# Patient Record
Sex: Female | Born: 1970 | Race: Black or African American | Hispanic: No | Smoking: Current some day smoker
Health system: Southern US, Community
[De-identification: ages and names within clinical notes are randomized; demographics above are authoritative.]

## PROBLEM LIST (undated history)

## (undated) DIAGNOSIS — I2510 Atherosclerotic heart disease of native coronary artery without angina pectoris: Secondary | ICD-10-CM

## (undated) DIAGNOSIS — Z7901 Long term (current) use of anticoagulants: Secondary | ICD-10-CM

## (undated) DIAGNOSIS — N390 Urinary tract infection, site not specified: Secondary | ICD-10-CM

## (undated) DIAGNOSIS — I255 Ischemic cardiomyopathy: Secondary | ICD-10-CM

## (undated) DIAGNOSIS — I739 Peripheral vascular disease, unspecified: Secondary | ICD-10-CM

## (undated) HISTORY — DX: Ischemic cardiomyopathy: I25.5

## (undated) HISTORY — DX: Atherosclerotic heart disease of native coronary artery without angina pectoris: I25.10

## (undated) HISTORY — DX: Peripheral vascular disease, unspecified: I73.9

---

## 2009-01-09 ENCOUNTER — Emergency Department (HOSPITAL_COMMUNITY): Admission: EM | Admit: 2009-01-09 | Discharge: 2009-01-09 | Payer: Self-pay | Admitting: Emergency Medicine

## 2015-07-31 ENCOUNTER — Inpatient Hospital Stay (HOSPITAL_COMMUNITY)
Admission: EM | Admit: 2015-07-31 | Discharge: 2015-08-04 | DRG: 391 | Disposition: A | Discharge status: Home / Self Care | Payer: Self-pay | Attending: Internal Medicine | Admitting: Internal Medicine

## 2015-07-31 ENCOUNTER — Encounter (HOSPITAL_COMMUNITY): Payer: Self-pay | Admitting: *Deleted

## 2015-07-31 DIAGNOSIS — I255 Ischemic cardiomyopathy: Secondary | ICD-10-CM | POA: Diagnosis present

## 2015-07-31 DIAGNOSIS — Z720 Tobacco use: Secondary | ICD-10-CM | POA: Diagnosis present

## 2015-07-31 DIAGNOSIS — I253 Aneurysm of heart: Secondary | ICD-10-CM | POA: Diagnosis present

## 2015-07-31 DIAGNOSIS — Z6828 Body mass index (BMI) 28.0-28.9, adult: Secondary | ICD-10-CM

## 2015-07-31 DIAGNOSIS — K5289 Other specified noninfective gastroenteritis and colitis: Principal | ICD-10-CM | POA: Diagnosis present

## 2015-07-31 DIAGNOSIS — I252 Old myocardial infarction: Secondary | ICD-10-CM

## 2015-07-31 DIAGNOSIS — I513 Intracardiac thrombosis, not elsewhere classified: Secondary | ICD-10-CM | POA: Diagnosis present

## 2015-07-31 DIAGNOSIS — R1010 Upper abdominal pain, unspecified: Secondary | ICD-10-CM

## 2015-07-31 DIAGNOSIS — R7989 Other specified abnormal findings of blood chemistry: Secondary | ICD-10-CM | POA: Diagnosis present

## 2015-07-31 DIAGNOSIS — R945 Abnormal results of liver function studies: Secondary | ICD-10-CM

## 2015-07-31 DIAGNOSIS — R1013 Epigastric pain: Secondary | ICD-10-CM | POA: Diagnosis present

## 2015-07-31 DIAGNOSIS — F17200 Nicotine dependence, unspecified, uncomplicated: Secondary | ICD-10-CM | POA: Diagnosis present

## 2015-07-31 DIAGNOSIS — E669 Obesity, unspecified: Secondary | ICD-10-CM | POA: Diagnosis present

## 2015-07-31 DIAGNOSIS — I2542 Coronary artery dissection: Secondary | ICD-10-CM | POA: Diagnosis present

## 2015-07-31 DIAGNOSIS — F1290 Cannabis use, unspecified, uncomplicated: Secondary | ICD-10-CM | POA: Diagnosis present

## 2015-07-31 DIAGNOSIS — I2510 Atherosclerotic heart disease of native coronary artery without angina pectoris: Secondary | ICD-10-CM | POA: Diagnosis present

## 2015-07-31 LAB — COMPREHENSIVE METABOLIC PANEL
ALBUMIN: 3.7 g/dL (ref 3.5–5.0)
ALT: 138 U/L — ABNORMAL HIGH (ref 14–54)
ANION GAP: 14 (ref 5–15)
AST: 176 U/L — AB (ref 15–41)
Alkaline Phosphatase: 104 U/L (ref 38–126)
BILIRUBIN TOTAL: 1.1 mg/dL (ref 0.3–1.2)
BUN: 7 mg/dL (ref 6–20)
CALCIUM: 9.6 mg/dL (ref 8.9–10.3)
CO2: 22 mmol/L (ref 22–32)
Chloride: 98 mmol/L — ABNORMAL LOW (ref 101–111)
Creatinine, Ser: 0.71 mg/dL (ref 0.44–1.00)
GFR calc Af Amer: >60 mL/min (ref >60)
GFR calc non Af Amer: >60 mL/min (ref >60)
GLUCOSE: 110 mg/dL — AB (ref 65–99)
Potassium: 3.5 mmol/L (ref 3.5–5.1)
Sodium: 134 mmol/L — ABNORMAL LOW (ref 135–145)
TOTAL PROTEIN: 7.5 g/dL (ref 6.5–8.1)

## 2015-07-31 LAB — LIPASE, BLOOD: LIPASE: 19 U/L (ref 11–51)

## 2015-07-31 LAB — CBC
HCT: 42 % (ref 36.0–46.0)
Hemoglobin: 14.4 g/dL (ref 12.0–15.0)
MCH: 32.5 pg (ref 26.0–34.0)
MCHC: 34.3 g/dL (ref 30.0–36.0)
MCV: 94.8 fL (ref 78.0–100.0)
PLATELETS: 249 10*3/uL (ref 150–400)
RBC: 4.43 MIL/uL (ref 3.87–5.11)
RDW: 13.9 % (ref 11.5–15.5)
WBC: 5.5 10*3/uL (ref 4.0–10.5)

## 2015-07-31 LAB — POC URINE PREG, ED: PREG TEST UR: NEGATIVE

## 2015-07-31 MED ORDER — ONDANSETRON HCL 4 MG/2ML IJ SOLN
4.0000 mg | Freq: Once | INTRAMUSCULAR | Status: AC
  Administered 2015-07-31: 4 mg via INTRAVENOUS
  Filled 2015-07-31: qty 2

## 2015-07-31 MED ORDER — SODIUM CHLORIDE 0.9 % IV BOLUS (SEPSIS)
1000.0000 mL | Freq: Once | INTRAVENOUS | Status: AC
  Administered 2015-07-31: 1000 mL via INTRAVENOUS

## 2015-07-31 MED ORDER — DICYCLOMINE HCL 10 MG/ML IM SOLN
20.0000 mg | Freq: Once | INTRAMUSCULAR | Status: AC
  Administered 2015-07-31: 20 mg via INTRAMUSCULAR
  Filled 2015-07-31: qty 2

## 2015-07-31 NOTE — ED Notes (Signed)
Patient presents with c/o abd pain and emesis

## 2015-07-31 NOTE — ED Provider Notes (Signed)
CSN: [id number]     Arrival date & time 07/31/15  2139 History   First MD Initiated Contact with Patient 07/31/15 2243     Chief Complaint  Patient presents with  . Abdominal Pain  . Emesis     (Consider location/radiation/quality/duration/timing/severity/associated sxs/prior Treatment) HPI Comments: 45 year old female with no significant past medical history presents to the emergency department for evaluation of constant abdominal pain which has been waxing and waning over the past 3 days. She states that pain began on Friday morning and was associated with one episode of emesis. She had 2 episodes of emesis yesterday as well as one episode of emesis this morning. Patient states that she has been having normal bowel movements, the last of which was yesterday. She took an Aleve for symptoms without relief. She describes the pain as aching. Patient reports that her child was sick with the stomach flu a few days ago. She has had no fever, hematemesis, melena, hematochezia, urinary symptoms or vaginal complaints. Abdominal surgical hx significant for C-section x 3.  Patient is a 45 y.o. female presenting with abdominal pain and vomiting. The history is provided by the patient. No language interpreter was used.  Abdominal Pain Associated symptoms: nausea and vomiting   Associated symptoms: no diarrhea, no dysuria and no fever   Emesis Associated symptoms: abdominal pain   Associated symptoms: no diarrhea     History reviewed. No pertinent past medical history. History reviewed. No pertinent past surgical history. No family history on file. Social History  Substance Use Topics  . Smoking status: Current Every Day Smoker  . Smokeless tobacco: Never Used  . Alcohol Use: Yes   OB History    No data available      Review of Systems  Constitutional: Negative for fever.  Gastrointestinal: Positive for nausea, vomiting and abdominal pain. Negative for diarrhea and blood in stool.   Genitourinary: Negative for dysuria.  All other systems reviewed and are negative.   Allergies  Review of patient's allergies indicates no known allergies.  Home Medications   Prior to Admission medications   Medication Sig Start Date End Date Taking? Authorizing Provider  naproxen sodium (ANAPROX) 220 MG tablet Take 220 mg by mouth 2 (two) times daily as needed (for pain).   Yes Historical Provider, MD   BP 139/89 mmHg  Pulse 98  Temp(Src) 98.2 F (36.8 C) (Oral)  Resp 18  Ht 5\' 5"  (1.651 m)  Wt 73.936 kg  BMI 27.12 kg/m2  SpO2 99%  LMP 07/31/2015   Physical Exam  Constitutional: She is oriented to person, place, and time. She appears well-developed and well-nourished. No distress.  Nontoxic appearing  HENT:  Head: Normocephalic and atraumatic.  Eyes: Conjunctivae and EOM are normal. No scleral icterus.  Neck: Normal range of motion.  Cardiovascular: Normal rate, regular rhythm and intact distal pulses.   Patient not tachycardic as noted in triage  Pulmonary/Chest: Effort normal and breath sounds normal. No respiratory distress. She has no wheezes. She has no rales.  Respirations even and unlabored. Lungs clear.  Abdominal: Soft. She exhibits no distension. There is tenderness. There is no rebound.  Soft abdomen with tenderness to palpation in the right upper quadrant and right lower quadrant. Tenderness also noted in the epigastric abdomen. Negative Murphy sign appreciated. No rigidity, guarding, or peritoneal signs.  Musculoskeletal: Normal range of motion.  Neurological: She is alert and oriented to person, place, and time. She exhibits normal muscle tone. Coordination normal.  Skin: Skin  is warm and dry. No rash noted. She is not diaphoretic. No erythema. No pallor.  Psychiatric: She has a normal mood and affect. Her behavior is normal.  Nursing note and vitals reviewed.   ED Course  Procedures (including critical care time) Labs Review Labs Reviewed   COMPREHENSIVE METABOLIC PANEL - Abnormal; Notable for the following:    Sodium 134 (*)    Chloride 98 (*)    Glucose, Bld 110 (*)    AST 176 (*)    ALT 138 (*)    All other components within normal limits  LIPASE, BLOOD  CBC  URINALYSIS, ROUTINE W REFLEX MICROSCOPIC (NOT AT [facility])  POC URINE PREG, ED    Imaging Review Ct Abdomen Pelvis W Contrast  08/01/2015  CLINICAL DATA:  Right-sided flank pain and abdominal pain since Friday. Vomiting for 3 days. Constipation. EXAM: CT ABDOMEN AND PELVIS WITH CONTRAST TECHNIQUE: Multidetector CT imaging of the abdomen and pelvis was performed using the standard protocol following bolus administration of intravenous contrast. CONTRAST:  ISOVUE-300 IOPAMIDOL (ISOVUE-300) INJECTION 61% COMPARISON:  None. FINDINGS: The lung bases are clear. Expansion of the left cardiac apex with thinning in calcification in the wall suggesting left ventricular aneurysm. Focal filling defect consistent with thrombus. Moderate diffuse fatty infiltration of the liver. The gallbladder, pancreas, spleen, adrenal glands, kidneys, abdominal aorta, inferior vena cava, and retroperitoneal lymph nodes are unremarkable. Stomach, small bowel, and colon are not abnormally distended. No free air or free fluid in the abdomen. Abdominal wall musculature appears intact. Pelvis: The appendix is normal. Uterus and ovaries are not enlarged. No free or loculated pelvic fluid collections. No pelvic mass or lymphadenopathy. No destructive bone lesions. IMPRESSION: Left ventricular aneurysm containing thrombus. Diffuse fatty infiltration of the liver. Normal appendix. Electronically Signed   By: Burman Nieves M.D.   On: 08/01/2015 01:03     I have personally reviewed and evaluated these images and lab results as part of my medical decision-making.   EKG Interpretation   Date/Time:  Monday Aug 01 2015 02:44:16 EDT Ventricular Rate:  99 PR Interval:  170 QRS Duration: 81 QT Interval:   378 QTC Calculation: 485 R Axis:   58 Text Interpretation:  Sinus rhythm Probable anterolateral infarct, age  indeterm non-specific ST T changes Confirmed by [facility]  MD, APRIL  ([id number]) on 08/01/2015 2:48:00 AM        EMERGENCY DEPARTMENT BILIARY ULTRASOUND INTERPRETATION "Study: Limited Abdominal Ultrasound of the gallbladder and common bile duct."  INDICATIONS: RUQ pain Indication: Multiple views of the gallbladder and common bile duct were obtained in real-time with a Multi-frequency probe." PERFORMED BY:  Myself with MD Zavitz supervising IMAGES ARCHIVED?: Yes FINDINGS: Gallstones absent, Gallbladder wall normal in thickness and Common bile duct normal in size LIMITATIONS: Bowel Gas INTERPRETATION: Normal   1:54 AM Case discussed with Dr. Toniann Fail who is happy to admit. He requests consult to cardiology to discuss risks/benefits of heparinization given that there is also risk for aneurysm rupture. Will consult cardiology.  2:35 AM Dr. Donnie Aho has seen the patient in the ED in consult  MDM   Final diagnoses:  Left ventricular aneurysm  Left ventricular thrombus (HCC)  Pain of upper abdomen  Elevated LFTs    45 year old female presents to the emergency department for evaluation of abdominal pain and vomiting. She was found to have mildly elevated LFTs with tenderness to palpation in her right upper and right lower quadrants. She also had epigastric tenderness to palpation. Bedside ultrasound performed  with Dr. Jed Limerick. Ultrasound does not show any evidence of cholelithiasis or acute cholecystitis. CT abdomen pelvis ordered for further workup. There is no emergent findings noted in the abdomen or pelvis, though incidental finding of left ventricular aneurysm noted along with thrombus.  Patient to be admitted to the hospitalist service for further management. Cardiology consulted and Dr. Donnie Aho has evaluated the patient at bedside as well.   Filed Vitals:   08/01/15  0130 08/01/15 0145 08/01/15 0200 08/01/15 0230  BP: 144/77 142/96 116/74 128/88  Pulse: 106 98 94 98  Temp:      TempSrc:      Resp:      Height:      Weight:      SpO2: 100% 100% 98% 99%     Antony Madura, PA-C 08/01/15 1610  April Palumbo, MD 08/01/15 [phone]

## 2015-08-01 ENCOUNTER — Observation Stay (HOSPITAL_COMMUNITY): Payer: Self-pay

## 2015-08-01 ENCOUNTER — Emergency Department (HOSPITAL_COMMUNITY): Payer: Self-pay

## 2015-08-01 ENCOUNTER — Encounter (HOSPITAL_COMMUNITY): Payer: Self-pay | Admitting: Radiology

## 2015-08-01 DIAGNOSIS — I513 Intracardiac thrombosis, not elsewhere classified: Secondary | ICD-10-CM | POA: Insufficient documentation

## 2015-08-01 DIAGNOSIS — R945 Abnormal results of liver function studies: Secondary | ICD-10-CM

## 2015-08-01 DIAGNOSIS — F17200 Nicotine dependence, unspecified, uncomplicated: Secondary | ICD-10-CM | POA: Diagnosis present

## 2015-08-01 DIAGNOSIS — R7989 Other specified abnormal findings of blood chemistry: Secondary | ICD-10-CM | POA: Diagnosis present

## 2015-08-01 DIAGNOSIS — R1013 Epigastric pain: Secondary | ICD-10-CM

## 2015-08-01 DIAGNOSIS — R9439 Abnormal result of other cardiovascular function study: Secondary | ICD-10-CM

## 2015-08-01 DIAGNOSIS — Z720 Tobacco use: Secondary | ICD-10-CM | POA: Diagnosis present

## 2015-08-01 DIAGNOSIS — I252 Old myocardial infarction: Secondary | ICD-10-CM

## 2015-08-01 LAB — TROPONIN I
TROPONIN I: 0.05 ng/mL — AB (ref <0.031)
Troponin I: 0.04 ng/mL — ABNORMAL HIGH (ref <0.031)
Troponin I: 0.03 ng/mL (ref <0.031)

## 2015-08-01 LAB — CBC WITH DIFFERENTIAL/PLATELET
Basophils Absolute: 0 10*3/uL (ref 0.0–0.1)
Basophils Relative: 0 %
Eosinophils Absolute: 0.1 10*3/uL (ref 0.0–0.7)
Eosinophils Relative: 2 %
HEMATOCRIT: 38.8 % (ref 36.0–46.0)
HEMOGLOBIN: 13.2 g/dL (ref 12.0–15.0)
LYMPHS ABS: 3.1 10*3/uL (ref 0.7–4.0)
Lymphocytes Relative: 51 %
MCH: 32.3 pg (ref 26.0–34.0)
MCHC: 34 g/dL (ref 30.0–36.0)
MCV: 94.9 fL (ref 78.0–100.0)
MONO ABS: 0.4 10*3/uL (ref 0.1–1.0)
MONOS PCT: 6 %
NEUTROS ABS: 2.4 10*3/uL (ref 1.7–7.7)
NEUTROS PCT: 41 %
Platelets: 217 10*3/uL (ref 150–400)
RBC: 4.09 MIL/uL (ref 3.87–5.11)
RDW: 13.9 % (ref 11.5–15.5)
WBC: 6 10*3/uL (ref 4.0–10.5)

## 2015-08-01 LAB — LIPASE, BLOOD: LIPASE: 27 U/L (ref 11–51)

## 2015-08-01 LAB — BASIC METABOLIC PANEL
ANION GAP: 9 (ref 5–15)
BUN: 6 mg/dL (ref 6–20)
CHLORIDE: 103 mmol/L (ref 101–111)
CO2: 23 mmol/L (ref 22–32)
Calcium: 8.8 mg/dL — ABNORMAL LOW (ref 8.9–10.3)
Creatinine, Ser: 0.72 mg/dL (ref 0.44–1.00)
GFR calc non Af Amer: >60 mL/min (ref >60)
Glucose, Bld: 90 mg/dL (ref 65–99)
Potassium: 4 mmol/L (ref 3.5–5.1)
Sodium: 135 mmol/L (ref 135–145)

## 2015-08-01 LAB — HEPATIC FUNCTION PANEL
ALBUMIN: 3 g/dL — AB (ref 3.5–5.0)
ALT: 109 U/L — AB (ref 14–54)
AST: 151 U/L — AB (ref 15–41)
Alkaline Phosphatase: 86 U/L (ref 38–126)
BILIRUBIN DIRECT: 0.5 mg/dL (ref 0.1–0.5)
BILIRUBIN TOTAL: 1.3 mg/dL — AB (ref 0.3–1.2)
Indirect Bilirubin: 0.8 mg/dL (ref 0.3–0.9)
Total Protein: 6.4 g/dL — ABNORMAL LOW (ref 6.5–8.1)

## 2015-08-01 LAB — HEPARIN LEVEL (UNFRACTIONATED): HEPARIN UNFRACTIONATED: 0.35 [IU]/mL (ref 0.30–0.70)

## 2015-08-01 LAB — I-STAT TROPONIN, ED: Troponin i, poc: 0.04 ng/mL (ref 0.00–0.08)

## 2015-08-01 LAB — RAPID URINE DRUG SCREEN, HOSP PERFORMED
Amphetamines: NOT DETECTED
BENZODIAZEPINES: NOT DETECTED
Barbiturates: NOT DETECTED
Cocaine: NOT DETECTED
Opiates: NOT DETECTED
Tetrahydrocannabinol: POSITIVE — AB

## 2015-08-01 MED ORDER — ONDANSETRON HCL 4 MG/2ML IJ SOLN
4.0000 mg | Freq: Four times a day (QID) | INTRAMUSCULAR | Status: DC | PRN
  Administered 2015-08-02: 4 mg via INTRAVENOUS
  Filled 2015-08-01: qty 2

## 2015-08-01 MED ORDER — METOPROLOL SUCCINATE ER 25 MG PO TB24
25.0000 mg | ORAL_TABLET | Freq: Every day | ORAL | Status: DC
  Administered 2015-08-01 – 2015-08-04 (×4): 25 mg via ORAL
  Filled 2015-08-01 (×4): qty 1

## 2015-08-01 MED ORDER — HEPARIN (PORCINE) IN NACL 100-0.45 UNIT/ML-% IJ SOLN
1000.0000 [IU]/h | INTRAMUSCULAR | Status: DC
  Administered 2015-08-01: 1100 [IU]/h via INTRAVENOUS
  Administered 2015-08-02: 1150 [IU]/h via INTRAVENOUS
  Administered 2015-08-04: 1000 [IU]/h via INTRAVENOUS
  Filled 2015-08-01 (×4): qty 250

## 2015-08-01 MED ORDER — ONDANSETRON HCL 4 MG PO TABS: 4.0000 mg | ORAL_TABLET | Freq: Four times a day (QID) | ORAL | Status: DC | PRN

## 2015-08-01 MED ORDER — ASPIRIN EC 325 MG PO TBEC
325.0000 mg | DELAYED_RELEASE_TABLET | Freq: Every day | ORAL | Status: DC
  Administered 2015-08-01 – 2015-08-04 (×4): 325 mg via ORAL
  Filled 2015-08-01 (×4): qty 1

## 2015-08-01 MED ORDER — ACETAMINOPHEN 325 MG PO TABS
650.0000 mg | ORAL_TABLET | Freq: Four times a day (QID) | ORAL | Status: DC | PRN
Start: 2015-08-01 — End: 2015-08-01

## 2015-08-01 MED ORDER — ACETAMINOPHEN 650 MG RE SUPP: 650.0000 mg | Freq: Four times a day (QID) | RECTAL | Status: DC | PRN

## 2015-08-01 MED ORDER — IOPAMIDOL (ISOVUE-300) INJECTION 61%
INTRAVENOUS | Status: AC
  Administered 2015-08-01: 100 mL
  Filled 2015-08-01: qty 100

## 2015-08-01 MED ORDER — PANTOPRAZOLE SODIUM 40 MG IV SOLR
40.0000 mg | INTRAVENOUS | Status: DC
Start: 2015-08-01 — End: 2015-08-03
  Administered 2015-08-01 – 2015-08-03 (×3): 40 mg via INTRAVENOUS
  Filled 2015-08-01 (×4): qty 40

## 2015-08-01 MED ORDER — HEPARIN BOLUS VIA INFUSION
3000.0000 [IU] | Freq: Once | INTRAVENOUS | Status: AC
  Administered 2015-08-01: 3000 [IU] via INTRAVENOUS
  Filled 2015-08-01: qty 3000

## 2015-08-01 NOTE — ED Notes (Signed)
Pt speaking on phone 

## 2015-08-01 NOTE — ED Notes (Signed)
Attempoted to call report  

## 2015-08-01 NOTE — ED Notes (Signed)
Patient transported to CT 

## 2015-08-01 NOTE — Progress Notes (Signed)
  Aug 01, 2015  Patient: [name]  Date of Birth: 1970/12/12  Date of Visit: 07/31/2015    To Whom It May Concern:  Teigan Carver was admitted to the hospital on 07/31/2015. [name] is Casey [name]'s mother.   Sincerely,  Kandice Hams, RN

## 2015-08-01 NOTE — ED Notes (Signed)
Portable chest xray at bedside.

## 2015-08-01 NOTE — Progress Notes (Signed)
Patient refused bed alarm.    

## 2015-08-01 NOTE — Consult Note (Signed)
Cardiology Consult Note  Admit date: 07/31/2015 Name: Casey [name] 45 y.o.  female DOB:  Sep 24, 1970 MRN:  [id number]  Today's date:  08/01/2015  Referring Physician:   [facility]  Reason for Consultation:    Incidental finding of cardiac aneurysm on CT scan   IMPRESSIONS: 1.  Evidence of anterior myocardial infarction by EKG which appears old by EKG and also confirmed by CT scan done for abdominal pain showing a thin calcified apex with what appears to be thrombus in it.  She has absolutely no history of a clinical event that could've been a previous infarction and her lifetime.  She normally feels well without cardiovascular symptoms.  The troponin that is low points against a recent event 2.  Tobacco abuse 3.  Mild obesity 4.  Abdominal pain with elevated liver enzymes and right upper quadrant tenderness possible cholecystitis  RECOMMENDATION: 1.  She has what looks like a laminar thrombus on her CT scan.  It's reasonable to heparinize her this evening and we will look at  an echo tomorrow. 2.  Go ahead and cycle cardiac enzymes although I suspect this is an old event because of the calcification that is seen 3.  Low-dose aspirin.  Continue to work  abdominal pain and follow liver enzymes. 4.  Begin low-dose beta blocker in the morning and await echo.    HISTORY: This 45 year old female presented to the emergency room with a three-day history of the abdominal pain which is waxed and waned over the past 3 days.  The pain began Friday and was associated with emesis.  She had vomiting yesterday and again today with the last episode being around 3 PM.  She has taken Aleve and describes an aching symptoms worsen the right upper quadrant.  Her child evidently had stomach flu a few days ago.  She had no chest pressure and did not have any shortness of breath.  She has not had any prior history lifetime of any symptoms that would suggest previous infarction.  She normally is able  to work and denies any symptoms of chest pain angina or shortness of breath.  History reviewed. No pertinent past medical history.    Past Surgical History  Procedure Laterality Date  . Cesarean section      Allergies:  has No Known Allergies.   Medications: Prior to Admission medications   Medication Sig Start Date End Date Taking? Authorizing Provider  naproxen sodium (ANAPROX) 220 MG tablet Take 220 mg by mouth 2 (two) times daily as needed (for pain).   Yes Historical Provider, MD   Family History: Family Status  Relation Status Death Age  . Father      Health unknown  . Mother Deceased 52    Asthma  . Brother Alive   . Brother Alive   . Brother Alive   . Sister Alive   . Sister Alive    Social History:   reports that she has been smoking.  She has never used smokeless tobacco. She reports that she drinks alcohol. She reports that she does not use illicit drugs.   Divorced, has 4 children and one still lives with her with her 2 grandkids.  She works at [employer] work  Review of Systems: Other than as noted above the remainder of the review of systems is unremarkable  Physical Exam: BP 128/88 mmHg  Pulse 98  Temp(Src) 98.2 F (36.8 C) (Oral)  Resp 18  Ht  (1.651  m)  Wt 73.936 kg (163 lb)  BMI 27.12 kg/m2  SpO2 99%  LMP 07/31/2015  General appearance: She is a mildly obese black female yawning frequently not in any acute distress Head: Normocephalic, without obvious abnormality, atraumatic Eyes: conjunctivae/corneas clear. PERRL, EOM's intact. Fundi not examined  Neck: no adenopathy, no carotid bruit, no JVD and supple, symmetrical, trachea midline Lungs: clear to auscultation bilaterally Heart: regular rate and rhythm, S1, S2 normal, no murmur, click, rub or gallop Abdomen: Thousand's preserved, right upper quadrant point tenderness is present Pelvic: deferred Extremities: extremities normal, atraumatic, no cyanosis or edema Pulses:  2+ and symmetric Skin: Skin color, texture, turgor normal. No rashes or lesions Neurologic: Grossly normal   Labs: CBC  Recent Labs  07/31/15 2201  WBC 5.5  RBC 4.43  HGB 14.4  HCT 42.0  PLT 249  MCV 94.8  MCH 32.5  MCHC 34.3  RDW 13.9   CMP   Recent Labs  07/31/15 2201  NA 134*  K 3.5  CL 98*  CO2 22  GLUCOSE 110*  BUN 7  CREATININE 0.71  CALCIUM 9.6  PROT 7.5  ALBUMIN 3.7  AST 176*  ALT 138*  ALKPHOS 104  BILITOT 1.1  GFRNONAA >60  GFRAA >60   BNP (last 3 results) Cardiac Panel (last 3 results) Troponin (Point of Care Test)  Recent Labs  08/01/15 0216  TROPIPOC 0.04     Radiology: CT scan of the pelvis shows an incidental finding of a calcified LV aneurysm with what appears to be a laminated thrombus in it.  EKG: Sinus rhythm with an old anterior infarction  Signed:  W. Ashley Royalty MD [facility]   Cardiology Consultant  08/01/2015, 3:14 AM

## 2015-08-01 NOTE — Progress Notes (Signed)
ANTICOAGULATION CONSULT NOTE Pharmacy Consult for Heparin Indication: LV thrombus  No Known Allergies  Patient Measurements: Height: 5\' 4"  (162.6 cm) Weight: 163 lb 3.2 oz (74.027 kg) IBW/kg (Calculated) : 54.7 Heparin Dosing Weight: 70 kg  Vital Signs: Temp: 98.2 F (36.8 C) (05/01 1157) Temp Source: Oral (05/01 1157) BP: 124/66 mmHg (05/01 1157) Pulse Rate: 83 (05/01 1157)  Labs:  Recent Labs  07/31/15 2201 08/01/15 0735 08/01/15 1144 08/01/15 1226  HGB 14.4 13.2  --   --   HCT 42.0 38.8  --   --   PLT 249 217  --   --   HEPARINUNFRC  --   --   --  0.35  CREATININE 0.71 0.72  --   --   TROPONINI  --  0.04* 0.03  --     Estimated Creatinine Clearance: 88.4 mL/min (by C-G formula based on Cr of 0.72).   Medical History: History reviewed. No pertinent past medical history.  Medications:  Naproxen   Assessment: 45 y.o. female with abdominal pain, foound to have LV aneurysm/throbus, for heparin Initial HL = 0.35  Goal of Therapy:  Heparin level 0.3-0.7 units/ml Monitor platelets by anticoagulation protocol: Yes   Plan:  Increase heparin to 1200 units / hr Follow up AM labs  Thank you Okey Regal, PharmD [phone] 08/01/2015,1:25 PM

## 2015-08-01 NOTE — Progress Notes (Signed)
Patient Name: Casey FRANZEL Date of Encounter: 08/01/2015  Principal Problem:   Epigastric pain Active Problems:   Old anterior myocardial infarction   Tobacco abuse   Elevated LFTs   LV (left ventricular) mural thrombus [facility])   Primary Cardiologist: New  Patient Profile: 45 yo female w/ no known CRFs was admitted 05/01 w/ abd pain, cards seeing for abnl abd CT > calcified LV aneurysm with what appears to be a laminated thrombus. Echo ordered, ez neg so far.   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 Pt took ASA, did not seek medical attention. Otherwise, no hx CP/SOB  OBJECTIVE Filed Vitals:   08/01/15 0415 08/01/15 0430 08/01/15 0600 08/01/15 0630  BP: 126/86 125/87 116/76 116/84  Pulse: 83 97 91 90  Temp:      TempSrc:      Resp: 24 17 16 17   Height:      Weight:      SpO2: 100% 97% 97% 100%    Intake/Output Summary (Last 24 hours) at 08/01/15 0801 Last data filed at 08/01/15 0153  Gross per 24 hour  Intake   1000 ml  Output      0 ml  Net   1000 ml   Filed Weights   07/31/15 2149  Weight: 163 lb (73.936 kg)    PHYSICAL EXAM General: Well developed, well nourished, female in no acute distress. Head: Normocephalic, atraumatic.  Neck: Supple without bruits, JVD. Lungs:  Resp regular and unlabored, CTA. Heart: RRR, S1, S2, no S3, S4, or murmur; no rub. Abdomen: Soft, non-tender, non-distended, BS + x 4.  Extremities: No clubbing, cyanosis, edema.  Neuro: Alert and oriented X 3. Moves all extremities spontaneously. Psych: Normal affect.  LABS: CBC: Recent Labs  07/31/15 2201  WBC 5.5  HGB 14.4  HCT 42.0  MCV 94.8  PLT 249   INR:No results for input(s): INR in the last 72 hours. Basic Metabolic Panel: Recent Labs  07/31/15 2201  NA 134*  K 3.5  CL 98*  CO2 22  GLUCOSE 110*  BUN 7  CREATININE 0.71  CALCIUM 9.6   Liver Function Tests: Recent Labs  07/31/15 2201  AST  176*  ALT 138*  ALKPHOS 104  BILITOT 1.1  PROT 7.5  ALBUMIN 3.7    Recent Labs  08/01/15 0216  TROPIPOC 0.04    Radiology/Studies: US Abdomen Complete  08/01/2015  CLINICAL DATA:  Abdominal pain and elevated liver function tests. EXAM: ABDOMEN ULTRASOUND COMPLETE COMPARISON:  CT abdomen and pelvis Aug 01, 2015 at 0045 hours FINDINGS: Gallbladder: No gallstones or wall thickening visualized. No sonographic Murphy sign noted by sonographer. Common bile duct: Diameter: 4 mm Liver: Diffusely echogenic without intrahepatic biliary dilatation. Hepatopetal portal vein. IVC: No abnormality visualized. Pancreas: Visualized portion unremarkable. Spleen: Size and appearance within normal limits. Right Kidney: Length: 10.6 cm. Echogenicity within normal limits. No mass or hydronephrosis visualized. Left Kidney: Length: 10.3 cm. Echogenicity within normal limits. No mass or hydronephrosis visualized. Abdominal aorta: No aneurysm visualized. Other findings: None. IMPRESSION: Hepatic steatosis.  Otherwise negative abdominal ultrasound. Electronically Signed   By: Awilda Metro M.D.   On: 08/01/2015 05:20   Ct Abdomen Pelvis W Contrast  08/01/2015  CLINICAL DATA:  Right-sided flank pain and abdominal pain since Friday. Vomiting for 3 days. Constipation. EXAM: CT ABDOMEN AND PELVIS WITH CONTRAST TECHNIQUE: Multidetector CT imaging of the abdomen and pelvis was performed using  the standard protocol following bolus administration of intravenous contrast. CONTRAST:  ISOVUE-300 IOPAMIDOL (ISOVUE-300) INJECTION 61% COMPARISON:  None. FINDINGS: The lung bases are clear. Expansion of the left cardiac apex with thinning in calcification in the wall suggesting left ventricular aneurysm. Focal filling defect consistent with thrombus. Moderate diffuse fatty infiltration of the liver. The gallbladder, pancreas, spleen, adrenal glands, kidneys, abdominal aorta, inferior vena cava, and retroperitoneal lymph nodes are  unremarkable. Stomach, small bowel, and colon are not abnormally distended. No free air or free fluid in the abdomen. Abdominal wall musculature appears intact. Pelvis: The appendix is normal. Uterus and ovaries are not enlarged. No free or loculated pelvic fluid collections. No pelvic mass or lymphadenopathy. No destructive bone lesions. IMPRESSION: Left ventricular aneurysm containing thrombus. Diffuse fatty infiltration of the liver. Normal appendix. Electronically Signed   By: Burman Nieves M.D.   On: 08/01/2015 01:03   Dg Chest Port 1 View  08/01/2015  CLINICAL DATA:  Abdominal pain and vomiting. EXAM: PORTABLE CHEST 1 VIEW COMPARISON:  None. FINDINGS: The heart size and mediastinal contours are within normal limits. Both lungs are clear. The visualized skeletal structures are unremarkable. IMPRESSION: No active disease. Electronically Signed   By: Burman Nieves M.D.   On: 08/01/2015 05:55     Current Medications:  . aspirin EC  325 mg Oral Daily  . metoprolol succinate  25 mg Oral Daily  . pantoprazole (PROTONIX) IV  40 mg Intravenous Q24H   . heparin 1,100 Units/hr (08/01/15 0440)    ASSESSMENT AND PLAN:  1.   LV (left ventricular) mural thrombus (HCC) - await echo, continue heparin, ASA, BB  Otherwise, per IM Principal Problem:   Epigastric pain Active Problems:   Old anterior myocardial infarction   Tobacco abuse   Elevated LFTs    Signed, Barrett, Rhonda , PA-C 8:01 AM 08/01/2015 As above, patient seen and examined. She presented with epigastric and right upper quadrant pain. CT showed mural thrombus at the LV apex. Her electrocardiogram suggests prior anterior infarct. Plan echocardiogram today. If LV function is abnormal she will likely require cardiac catheterization. Continue heparin. Her enzymes do not suggest a recent event. Her presenting symptoms seem most consistent with cholecystitis. Further  Evaluation and management per primary care. Olga Millers

## 2015-08-01 NOTE — Progress Notes (Signed)
Attemped report back to nurse. My callback number is [phone].

## 2015-08-01 NOTE — ED Notes (Signed)
Patient transported to Ultrasound 

## 2015-08-01 NOTE — Progress Notes (Signed)
ANTICOAGULATION CONSULT NOTE - Initial Consult  Pharmacy Consult for Heparin Indication: LV thrombus  No Known Allergies  Patient Measurements: Height: 5\' 5"  (165.1 cm) Weight: 163 lb (73.936 kg) IBW/kg (Calculated) : 57 Heparin Dosing Weight: 70 kg  Vital Signs: Temp: 98.2 F (36.8 C) (04/30 2146) Temp Source: Oral (04/30 2146) BP: 128/88 mmHg (05/01 0230) Pulse Rate: 98 (05/01 0230)  Labs:  Recent Labs  07/31/15 2201  HGB 14.4  HCT 42.0  PLT 249  CREATININE 0.71    Estimated Creatinine Clearance: 90.4 mL/min (by C-G formula based on Cr of 0.71).   Medical History: History reviewed. No pertinent past medical history.  Medications:  Naproxen   Assessment: 45 y.o. female with abdominal pain, foound to have LV aneurysm/throbus, for heparin  Goal of Therapy:  Heparin level 0.3-0.7 units/ml Monitor platelets by anticoagulation protocol: Yes   Plan:  Heparin 3000 units IV bolus, then start heparin 1100 units/hr Check heparin level in 6 hours.   Eddie Candle 08/01/2015,4:04 AM

## 2015-08-01 NOTE — H&P (Addendum)
History and Physical    Casey [name] KYH:[id number] DOB: July 05, 1970 DOA: 07/31/2015  Referring MD/NP/PA: Ms. Antony Madura. PCP: No primary care provider on file.  Outpatient Specialists: None. Patient coming from: Home.  Chief Complaint: Epigastric pain.  HPI: Casey [name] is a 45 y.o. female with medical history significant of with no significant past medical history presents to the ER because of epigastric pain. Patient has been having epigastric pain with nausea and vomiting over the last 3 days. Patient tried to eat and every time patient has been having some nausea at times with vomiting. Denies any diarrhea fever chills. Denies any chest pain or shortness of breath. CT of the abdomen and pelvis done in the ER shows LV thrombus with aneurysm. On-call cardiologist Dr. Tresa Endo was consulted for the LV thrombus and aneurysm. Patient's labs also revealed elevated LFTs. Patient states on arrival patient had epigastric tenderness and on my exam tenderness is at this time resolved after patient received medications in the ER. Patient will be admitted for further management of the epigastric pain with elevated LFTs and LV thrombus.   ED Course: Patient is a pain relief medications in the ER. Was seen by cardiologist.  Review of Systems: As per HPI otherwise 10 point review of systems negative.    History reviewed. No pertinent past medical history.  Past Surgical History  Procedure Laterality Date  . Cesarean section       reports that she has been smoking.  She has never used smokeless tobacco. She reports that she drinks alcohol. She reports that she does not use illicit drugs.  No Known Allergies  Family History  Problem Relation Age of Onset  . Diabetes Mellitus II Neg Hx   . CAD Neg Hx     Prior to Admission medications   Medication Sig Start Date End Date Taking? Authorizing Provider  naproxen sodium (ANAPROX) 220 MG tablet Take 220 mg by mouth 2 (two) times daily  as needed (for pain).   Yes Historical Provider, MD    Physical Exam: Filed Vitals:   08/01/15 0345 08/01/15 0400 08/01/15 0415 08/01/15 0430  BP: 128/83 126/79 126/86 125/87  Pulse: 82 94 83 97  Temp:      TempSrc:      Resp: 20 17 24 17   Height:      Weight:      SpO2: 99% 97% 100% 97%      Constitutional: NAD, calm, comfortable Filed Vitals:   08/01/15 0345 08/01/15 0400 08/01/15 0415 08/01/15 0430  BP: 128/83 126/79 126/86 125/87  Pulse: 82 94 83 97  Temp:      TempSrc:      Resp: 20 17 24 17   Height:      Weight:      SpO2: 99% 97% 100% 97%   Eyes: PERRL, lids and conjunctivae normal ENMT: Mucous membranes are moist. Posterior pharynx clear of any exudate or lesions.Normal dentition.  Neck: normal, supple, no masses, no thyromegaly.No JVD appreciated. Respiratory: clear to auscultation bilaterally, no wheezing, no crackles. Normal respiratory effort. No accessory muscle use.  Cardiovascular: Regular rate and rhythm, no murmurs / rubs / gallops. No extremity edema. 2+ pedal pulses. No carotid bruits.  Abdomen: no tenderness, no masses palpated. No hepatosplenomegaly. Bowel sounds positive.  Musculoskeletal: no clubbing / cyanosis. No joint deformity upper and lower extremities. Good ROM, no contractures. Normal muscle tone.  Skin: no rashes, lesions, ulcers. No induration Neurologic: CN 2-12 grossly intact. Sensation intact, DTR  normal. Strength 5/5 in all 4.  Psychiatric: Normal judgment and insight. Alert and oriented x 3. Normal mood.    Labs on Admission: I have personally reviewed following labs and imaging studies  CBC:  Recent Labs Lab 07/31/15 2201  WBC 5.5  HGB 14.4  HCT 42.0  MCV 94.8  PLT 249   Basic Metabolic Panel:  Recent Labs Lab 07/31/15 2201  NA 134*  K 3.5  CL 98*  CO2 22  GLUCOSE 110*  BUN 7  CREATININE 0.71  CALCIUM 9.6   GFR: Estimated Creatinine Clearance: 90.4 mL/min (by C-G formula based on Cr of 0.71). Liver Function  Tests:  Recent Labs Lab 07/31/15 2201  AST 176*  ALT 138*  ALKPHOS 104  BILITOT 1.1  PROT 7.5  ALBUMIN 3.7    Recent Labs Lab 07/31/15 2201  LIPASE 19   No results for input(s): AMMONIA in the last 168 hours. Coagulation Profile: No results for input(s): INR, PROTIME in the last 168 hours. Cardiac Enzymes: No results for input(s): CKTOTAL, CKMB, CKMBINDEX, TROPONINI in the last 168 hours. BNP (last 3 results) No results for input(s): PROBNP in the last 8760 hours. HbA1C: No results for input(s): HGBA1C in the last 72 hours. CBG: No results for input(s): GLUCAP in the last 168 hours. Lipid Profile: No results for input(s): CHOL, HDL, LDLCALC, TRIG, CHOLHDL, LDLDIRECT in the last 72 hours. Thyroid Function Tests: No results for input(s): TSH, T4TOTAL, FREET4, T3FREE, THYROIDAB in the last 72 hours. Anemia Panel: No results for input(s): VITAMINB12, FOLATE, FERRITIN, TIBC, IRON, RETICCTPCT in the last 72 hours. Urine analysis: No results found for: COLORURINE, APPEARANCEUR, LABSPEC, PHURINE, GLUCOSEU, HGBUR, BILIRUBINUR, KETONESUR, PROTEINUR, UROBILINOGEN, NITRITE, LEUKOCYTESUR Sepsis Labs: (procalcitonin:4,lacticidven:4) )No results found for this or any previous visit (from the past 240 hour(s)).   Radiological Exams on Admission: Ct Abdomen Pelvis W Contrast  08/01/2015  CLINICAL DATA:  Right-sided flank pain and abdominal pain since Friday. Vomiting for 3 days. Constipation. EXAM: CT ABDOMEN AND PELVIS WITH CONTRAST TECHNIQUE: Multidetector CT imaging of the abdomen and pelvis was performed using the standard protocol following bolus administration of intravenous contrast. CONTRAST:  ISOVUE-300 IOPAMIDOL (ISOVUE-300) INJECTION 61% COMPARISON:  None. FINDINGS: The lung bases are clear. Expansion of the left cardiac apex with thinning in calcification in the wall suggesting left ventricular aneurysm. Focal filling defect consistent with thrombus. Moderate  diffuse fatty infiltration of the liver. The gallbladder, pancreas, spleen, adrenal glands, kidneys, abdominal aorta, inferior vena cava, and retroperitoneal lymph nodes are unremarkable. Stomach, small bowel, and colon are not abnormally distended. No free air or free fluid in the abdomen. Abdominal wall musculature appears intact. Pelvis: The appendix is normal. Uterus and ovaries are not enlarged. No free or loculated pelvic fluid collections. No pelvic mass or lymphadenopathy. No destructive bone lesions. IMPRESSION: Left ventricular aneurysm containing thrombus. Diffuse fatty infiltration of the liver. Normal appendix. Electronically Signed   By: Burman Nieves M.D.   On: 08/01/2015 01:03    EKG: Independently reviewed. Normal sinus rhythm with poor R-wave progression.  Assessment/Plan Principal Problem:   Epigastric pain Active Problems:   Old anterior myocardial infarction   Tobacco abuse   Elevated LFTs   LV (left ventricular) mural thrombus (HCC)    #1. Epigastric pain with elevated LFTs - CT scan of the abdomen shows normal gallbladder and pancreas. At this time we will repeat LFTs check acute hepatitis panel, INR. I will also place patient on Protonix and check abdominal sonogram. Check  LFTs are getting elevated may need MRCP or GI consult. We'll also be setting cardiac markers to rule out cardiac etiology given the LV thrombus. #2. LV aneurysm with thrombus - appreciate cardiology consult. As requested by cardiologist I have started patient on heparin infusion and aspirin. Patient is also on metoprolol placed by cardiologist. We will cycle cardiac markers check 2-D echo. #3. Tobacco abuse - advised to quit smoking.  Chest x-ray is pending.   DVT prophylaxis: Heparin infusion. Code Status: Full code.  Family Communication: No family at the bedside.  Disposition Plan: Home.  Consults called: Cardiologist.  Admission status: Observation. 23 hours.    Eduard Clos  MD [facility] Pager [phone].  If 7PM-7AM, please contact night-coverage [email or web address] Password [facility]  08/01/2015, 4:47 AM

## 2015-08-01 NOTE — Progress Notes (Signed)
PROGRESS NOTE                                                                                                                                                                                                             Patient Demographics:    Casey [name], is a 45 y.o. female, DOB - 11/11/1970, WUJ:[id number]  Admit date - 07/31/2015   Admitting Physician Eduard Clos, MD  Outpatient Primary MD for the patient is No primary care provider on file.  LOS -   Outpatient Specialists:none  Chief Complaint  Patient presents with  . Abdominal Pain  . Emesis       Brief Narrative    45 year old female with no prior medical history presents to the ED with epigastric pain associated with nausea and vomiting for the past 3 days. Patient was nauseous every time she ate with some episodes of vomiting. The pain was epigastric radiating to bilateral upper quadrants and then to the back. Denies eating outside or similar episodes in the past. No fevers, chills or diarrhea. Denied chest shortness of breath. Patient was found to have elevated AST and AST in high 100s. CT of the abdomen and pelvis in the ED showed LV thrombus with aneurysm. Admitted to hospital service on telemetry. Started on heparin drip as per cardiology recommendations.   Subjective:   Patient seen and examined. Admission H&P reviewed. Denies further epigastric pain, nausea or vomiting.   Assessment  & Plan :    Principal Problem:   Epigastric pain with elevated LFTs Suspect acute gastroenteritis/viral hepatitis. LFTs slowly trending down this morning. Imaging (CT and ultrasound) does not show any gallstones or positive Murphy sign. Lipase normal. Denies being on any medications or use of OCPs. Check hepatitis panel. If symptoms persistent or LFTs worsened will obtain HIDA scan versus MRCP. Continue PPI. Symptoms now resolved. Monitor with regular  diet.  LV aneurysm with thrombus Incidental finding. No other symptoms. Appreciate cardiology evaluation. 2d echo ordered. Started on IV heparin and beta blocker. Had mildly elevated troponin (0.04), no EKG shows prior anterior infarct. As per cardiology if LV function abnormal she'll likely need cardiac catheterization.    Active Problems: Tobacco abuse Counseled on cessation        Code Status : Full code  Family Communication  : None  Disposition Plan  :  Admit to telemetry. Patient be discharged home eventually  Barriers For Discharge : Active symptoms  Consults  : Cardiology  Procedures  :  CT abdomen pelvis Ultrasound abdomen 2-D echo (pending)  DVT Prophylaxis  : IV heparin  Lab Results  Component Value Date   PLT 217 08/01/2015    Antibiotics  :  None  Anti-infectives    None        Objective:   Filed Vitals:   08/01/15 1049 08/01/15 1100 08/01/15 1115 08/01/15 1157  BP: 116/92 114/71 115/79 124/66  Pulse: 96 91 91 83  Temp:    98.2 F (36.8 C)  TempSrc:    Oral  Resp: 12 16 15 18   Height:    5\' 4"  (1.626 m)  Weight:    74.027 kg (163 lb 3.2 oz)  SpO2: 99% 100% 100% 98%    Wt Readings from Last 3 Encounters:  08/01/15 74.027 kg (163 lb 3.2 oz)     Intake/Output Summary (Last 24 hours) at 08/01/15 1326 Last data filed at 08/01/15 0153  Gross per 24 hour  Intake   1000 ml  Output      0 ml  Net   1000 ml     Physical Exam  Gen: not in distress HEENT: no pallor, No icterus, moist mucosa, supple neck Chest: clear b/l, no added sounds CVS: N S1&S2, no murmurs, rubs or gallop GI: soft, NT, ND, BS+ Musculoskeletal: warm, no edema CNS: AAOX3, non focal    Data Review:    CBC  Recent Labs Lab 07/31/15 2201 08/01/15 0735  WBC 5.5 6.0  HGB 14.4 13.2  HCT 42.0 38.8  PLT 249 217  MCV 94.8 94.9  MCH 32.5 32.3  MCHC 34.3 34.0  RDW 13.9 13.9  LYMPHSABS  --  3.1  MONOABS  --  0.4  EOSABS  --  0.1  BASOSABS  --  0.0     Chemistries   Recent Labs Lab 07/31/15 2201 08/01/15 0735  NA 134* 135  K 3.5 4.0  CL 98* 103  CO2 22 23  GLUCOSE 110* 90  BUN 7 6  CREATININE 0.71 0.72  CALCIUM 9.6 8.8*  AST 176* 151*  ALT 138* 109*  ALKPHOS 104 86  BILITOT 1.1 1.3*   ------------------------------------------------------------------------------------------------------------------ No results for input(s): CHOL, HDL, LDLCALC, TRIG, CHOLHDL, LDLDIRECT in the last 72 hours.  No results found for: HGBA1C ------------------------------------------------------------------------------------------------------------------ No results for input(s): TSH, T4TOTAL, T3FREE, THYROIDAB in the last 72 hours.  Invalid input(s): FREET3 ------------------------------------------------------------------------------------------------------------------ No results for input(s): VITAMINB12, FOLATE, FERRITIN, TIBC, IRON, RETICCTPCT in the last 72 hours.  Coagulation profile No results for input(s): INR, PROTIME in the last 168 hours.  No results for input(s): DDIMER in the last 72 hours.  Cardiac Enzymes  Recent Labs Lab 08/01/15 0735 08/01/15 1144  TROPONINI 0.04* 0.03   ------------------------------------------------------------------------------------------------------------------ No results found for: BNP  Inpatient Medications  Scheduled Meds: . aspirin EC  325 mg Oral Daily  . metoprolol succinate  25 mg Oral Daily  . pantoprazole (PROTONIX) IV  40 mg Intravenous Q24H   Continuous Infusions: . heparin 1,100 Units/hr (08/01/15 0440)   PRN Meds:.acetaminophen **OR** acetaminophen, ondansetron **OR** ondansetron (ZOFRAN) IV  Micro Results No results found for this or any previous visit (from the past 240 hour(s)).  Radiology Reports US Abdomen Complete  08/01/2015  CLINICAL DATA:  Abdominal pain and elevated liver function tests. EXAM: ABDOMEN ULTRASOUND COMPLETE COMPARISON:  CT abdomen and pelvis Aug 01, 2015  at 0045 hours FINDINGS: Gallbladder: No gallstones or wall thickening visualized. No sonographic Murphy sign noted by sonographer. Common bile duct: Diameter: 4 mm Liver: Diffusely echogenic without intrahepatic biliary dilatation. Hepatopetal portal vein. IVC: No abnormality visualized. Pancreas: Visualized portion unremarkable. Spleen: Size and appearance within normal limits. Right Kidney: Length: 10.6 cm. Echogenicity within normal limits. No mass or hydronephrosis visualized. Left Kidney: Length: 10.3 cm. Echogenicity within normal limits. No mass or hydronephrosis visualized. Abdominal aorta: No aneurysm visualized. Other findings: None. IMPRESSION: Hepatic steatosis.  Otherwise negative abdominal ultrasound. Electronically Signed   By: Awilda Metro M.D.   On: 08/01/2015 05:20   Ct Abdomen Pelvis W Contrast  08/01/2015  CLINICAL DATA:  Right-sided flank pain and abdominal pain since Friday. Vomiting for 3 days. Constipation. EXAM: CT ABDOMEN AND PELVIS WITH CONTRAST TECHNIQUE: Multidetector CT imaging of the abdomen and pelvis was performed using the standard protocol following bolus administration of intravenous contrast. CONTRAST:  ISOVUE-300 IOPAMIDOL (ISOVUE-300) INJECTION 61% COMPARISON:  None. FINDINGS: The lung bases are clear. Expansion of the left cardiac apex with thinning in calcification in the wall suggesting left ventricular aneurysm. Focal filling defect consistent with thrombus. Moderate diffuse fatty infiltration of the liver. The gallbladder, pancreas, spleen, adrenal glands, kidneys, abdominal aorta, inferior vena cava, and retroperitoneal lymph nodes are unremarkable. Stomach, small bowel, and colon are not abnormally distended. No free air or free fluid in the abdomen. Abdominal wall musculature appears intact. Pelvis: The appendix is normal. Uterus and ovaries are not enlarged. No free or loculated pelvic fluid collections. No pelvic mass or lymphadenopathy. No  destructive bone lesions. IMPRESSION: Left ventricular aneurysm containing thrombus. Diffuse fatty infiltration of the liver. Normal appendix. Electronically Signed   By: Burman Nieves M.D.   On: 08/01/2015 01:03   Dg Chest Port 1 View  08/01/2015  CLINICAL DATA:  Abdominal pain and vomiting. EXAM: PORTABLE CHEST 1 VIEW COMPARISON:  None. FINDINGS: The heart size and mediastinal contours are within normal limits. Both lungs are clear. The visualized skeletal structures are unremarkable. IMPRESSION: No active disease. Electronically Signed   By: Burman Nieves M.D.   On: 08/01/2015 05:55    Time Spent in minutes  25   Eddie North M.D on 08/01/2015 at 1:26 PM  Between 7am to 7pm - Pager - [phone]  After 7pm go to [email or web address] - password [facility]  [facility] -  Office  [phone]

## 2015-08-02 ENCOUNTER — Inpatient Hospital Stay (HOSPITAL_COMMUNITY): Payer: MEDICAID

## 2015-08-02 ENCOUNTER — Other Ambulatory Visit (HOSPITAL_COMMUNITY): Payer: Self-pay

## 2015-08-02 DIAGNOSIS — I252 Old myocardial infarction: Secondary | ICD-10-CM

## 2015-08-02 DIAGNOSIS — I213 ST elevation (STEMI) myocardial infarction of unspecified site: Secondary | ICD-10-CM

## 2015-08-02 DIAGNOSIS — R7989 Other specified abnormal findings of blood chemistry: Secondary | ICD-10-CM

## 2015-08-02 LAB — HEPATIC FUNCTION PANEL
ALBUMIN: 2.9 g/dL — AB (ref 3.5–5.0)
ALK PHOS: 81 U/L (ref 38–126)
ALT: 91 U/L — AB (ref 14–54)
AST: 94 U/L — AB (ref 15–41)
BILIRUBIN TOTAL: 1.1 mg/dL (ref 0.3–1.2)
Bilirubin, Direct: 0.3 mg/dL (ref 0.1–0.5)
Indirect Bilirubin: 0.8 mg/dL (ref 0.3–0.9)
TOTAL PROTEIN: 6.1 g/dL — AB (ref 6.5–8.1)

## 2015-08-02 LAB — CBC
HEMATOCRIT: 36.2 % (ref 36.0–46.0)
HEMOGLOBIN: 12.3 g/dL (ref 12.0–15.0)
MCH: 31.4 pg (ref 26.0–34.0)
MCHC: 34 g/dL (ref 30.0–36.0)
MCV: 92.3 fL (ref 78.0–100.0)
Platelets: 222 10*3/uL (ref 150–400)
RBC: 3.92 MIL/uL (ref 3.87–5.11)
RDW: 13.8 % (ref 11.5–15.5)
WBC: 5.7 10*3/uL (ref 4.0–10.5)

## 2015-08-02 LAB — ECHOCARDIOGRAM COMPLETE
Height: 64 in
WEIGHTICAEL: 2608 [oz_av]

## 2015-08-02 LAB — HEPATITIS PANEL, ACUTE
HCV Ab: <0.1 s/co ratio (ref 0.0–0.9)
Hep A IgM: NEGATIVE
Hep B C IgM: NEGATIVE
Hepatitis B Surface Ag: NEGATIVE

## 2015-08-02 LAB — HEPARIN LEVEL (UNFRACTIONATED): HEPARIN UNFRACTIONATED: 0.72 [IU]/mL — AB (ref 0.30–0.70)

## 2015-08-02 MED ORDER — PERFLUTREN LIPID MICROSPHERE
1.0000 mL | INTRAVENOUS | Status: AC | PRN
  Administered 2015-08-02: 2 mL via INTRAVENOUS
  Filled 2015-08-02: qty 10

## 2015-08-02 NOTE — Progress Notes (Signed)
PROGRESS NOTE                                                                                                                                                                                                             Patient Demographics:    Casey [name], is a 45 y.o. female, DOB - [birth year], ZOX:[id number]  Admit date - 07/31/2015   Admitting Physician Eduard Clos, MD  Outpatient Primary MD for the patient is No primary care provider on file.  LOS - 1  Outpatient Specialists:none  Chief Complaint  Patient presents with  . Abdominal Pain  . Emesis       Brief Narrative    45 year old female with no prior medical history presents to the ED with epigastric pain associated with nausea and vomiting for the past 3 days. Patient was nauseous every time she ate with some episodes of vomiting. The pain was epigastric radiating to bilateral upper quadrants and then to the back. Denies eating outside or similar episodes in the past. No fevers, chills or diarrhea. Denied chest shortness of breath. Patient was found to have elevated AST and AST in high 100s. CT of the abdomen and pelvis in the ED showed LV thrombus with aneurysm.  Started on heparin drip as per cardiology recommendations. 2 D echo shows low EF of 35-40% with mild diffuse hypokinesis with akinesis of the mid-apical anterior wall, mid-apical anteroseptum and apex. Suggests grade 2 diastolic dysfunction. Again seen 2.8 cm (L) x 1.4 cm (W) left apicalthrombus.   Subjective:   No further GI symptoms   Assessment  & Plan :    Principal Problem:   Epigastric pain with elevated LFTs Suspect acute gastroenteritis/viral hepatitis. LFTs slowly trending down . Imaging (CT and ultrasound) does not show any gallstones or positive Murphy sign. Lipase normal. Denies being on any medications or use of OCPs. acute hepatitis panel negative.  LV aneurysm with  thrombus Incidental finding. No other symptoms. Appreciate cardiology evaluation. 2d echo with findings above ( low EF with hypokinesis). EKG shows prior anterior infarct.  Started on IV heparin and beta blocker.   Needs cardiac cath given abnormal LV function. Will keep her NPO.   Active Problems: Tobacco abuse Counseled on cessation        Code Status : Full code  Family Communication  : None  Disposition Plan  : Home once w/up complete  Barriers For Discharge : pending cardiac cath  Consults  : Cardiology  Procedures  :  CT abdomen pelvis Ultrasound abdomen 2-D echo   DVT Prophylaxis  : IV heparin  Lab Results  Component Value Date   PLT 222 08/02/2015    Antibiotics  :  None  Anti-infectives    None        Objective:   Filed Vitals:   08/02/15 0301 08/02/15 0618 08/02/15 0932 08/02/15 1235  BP: 121/72 103/65 123/66 120/62  Pulse: 66 72 74 75  Temp: 98.4 F (36.9 C) 98 F (36.7 C) 98.3 F (36.8 C) 98 F (36.7 C)  TempSrc: Oral Oral Oral Oral  Resp: Height:      Weight:  73.936 kg (163 lb)    SpO2: 100% 94% 100% 100%    Wt Readings from Last 3 Encounters:  08/02/15 73.936 kg (163 lb)     Intake/Output Summary (Last 24 hours) at 08/02/15 1537 Last data filed at 08/02/15 0700  Gross per 24 hour  Intake 547.23 ml  Output      0 ml  Net 547.23 ml     Physical Exam  Gen: not in distress HEENT:  moist mucosa, supple neck Chest: clear b/l, no added sounds CVS: N S1&S2, no murmurs, rubs or gallop GI: soft, NT, ND, BS+ Musculoskeletal: warm, no edema     Data Review:    CBC  Recent Labs Lab 07/31/15 2201 08/01/15 0735 08/02/15 0232  WBC 5.5 6.0 5.7  HGB 14.4 13.2 12.3  HCT 42.0 38.8 36.2  PLT 249 217 222  MCV 94.8 94.9 92.3  MCH 32.5 32.3 31.4  MCHC 34.3 34.0 34.0  RDW 13.9 13.9 13.8  LYMPHSABS  --  3.1  --   MONOABS  --  0.4  --   EOSABS  --  0.1  --   BASOSABS  --  0.0  --     Chemistries    Recent Labs Lab 07/31/15 2201 08/01/15 0735 08/02/15 0232  NA 134* 135  --   K 3.5 4.0  --   CL 98* 103  --   CO2 22 23  --   GLUCOSE 110* 90  --   BUN 7 6  --   CREATININE 0.71 0.72  --   CALCIUM 9.6 8.8*  --   AST 176* 151* 94*  ALT 138* 109* 91*  ALKPHOS 104 86 81  BILITOT 1.1 1.3* 1.1   ------------------------------------------------------------------------------------------------------------------ No results for input(s): CHOL, HDL, LDLCALC, TRIG, CHOLHDL, LDLDIRECT in the last 72 hours.  No results found for: HGBA1C ------------------------------------------------------------------------------------------------------------------ No results for input(s): TSH, T4TOTAL, T3FREE, THYROIDAB in the last 72 hours.  Invalid input(s): FREET3 ------------------------------------------------------------------------------------------------------------------ No results for input(s): VITAMINB12, FOLATE, FERRITIN, TIBC, IRON, RETICCTPCT in the last 72 hours.  Coagulation profile No results for input(s): INR, PROTIME in the last 168 hours.  No results for input(s): DDIMER in the last 72 hours.  Cardiac Enzymes  Recent Labs Lab 08/01/15 0735 08/01/15 1144 08/01/15 1630  TROPONINI 0.04* 0.03 0.05*   ------------------------------------------------------------------------------------------------------------------ No results found for: BNP  Inpatient Medications  Scheduled Meds: . aspirin EC  325 mg Oral Daily  . metoprolol succinate  25 mg Oral Daily  . pantoprazole (PROTONIX) IV  40 mg Intravenous Q24H   Continuous Infusions: . heparin 1,150 Units/hr (08/02/15 0948)   PRN Meds:.ondansetron **OR** ondansetron (ZOFRAN) IV  Micro Results No results  found for this or any previous visit (from the past 240 hour(s)).  Radiology Reports US Abdomen Complete  08/01/2015  CLINICAL DATA:  Abdominal pain and elevated liver function tests. EXAM: ABDOMEN ULTRASOUND COMPLETE  COMPARISON:  CT abdomen and pelvis Aug 01, 2015 at 0045 hours FINDINGS: Gallbladder: No gallstones or wall thickening visualized. No sonographic Murphy sign noted by sonographer. Common bile duct: Diameter: 4 mm Liver: Diffusely echogenic without intrahepatic biliary dilatation. Hepatopetal portal vein. IVC: No abnormality visualized. Pancreas: Visualized portion unremarkable. Spleen: Size and appearance within normal limits. Right Kidney: Length: 10.6 cm. Echogenicity within normal limits. No mass or hydronephrosis visualized. Left Kidney: Length: 10.3 cm. Echogenicity within normal limits. No mass or hydronephrosis visualized. Abdominal aorta: No aneurysm visualized. Other findings: None. IMPRESSION: Hepatic steatosis.  Otherwise negative abdominal ultrasound. Electronically Signed   By: Awilda Metro M.D.   On: 08/01/2015 05:20   Ct Abdomen Pelvis W Contrast  08/01/2015  CLINICAL DATA:  Right-sided flank pain and abdominal pain since Friday. Vomiting for 3 days. Constipation. EXAM: CT ABDOMEN AND PELVIS WITH CONTRAST TECHNIQUE: Multidetector CT imaging of the abdomen and pelvis was performed using the standard protocol following bolus administration of intravenous contrast. CONTRAST:  ISOVUE-300 IOPAMIDOL (ISOVUE-300) INJECTION 61% COMPARISON:  None. FINDINGS: The lung bases are clear. Expansion of the left cardiac apex with thinning in calcification in the wall suggesting left ventricular aneurysm. Focal filling defect consistent with thrombus. Moderate diffuse fatty infiltration of the liver. The gallbladder, pancreas, spleen, adrenal glands, kidneys, abdominal aorta, inferior vena cava, and retroperitoneal lymph nodes are unremarkable. Stomach, small bowel, and colon are not abnormally distended. No free air or free fluid in the abdomen. Abdominal wall musculature appears intact. Pelvis: The appendix is normal. Uterus and ovaries are not enlarged. No free or loculated pelvic fluid collections. No  pelvic mass or lymphadenopathy. No destructive bone lesions. IMPRESSION: Left ventricular aneurysm containing thrombus. Diffuse fatty infiltration of the liver. Normal appendix. Electronically Signed   By: Burman Nieves M.D.   On: 08/01/2015 01:03   Dg Chest Port 1 View  08/01/2015  CLINICAL DATA:  Abdominal pain and vomiting. EXAM: PORTABLE CHEST 1 VIEW COMPARISON:  None. FINDINGS: The heart size and mediastinal contours are within normal limits. Both lungs are clear. The visualized skeletal structures are unremarkable. IMPRESSION: No active disease. Electronically Signed   By: Burman Nieves M.D.   On: 08/01/2015 05:55    Time Spent in minutes  25   Eddie North M.D on 08/02/2015 at 3:37 PM  Between 7am to 7pm - Pager - [phone]  After 7pm go to [email or web address] - password [facility]  [facility] -  Office  [phone]

## 2015-08-02 NOTE — Progress Notes (Signed)
ANTICOAGULATION CONSULT NOTE Pharmacy Consult for Heparin Indication: LV thrombus  No Known Allergies  Patient Measurements: Height: 5\' 4"  (162.6 cm) Weight: 163 lb (73.936 kg) (a scale) IBW/kg (Calculated) : 54.7 Heparin Dosing Weight: 70 kg  Vital Signs: Temp: 98.3 F (36.8 C) (05/02 0932) Temp Source: Oral (05/02 0932) BP: 123/66 mmHg (05/02 0932) Pulse Rate: 74 (05/02 0932)  Labs:  Recent Labs  07/31/15 2201 08/01/15 0735 08/01/15 1144 08/01/15 1226 08/01/15 1630 08/02/15 0232  HGB 14.4 13.2  --   --   --  12.3  HCT 42.0 38.8  --   --   --  36.2  PLT 249 217  --   --   --  222  HEPARINUNFRC  --   --   --  0.35  --  0.72*  CREATININE 0.71 0.72  --   --   --   --   TROPONINI  --  0.04* 0.03  --  0.05*  --     Estimated Creatinine Clearance: 88.4 mL/min (by C-G formula based on Cr of 0.72).   Medical History: History reviewed. No pertinent past medical history.  Medications:  Naproxen   Assessment: 45 y.o. female with abdominal pain, foound to have LV aneurysm/throbus, for heparin  Heparin level = 0.72 this AM  Goal of Therapy:  Heparin level 0.3-0.7 units/ml Monitor platelets by anticoagulation protocol: Yes   Plan:  Decrease heparin to 1150 units / hr Follow up AM labs  Thank you Okey Regal, PharmD [phone] 08/02/2015,9:46 AM

## 2015-08-02 NOTE — Care Management Note (Signed)
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 She is agreeable to go to the [facility] and [facility] for ongoing medical care. CM informed patient of the importance of keeping her apt's and the need for a yearly physical and Gyn exam. CM also informed patient of the eligibility program at the [facility] for an orange card for medication. CM will continue to follow for DCPAlexis Goodell [phone]   Expected Discharge Date:    possibly 08/04/2015              Expected Discharge Plan:  Home/Self Care  Discharge planning Services  CM Consult    Choice offered to:  NA  Status of Service:  In process, will continue to follow  Reola Mosher [phone] 08/02/2015, 2:11 PM

## 2015-08-02 NOTE — Progress Notes (Signed)
  Echocardiogram 2D Echocardiogram has been performed.  Casey [name] 08/02/2015, 11:08 AM

## 2015-08-02 NOTE — Progress Notes (Signed)
Patient Name: Casey [name] Date of Encounter: 08/02/2015  Principal Problem:   Epigastric pain Active Problems:   Old anterior myocardial infarction   Tobacco abuse   Elevated LFTs   LV (left ventricular) mural thrombus [facility])   Primary Cardiologist: Dr Jens Som  Patient Profile: 45 yo female w/ [name] known CRFs was admitted 05/01 w/ abd pain, cards seeing for abnl abd CT > calcified LV aneurysm with what appears to be a laminated thrombus. Echo ordered, ez neg so far.   SUBJECTIVE: [name] chest pain, [name] SOB, wants to know when she can go home.  OBJECTIVE Filed Vitals:   08/01/15 1157 08/01/15 2016 08/02/15 0301 08/02/15 0618  BP: 124/66 121/71 121/72 103/65  Pulse: 83 82 66 72  Temp: 98.2 F (36.8 C) 98.3 F (36.8 C) 98.4 F (36.9 C) 98 F (36.7 C)  TempSrc: Oral Oral Oral Oral  Resp: Height:  (1.626 m)     Weight: 163 lb 3.2 oz (74.027 kg)   163 lb (73.936 kg)  SpO2: 98% 100% 100% 94%    Intake/Output Summary (Last 24 hours) at 08/02/15 0929 Last data filed at 08/02/15 0700  Gross per 24 hour  Intake 547.23 ml  Output      0 ml  Net 547.23 ml   Filed Weights   07/31/15 2149 08/01/15 1157 08/02/15 0618  Weight: 163 lb (73.936 kg) 163 lb 3.2 oz (74.027 kg) 163 lb (73.936 kg)    PHYSICAL EXAM General: Well developed, well nourished, female in [name] acute distress. Head: Normocephalic, atraumatic.  Neck: Supple without bruits, JVD not elevated Lungs:  Resp regular and unlabored, CTA. Heart: RRR, S1, S2, [name] S3, S4, or murmur; [name] rub. Abdomen: Soft, non-tender, non-distended, BS + x 4.  Extremities: [name] clubbing, cyanosis, edema.  Neuro: Alert and oriented X 3. Moves all extremities spontaneously. Psych: Normal affect.  LABS: CBC: Recent Labs  08/01/15 0735 08/02/15 0232  WBC 6.0 5.7  NEUTROABS 2.4  --   HGB 13.2 12.3  HCT 38.8 36.2  MCV 94.9 92.3  PLT 217 222   Basic Metabolic Panel: Recent Labs  07/31/15 2201 08/01/15 0735    NA 134* 135  K 3.5 4.0  CL 98* 103  CO2 22 23  GLUCOSE 110* 90  BUN 7 6  CREATININE 0.71 0.72  CALCIUM 9.6 8.8*   Liver Function Tests: Recent Labs  08/01/15 0735 08/02/15 0232  AST 151* 94*  ALT 109* 91*  ALKPHOS 86 81  BILITOT 1.3* 1.1  PROT 6.4* 6.1*  ALBUMIN 3.0* 2.9*   Cardiac Enzymes: Recent Labs  08/01/15 0735 08/01/15 1144 08/01/15 1630  TROPONINI 0.04* 0.03 0.05*    Recent Labs  08/01/15 0216  TROPIPOC 0.04   TELE:   SR, [name] sig ectopy     Radiology/Studies: US Abdomen Complete 08/01/2015  CLINICAL DATA:  Abdominal pain and elevated liver function tests. EXAM: ABDOMEN ULTRASOUND COMPLETE COMPARISON:  CT abdomen and pelvis Aug 01, 2015 at 0045 hours FINDINGS: Gallbladder: [name] gallstones or wall thickening visualized. [name] sonographic Murphy sign noted by sonographer. Common bile duct: Diameter: 4 mm Liver: Diffusely echogenic without intrahepatic biliary dilatation. Hepatopetal portal vein. IVC: [name] abnormality visualized. Pancreas: Visualized portion unremarkable. Spleen: Size and appearance within normal limits. Right Kidney: Length: 10.6 cm. Echogenicity within normal limits. [name] mass or hydronephrosis visualized. Left Kidney: Length: 10.3 cm. Echogenicity within normal limits. [name] mass or hydronephrosis visualized. Abdominal aorta: [name] aneurysm visualized.  Other findings: None. IMPRESSION: Hepatic steatosis.  Otherwise negative abdominal ultrasound. Electronically Signed   By: Awilda Metro M.D.   On: 08/01/2015 05:20   Ct Abdomen Pelvis W Contrast 08/01/2015  CLINICAL DATA:  Right-sided flank pain and abdominal pain since Friday. Vomiting for 3 days. Constipation. EXAM: CT ABDOMEN AND PELVIS WITH CONTRAST TECHNIQUE: Multidetector CT imaging of the abdomen and pelvis was performed using the standard protocol following bolus administration of intravenous contrast. CONTRAST:  ISOVUE-300 IOPAMIDOL (ISOVUE-300) INJECTION 61% COMPARISON:  None. FINDINGS: The lung bases  are clear. Expansion of the left cardiac apex with thinning in calcification in the wall suggesting left ventricular aneurysm. Focal filling defect consistent with thrombus. Moderate diffuse fatty infiltration of the liver. The gallbladder, pancreas, spleen, adrenal glands, kidneys, abdominal aorta, inferior vena cava, and retroperitoneal lymph nodes are unremarkable. Stomach, small bowel, and colon are not abnormally distended. [name] free air or free fluid in the abdomen. Abdominal wall musculature appears intact. Pelvis: The appendix is normal. Uterus and ovaries are not enlarged. [name] free or loculated pelvic fluid collections. [name] pelvic mass or lymphadenopathy. [name] destructive bone lesions. IMPRESSION: Left ventricular aneurysm containing thrombus. Diffuse fatty infiltration of the liver. Normal appendix. Electronically Signed   By: Burman Nieves M.D.   On: 08/01/2015 01:03   Dg Chest Port 1 View 08/01/2015  CLINICAL DATA:  Abdominal pain and vomiting. EXAM: PORTABLE CHEST 1 VIEW COMPARISON:  None. FINDINGS: The heart size and mediastinal contours are within normal limits. Both lungs are clear. The visualized skeletal structures are unremarkable. IMPRESSION: [name] active disease. Electronically Signed   By: Burman Nieves M.D.   On: 08/01/2015 05:55     Current Medications:  . aspirin EC  325 mg Oral Daily  . metoprolol succinate  25 mg Oral Daily  . pantoprazole (PROTONIX) IV  40 mg Intravenous Q24H   . heparin 1,200 Units/hr (08/01/15 1326)    ASSESSMENT AND PLAN:  1.  LV (left ventricular) mural thrombus (HCC).  - will make sure echo gets done today - further evaluation depending on the results.  Otherwise, per IM Principal Problem:   Epigastric pain Active Problems:   Old anterior myocardial infarction   Tobacco abuse   Elevated LFTs    Signed, Barrett, Bjorn Loser , PA-C 9:29 AM 08/02/2015 As above, patient seen and examined. She denies chest pain or dyspnea. Her abdominal pain is  improving. We are awaiting echocardiogram to evaluate LV function and LV apical thrombus noted on CT scan. If LV function is reduced she will require cardiac catheterization. Continue aspirin, heparin and metoprolol. Once all procedures complete she will require long-term anticoagulation for LV apical thrombus. Olga Millers

## 2015-08-03 DIAGNOSIS — I429 Cardiomyopathy, unspecified: Secondary | ICD-10-CM

## 2015-08-03 LAB — CBC
HEMATOCRIT: 37 % (ref 36.0–46.0)
HEMOGLOBIN: 12.7 g/dL (ref 12.0–15.0)
MCH: 32 pg (ref 26.0–34.0)
MCHC: 34.3 g/dL (ref 30.0–36.0)
MCV: 93.2 fL (ref 78.0–100.0)
Platelets: 216 10*3/uL (ref 150–400)
RBC: 3.97 MIL/uL (ref 3.87–5.11)
RDW: 13.8 % (ref 11.5–15.5)
WBC: 6.5 10*3/uL (ref 4.0–10.5)

## 2015-08-03 LAB — HEPATIC FUNCTION PANEL
ALBUMIN: 3 g/dL — AB (ref 3.5–5.0)
ALK PHOS: 74 U/L (ref 38–126)
ALT: 76 U/L — ABNORMAL HIGH (ref 14–54)
AST: 62 U/L — AB (ref 15–41)
Bilirubin, Direct: 0.2 mg/dL (ref 0.1–0.5)
Indirect Bilirubin: 0.8 mg/dL (ref 0.3–0.9)
TOTAL PROTEIN: 6.5 g/dL (ref 6.5–8.1)
Total Bilirubin: 1 mg/dL (ref 0.3–1.2)

## 2015-08-03 LAB — HEPARIN LEVEL (UNFRACTIONATED)
HEPARIN UNFRACTIONATED: 0.83 [IU]/mL — AB (ref 0.30–0.70)
Heparin Unfractionated: 0.39 IU/mL (ref 0.30–0.70)

## 2015-08-03 LAB — PROTIME-INR
INR: 1.02 (ref 0.00–1.49)
Prothrombin Time: 13.6 seconds (ref 11.6–15.2)

## 2015-08-03 MED ORDER — SODIUM CHLORIDE 0.9 % IV SOLN
INTRAVENOUS | Status: DC
  Administered 2015-08-03: via INTRAVENOUS

## 2015-08-03 MED ORDER — SODIUM CHLORIDE 0.9% FLUSH: 3.0000 mL | INTRAVENOUS | Status: DC | PRN

## 2015-08-03 MED ORDER — PANTOPRAZOLE SODIUM 40 MG PO TBEC
40.0000 mg | DELAYED_RELEASE_TABLET | Freq: Every day | ORAL | Status: DC
  Administered 2015-08-04: 40 mg via ORAL
  Filled 2015-08-03: qty 1

## 2015-08-03 MED ORDER — SODIUM CHLORIDE 0.9 % IV SOLN: 250.0000 mL | INTRAVENOUS | Status: DC | PRN

## 2015-08-03 MED ORDER — SODIUM CHLORIDE 0.9% FLUSH: 3.0000 mL | Freq: Two times a day (BID) | INTRAVENOUS | Status: DC

## 2015-08-03 MED ORDER — ASPIRIN 81 MG PO CHEW
81.0000 mg | CHEWABLE_TABLET | ORAL | Status: AC
  Administered 2015-08-04: 81 mg via ORAL
  Filled 2015-08-03: qty 1

## 2015-08-03 NOTE — Progress Notes (Signed)
 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 Echo ordered, ez neg so far.   SUBJECTIVE  Denies any CP or SOB. Irritated when asked about h/o CP and MI, she denies them and says she denies those things already with other provider  CURRENT MEDS . aspirin EC  325 mg Oral Daily  . metoprolol succinate  25 mg Oral Daily  . pantoprazole (PROTONIX) IV  40 mg Intravenous Q24H    OBJECTIVE  Filed Vitals:   08/02/15 0932 08/02/15 1235 08/02/15 2005 08/03/15 0459  BP: 123/66 120/62 124/79 119/79  Pulse: 74 75 75 90  Temp: 98.3 F (36.8 C) 98 F (36.7 C) 97.8 F (36.6 C) 98.1 F (36.7 C)  TempSrc: Oral Oral Oral Oral  Resp: 16 18 18 18  Height:      Weight:    162 lb 4.8 oz (73.619 kg)  SpO2: 100% 100% 100% 100%    Intake/Output Summary (Last 24 hours) at 08/03/15 1026 Last data filed at 08/03/15 1023  Gross per 24 hour  Intake    360 ml  Output    750 ml  Net   -390 ml   Filed Weights   08/01/15 1157 08/02/15 0618 08/03/15 0459  Weight: 163 lb 3.2 oz (74.027 kg) 163 lb (73.936 kg) 162 lb 4.8 oz (73.619 kg)    PHYSICAL EXAM  General: Pleasant, NAD. Neuro: Alert and oriented X 3. Moves all extremities spontaneously. Psych: Normal affect. HEENT:  Normal  Neck: Supple without bruits or JVD. Lungs:  Resp regular and unlabored, CTA. Heart: RRR no s3, s4, or murmurs. Abdomen: Soft, non-tender, non-distended, BS + x 4.  Extremities: No clubbing, cyanosis or edema. DP/PT/Radials 2+ and equal bilaterally.  Accessory Clinical  Findings  CBC  Recent Labs  08/01/15 0735 08/02/15 0232 08/03/15 0333  WBC 6.0 5.7 6.5  NEUTROABS 2.4  --   --   HGB 13.2 12.3 12.7  HCT 38.8 36.2 37.0  MCV 94.9 92.3 93.2  PLT 217 222 216   Basic Metabolic Panel  Recent Labs  07/31/15 2201 08/01/15 0735  NA 134* 135  K 3.5 4.0  CL 98* 103  CO2 22 23  GLUCOSE 110* 90  BUN 7 6  CREATININE 0.71 0.72  CALCIUM 9.6 8.8*   Liver Function Tests  Recent Labs  08/02/15 0232 08/03/15 0333  AST 94* 62*  ALT 91* 76*  ALKPHOS 81 74  BILITOT 1.1 1.0  PROT 6.1* 6.5  ALBUMIN 2.9* 3.0*    Recent Labs  07/31/15 2201 08/01/15 0735  LIPASE 19 27   Cardiac Enzymes  Recent Labs  08/01/15 0735 08/01/15 1144 08/01/15 1630  TROPONINI 0.04* 0.03 0.05*    TELE NSR with HR 60-70s    ECG  No new EKG  Echocardiogram 08/02/2015  LV EF: 35% - 40%  ------------------------------------------------------------------- Indications: 412 MI, old.  ------------------------------------------------------------------- History: PMH: No prior cardiac history.  ------------------------------------------------------------------- Study Conclusions  - Left ventricle: The cavity size was mildly dilated. Systolic  function was moderately reduced. The   estimated ejection fraction  was in the range of 35% to 40%. Mild diffuse hypokinesis with  akinesis of the mid-apical anterior wall, mid-apical anteroseptum  and apex. Features are consistent with a pseudonormal left  ventricular filling pattern, with concomitant abnormal relaxation  and increased filling pressure (grade 2 diastolic dysfunction).  There was a thrombus. There was a 2.8 cm (L) x 1.4 cm (W),  apicalthrombus. - Aortic valve: Transvalvular velocity was within the normal range.  There was no stenosis. There was no regurgitation. - Mitral valve: Transvalvular velocity was within the normal range.  There was no evidence for stenosis. There was  mild regurgitation. - Left atrium: The atrium was mildly dilated. - Right ventricle: The cavity size was normal. Wall thickness was  normal. Systolic function was mildly reduced. - Tricuspid valve: There was mild regurgitation. - Pulmonary arteries: Systolic pressure was within the normal  range. PA peak pressure: 21 mm Hg (S). - Inferior vena cava: The vessel was normal in size. The  respirophasic diameter changes were in the normal range (>= 50%),  consistent with normal central venous pressure.    Radiology/Studies  Us Abdomen Complete  08/01/2015  CLINICAL DATA:  Abdominal pain and elevated liver function tests. EXAM: ABDOMEN ULTRASOUND COMPLETE COMPARISON:  CT abdomen and pelvis Aug 01, 2015 at 0045 hours FINDINGS: Gallbladder: No gallstones or wall thickening visualized. No sonographic Murphy sign noted by sonographer. Common bile duct: Diameter: 4 mm Liver: Diffusely echogenic without intrahepatic biliary dilatation. Hepatopetal portal vein. IVC: No abnormality visualized. Pancreas: Visualized portion unremarkable. Spleen: Size and appearance within normal limits. Right Kidney: Length: 10.6 cm. Echogenicity within normal limits. No mass or hydronephrosis visualized. Left Kidney: Length: 10.3 cm. Echogenicity within normal limits. No mass or hydronephrosis visualized. Abdominal aorta: No aneurysm visualized. Other findings: None. IMPRESSION: Hepatic steatosis.  Otherwise negative abdominal ultrasound. Electronically Signed   By: Courtnay  Bloomer M.D.   On: 08/01/2015 05:20   Ct Abdomen Pelvis W Contrast  08/01/2015  CLINICAL DATA:  Right-sided flank pain and abdominal pain since Friday. Vomiting for 3 days. Constipation. EXAM: CT ABDOMEN AND PELVIS WITH CONTRAST TECHNIQUE: Multidetector CT imaging of the abdomen and pelvis was performed using the standard protocol following bolus administration of intravenous contrast. CONTRAST:  100mL ISOVUE-300 IOPAMIDOL (ISOVUE-300) INJECTION 61%  COMPARISON:  None. FINDINGS: The lung bases are clear. Expansion of the left cardiac apex with thinning in calcification in the wall suggesting left ventricular aneurysm. Focal filling defect consistent with thrombus. Moderate diffuse fatty infiltration of the liver. The gallbladder, pancreas, spleen, adrenal glands, kidneys, abdominal aorta, inferior vena cava, and retroperitoneal lymph nodes are unremarkable. Stomach, small bowel, and colon are not abnormally distended. No free air or free fluid in the abdomen. Abdominal wall musculature appears intact. Pelvis: The appendix is normal. Uterus and ovaries are not enlarged. No free or loculated pelvic fluid collections. No pelvic mass or lymphadenopathy. No destructive bone lesions. IMPRESSION: Left ventricular aneurysm containing thrombus. Diffuse fatty infiltration of the liver. Normal appendix. Electronically Signed   By: William  Stevens M.D.   On: 08/01/2015 01:03   Dg Chest Port 1 View  08/01/2015  CLINICAL DATA:  Abdominal pain and vomiting. EXAM: PORTABLE CHEST 1 VIEW COMPARISON:  None. FINDINGS: The heart size and mediastinal contours are within normal limits. Both lungs are clear. The visualized skeletal structures are unremarkable. IMPRESSION: No active disease. Electronically Signed   By: William  Stevens M.D.   On: 08/01/2015 05:55    ASSESSMENT AND PLAN    1. LV mural thrombus  - plan for cardiac cath today, risk and benefit has been discussed by Dr. Crenshaw. After cath, will consider start OAC  2. Likely CAD with EKG change consistent with prior anterior MI  - echo 08/02/2015 EF 35-40%, grade 2 DD, 2.8x1.4 cm epical thrombus, mild MR, mild TR, PA peak pressure 21mmHg  - given LV dysfunction, will plan for cardiac catheterization without LV gram today to assess underlying CAD.    3. Abdominal pain with elevated liver enzyme: improving. May consider statin later once transaminase normalize  Signed, Meng, Hao PA-C Pager: [phone] As above,  patient seen and examined. Patient denies chest pain or dyspnea.Echocardiogram shows ejection fraction 35-40% with apical thrombus. Her echocardiogram and electrocardiogram are consistent with prior anterior infarct but she has not had any symptoms in the past. Plan to proceed with cardiac catheterization today. The risks and benefits including myocardial infarction, CVA and death discussed and patient agrees to proceed. Would not perform ventriculogram given apical thrombus. Continue aspirin, heparin and beta blocker. Add statin. Add lisinopril 2.5 mg daily for reduced LV function. After all procedures complete will transition to apixaban. I stressed the importance of medication compliance. Brian Crenshaw   

## 2015-08-03 NOTE — Progress Notes (Signed)
PROGRESS NOTE                                                                                                                                                                                                             Patient Demographics:    Casey [name], is a 45 y.o. female, DOB - 01-24-1971, ZOX:[id number]  Admit date - 07/31/2015   Admitting Physician Eduard Clos, MD  Outpatient Primary MD for the patient is No primary care provider on file.  LOS - 2  Outpatient Specialists:none  Chief Complaint  Patient presents with  . Abdominal Pain  . Emesis       Brief Narrative    45 year old female with no prior medical history presents to the ED with epigastric pain associated with nausea and vomiting for the past 3 days. Patient was nauseous every time she ate with some episodes of vomiting. The pain was epigastric radiating to bilateral upper quadrants and then to the back. Denies eating outside or similar episodes in the past. No fevers, chills or diarrhea. Denied chest shortness of breath. Patient was found to have elevated AST and AST in high 100s. CT of the abdomen and pelvis in the ED showed LV thrombus with aneurysm.  Started on heparin drip as per cardiology recommendations. 2 D echo shows low EF of 35-40% with mild diffuse hypokinesis with akinesis of the mid-apical anterior wall, mid-apical anteroseptum and apex. Suggests grade 2 diastolic dysfunction. Again seen 2.8 cm (L) x 1.4 cm (W) left apicalthrombus.   Subjective:   Denies any symptoms. Patient appears irritable.   Assessment  & Plan :    Principal Problem: LV aneurysm with thrombus/  cardiomyopathy Incidental finding. Asymptomatic.. 2d echo with findings above ( low EF with hypokinesis). EKG shows prior anterior infarct. - on IV heparin and beta blocker.  Added ACE inhibitor. Holding statin given abnormal LFTs. -Plan on cardiac cath  today.    Epigastric pain with elevated LFTs Suspect acute gastroenteritis/viral hepatitis. LFTs trending down . Imaging (CT and ultrasound) does not show any gallstones or positive Murphy sign. Lipase normal. Denies being on any medications or use of OCPs. acute hepatitis panel negative.    Active Problems: Tobacco abuse Counseled on cessation        Code Status : Full code  Family Communication  : None  Disposition Plan  : Home once w/up complete  Barriers For Discharge : pending cardiac cath  Consults  : Cardiology  Procedures  :  CT abdomen pelvis Ultrasound abdomen 2-D echo   DVT Prophylaxis  : IV heparin  Lab Results  Component Value Date   PLT 216 08/03/2015    Antibiotics  :  None  Anti-infectives    None        Objective:   Filed Vitals:   08/02/15 2005 08/03/15 0459 08/03/15 1113 08/03/15 1226  BP: 124/79 119/79 119/61 127/76  Pulse: 75 90 62 74  Temp: 97.8 F (36.6 C) 98.1 F (36.7 C)  98.2 F (36.8 C)  TempSrc: Oral Oral  Oral  Resp: Height:      Weight:  73.619 kg (162 lb 4.8 oz)    SpO2: 100% 100%  100%    Wt Readings from Last 3 Encounters:  08/03/15 73.619 kg (162 lb 4.8 oz)     Intake/Output Summary (Last 24 hours) at 08/03/15 1336 Last data filed at 08/03/15 1023  Gross per 24 hour  Intake    240 ml  Output    750 ml  Net   -510 ml     Physical Exam  Gen: not in distress HEENT:  moist mucosa, supple neck Chest: clear b/l, no added sounds CVS: N S1&S2, no murmurs, rubs or gallop GI: soft, NT, ND Musculoskeletal: warm, no edema     Data Review:    CBC  Recent Labs Lab 07/31/15 2201 08/01/15 0735 08/02/15 0232 08/03/15 0333  WBC 5.5 6.0 5.7 6.5  HGB 14.4 13.2 12.3 12.7  HCT 42.0 38.8 36.2 37.0  PLT 249 217 222 216  MCV 94.8 94.9 92.3 93.2  MCH 32.5 32.3 31.4 32.0  MCHC 34.3 34.0 34.0 34.3  RDW 13.9 13.9 13.8 13.8  LYMPHSABS  --  3.1  --   --   MONOABS  --  0.4  --   --   EOSABS  --   0.1  --   --   BASOSABS  --  0.0  --   --     Chemistries   Recent Labs Lab 07/31/15 2201 08/01/15 0735 08/02/15 0232 08/03/15 0333  NA 134* 135  --   --   K 3.5 4.0  --   --   CL 98* 103  --   --   CO2 22 23  --   --   GLUCOSE 110* 90  --   --   BUN 7 6  --   --   CREATININE 0.71 0.72  --   --   CALCIUM 9.6 8.8*  --   --   AST 176* 151* 94* 62*  ALT 138* 109* 91* 76*  ALKPHOS 104 86 81 74  BILITOT 1.1 1.3* 1.1 1.0   ------------------------------------------------------------------------------------------------------------------ No results for input(s): CHOL, HDL, LDLCALC, TRIG, CHOLHDL, LDLDIRECT in the last 72 hours.  No results found for: HGBA1C ------------------------------------------------------------------------------------------------------------------ No results for input(s): TSH, T4TOTAL, T3FREE, THYROIDAB in the last 72 hours.  Invalid input(s): FREET3 ------------------------------------------------------------------------------------------------------------------ No results for input(s): VITAMINB12, FOLATE, FERRITIN, TIBC, IRON, RETICCTPCT in the last 72 hours.  Coagulation profile  Recent Labs Lab 08/03/15 1203  INR 1.02    No results for input(s): DDIMER in the last 72 hours.  Cardiac Enzymes  Recent Labs Lab 08/01/15 0735 08/01/15 1144 08/01/15 1630  TROPONINI 0.04* 0.03 0.05*   ------------------------------------------------------------------------------------------------------------------ No results found for: BNP  Inpatient Medications  Scheduled Meds: . aspirin EC  325 mg Oral Daily  . metoprolol succinate  25 mg Oral Daily  . [START ON 08/04/2015] pantoprazole  40 mg Oral Q1200   Continuous Infusions: . heparin 1,000 Units/hr (08/03/15 0455)   PRN Meds:.ondansetron **OR** ondansetron (ZOFRAN) IV  Micro Results No results found for this or any previous visit (from the past 240 hour(s)).  Radiology Reports US Abdomen  Complete  08/01/2015  CLINICAL DATA:  Abdominal pain and elevated liver function tests. EXAM: ABDOMEN ULTRASOUND COMPLETE COMPARISON:  CT abdomen and pelvis Aug 01, 2015 at 0045 hours FINDINGS: Gallbladder: No gallstones or wall thickening visualized. No sonographic Murphy sign noted by sonographer. Common bile duct: Diameter: 4 mm Liver: Diffusely echogenic without intrahepatic biliary dilatation. Hepatopetal portal vein. IVC: No abnormality visualized. Pancreas: Visualized portion unremarkable. Spleen: Size and appearance within normal limits. Right Kidney: Length: 10.6 cm. Echogenicity within normal limits. No mass or hydronephrosis visualized. Left Kidney: Length: 10.3 cm. Echogenicity within normal limits. No mass or hydronephrosis visualized. Abdominal aorta: No aneurysm visualized. Other findings: None. IMPRESSION: Hepatic steatosis.  Otherwise negative abdominal ultrasound. Electronically Signed   By: Awilda Metro M.D.   On: 08/01/2015 05:20   Ct Abdomen Pelvis W Contrast  08/01/2015  CLINICAL DATA:  Right-sided flank pain and abdominal pain since Friday. Vomiting for 3 days. Constipation. EXAM: CT ABDOMEN AND PELVIS WITH CONTRAST TECHNIQUE: Multidetector CT imaging of the abdomen and pelvis was performed using the standard protocol following bolus administration of intravenous contrast. CONTRAST:  ISOVUE-300 IOPAMIDOL (ISOVUE-300) INJECTION 61% COMPARISON:  None. FINDINGS: The lung bases are clear. Expansion of the left cardiac apex with thinning in calcification in the wall suggesting left ventricular aneurysm. Focal filling defect consistent with thrombus. Moderate diffuse fatty infiltration of the liver. The gallbladder, pancreas, spleen, adrenal glands, kidneys, abdominal aorta, inferior vena cava, and retroperitoneal lymph nodes are unremarkable. Stomach, small bowel, and colon are not abnormally distended. No free air or free fluid in the abdomen. Abdominal wall musculature appears intact.  Pelvis: The appendix is normal. Uterus and ovaries are not enlarged. No free or loculated pelvic fluid collections. No pelvic mass or lymphadenopathy. No destructive bone lesions. IMPRESSION: Left ventricular aneurysm containing thrombus. Diffuse fatty infiltration of the liver. Normal appendix. Electronically Signed   By: Burman Nieves M.D.   On: 08/01/2015 01:03   Dg Chest Port 1 View  08/01/2015  CLINICAL DATA:  Abdominal pain and vomiting. EXAM: PORTABLE CHEST 1 VIEW COMPARISON:  None. FINDINGS: The heart size and mediastinal contours are within normal limits. Both lungs are clear. The visualized skeletal structures are unremarkable. IMPRESSION: No active disease. Electronically Signed   By: Burman Nieves M.D.   On: 08/01/2015 05:55    Time Spent in minutes  25   Eddie North M.D on 08/03/2015 at 1:36 PM  Between 7am to 7pm - Pager - [phone]  After 7pm go to [email or web address] - password [place]  [facility] -  Office  [phone]

## 2015-08-03 NOTE — Progress Notes (Signed)
ANTICOAGULATION CONSULT NOTE Pharmacy Consult for Heparin Indication: LV thrombus  No Known Allergies  Patient Measurements: Height: 5\' 4"  (162.6 cm) Weight: 162 lb 4.8 oz (73.619 kg) (scale a) IBW/kg (Calculated) : 54.7 Heparin Dosing Weight: 70 kg  Vital Signs: Temp: 98.2 F (36.8 C) (05/03 2059) Temp Source: Oral (05/03 2059) BP: 108/59 mmHg (05/03 2059) Pulse Rate: 81 (05/03 2059)  Labs:  Recent Labs  07/31/15 2201 08/01/15 0735 08/01/15 1144  08/01/15 1630 08/02/15 0232 08/03/15 0333 08/03/15 0336 08/03/15 1203 08/03/15 2040  HGB 14.4 13.2  --   --   --  12.3 12.7  --   --   --   HCT 42.0 38.8  --   --   --  36.2 37.0  --   --   --   PLT 249 217  --   --   --  222 216  --   --   --   LABPROT  --   --   --   --   --   --   --   --  13.6  --   INR  --   --   --   --   --   --   --   --  1.02  --   HEPARINUNFRC  --   --   --   < >  --  0.72*  --  0.83*  --  0.39  CREATININE 0.71 0.72  --   --   --   --   --   --   --   --   TROPONINI  --  0.04* 0.03  --  0.05*  --   --   --   --   --   < > = values in this interval not displayed.  Estimated Creatinine Clearance: 88.3 mL/min (by C-G formula based on Cr of 0.72).  Assessment: 45 y.o. female with LV thrombus for heparin -   Heparin level therapeutic at 0.39 on 1000 units/hr.   Goal of Therapy:  Heparin level 0.3-0.7 units/ml Monitor platelets by anticoagulation protocol: Yes   Plan:  Continue heparin to 1000 units / hr Follow-up daily HL and CBC.  Monitor for bleeding.   Link Snuffer, PharmD, BCPS Clinical Pharmacist [phone] 08/03/2015,9:40 PM

## 2015-08-03 NOTE — Progress Notes (Signed)
ANTICOAGULATION CONSULT NOTE Pharmacy Consult for Heparin Indication: LV thrombus  No Known Allergies  Patient Measurements: Height: 5\' 4"  (162.6 cm) Weight: 163 lb (73.936 kg) (a scale) IBW/kg (Calculated) : 54.7 Heparin Dosing Weight: 70 kg  Vital Signs: Temp: 97.8 F (36.6 C) (05/02 2005) Temp Source: Oral (05/02 2005) BP: 124/79 mmHg (05/02 2005) Pulse Rate: 75 (05/02 2005)  Labs:  Recent Labs  07/31/15 2201 08/01/15 0735 08/01/15 1144 08/01/15 1226 08/01/15 1630 08/02/15 0232 08/03/15 0333 08/03/15 0336  HGB 14.4 13.2  --   --   --  12.3 12.7  --   HCT 42.0 38.8  --   --   --  36.2 37.0  --   PLT 249 217  --   --   --  222 216  --   HEPARINUNFRC  --   --   --  0.35  --  0.72*  --  0.83*  CREATININE 0.71 0.72  --   --   --   --   --   --   TROPONINI  --  0.04* 0.03  --  0.05*  --   --   --     Estimated Creatinine Clearance: 88.4 mL/min (by C-G formula based on Cr of 0.72).  Assessment: 45 y.o. female with LV thrombus for heparin  Goal of Therapy:  Heparin level 0.3-0.7 units/ml Monitor platelets by anticoagulation protocol: Yes   Plan:  Decrease heparin to 1000 units / hr Check heparin level in 8 hours.   Geannie Risen, PharmD, BCPS  08/03/2015,4:18 AM

## 2015-08-04 ENCOUNTER — Encounter (HOSPITAL_COMMUNITY): Payer: Self-pay | Admitting: Cardiology

## 2015-08-04 ENCOUNTER — Encounter (HOSPITAL_COMMUNITY)
Admission: EM | Disposition: A | Discharge status: Home / Self Care | Payer: Self-pay | Source: Home / Self Care | Attending: Internal Medicine

## 2015-08-04 DIAGNOSIS — I2510 Atherosclerotic heart disease of native coronary artery without angina pectoris: Secondary | ICD-10-CM

## 2015-08-04 DIAGNOSIS — I255 Ischemic cardiomyopathy: Secondary | ICD-10-CM | POA: Diagnosis present

## 2015-08-04 DIAGNOSIS — Z720 Tobacco use: Secondary | ICD-10-CM

## 2015-08-04 HISTORY — DX: Atherosclerotic heart disease of native coronary artery without angina pectoris: I25.10

## 2015-08-04 HISTORY — PX: CARDIAC CATHETERIZATION: SHX172

## 2015-08-04 LAB — CBC
HCT: 37.8 % (ref 36.0–46.0)
Hemoglobin: 12.7 g/dL (ref 12.0–15.0)
MCH: 32.4 pg (ref 26.0–34.0)
MCHC: 33.6 g/dL (ref 30.0–36.0)
MCV: 96.4 fL (ref 78.0–100.0)
PLATELETS: 239 10*3/uL (ref 150–400)
RBC: 3.92 MIL/uL (ref 3.87–5.11)
RDW: 14 % (ref 11.5–15.5)
WBC: 7 10*3/uL (ref 4.0–10.5)

## 2015-08-04 LAB — HEPARIN LEVEL (UNFRACTIONATED): Heparin Unfractionated: 0.49 IU/mL (ref 0.30–0.70)

## 2015-08-04 LAB — LIPID PANEL
CHOL/HDL RATIO: 3.3 ratio
Cholesterol: 175 mg/dL (ref 0–200)
HDL: 53 mg/dL (ref >40)
LDL Cholesterol: 101 mg/dL — ABNORMAL HIGH (ref 0–99)
Triglycerides: 105 mg/dL (ref <150)
VLDL: 21 mg/dL (ref 0–40)

## 2015-08-04 SURGERY — CORONARY/GRAFT ANGIOGRAPHY

## 2015-08-04 MED ORDER — FENTANYL CITRATE (PF) 100 MCG/2ML IJ SOLN
INTRAMUSCULAR | Status: DC | PRN
  Administered 2015-08-04 (×2): 25 ug via INTRAVENOUS

## 2015-08-04 MED ORDER — LISINOPRIL 5 MG PO TABS
5.0000 mg | ORAL_TABLET | Freq: Every day | ORAL | Status: DC
  Administered 2015-08-04: 5 mg via ORAL
  Filled 2015-08-04: qty 1

## 2015-08-04 MED ORDER — MIDAZOLAM HCL 2 MG/2ML IJ SOLN
INTRAMUSCULAR | Status: DC | PRN
  Administered 2015-08-04 (×2): 1 mg via INTRAVENOUS

## 2015-08-04 MED ORDER — APIXABAN 5 MG PO TABS: 5.0000 mg | ORAL_TABLET | Freq: Two times a day (BID) | ORAL | Status: DC

## 2015-08-04 MED ORDER — SODIUM CHLORIDE 0.9 % IV SOLN: 250.0000 mL | INTRAVENOUS | Status: DC | PRN

## 2015-08-04 MED ORDER — ATORVASTATIN CALCIUM 80 MG PO TABS: 80.0000 mg | ORAL_TABLET | Freq: Every day | ORAL | Status: DC

## 2015-08-04 MED ORDER — HEPARIN (PORCINE) IN NACL 2-0.9 UNIT/ML-% IJ SOLN
INTRAMUSCULAR | Status: AC
  Filled 2015-08-04: qty 500

## 2015-08-04 MED ORDER — HEPARIN SODIUM (PORCINE) 1000 UNIT/ML IJ SOLN
INTRAMUSCULAR | Status: AC
  Filled 2015-08-04: qty 1

## 2015-08-04 MED ORDER — IOPAMIDOL (ISOVUE-370) INJECTION 76%
INTRAVENOUS | Status: DC | PRN
  Administered 2015-08-04: 80 mL via INTRA_ARTERIAL

## 2015-08-04 MED ORDER — MIDAZOLAM HCL 2 MG/2ML IJ SOLN
INTRAMUSCULAR | Status: AC
  Filled 2015-08-04: qty 2

## 2015-08-04 MED ORDER — LIDOCAINE HCL (PF) 1 % IJ SOLN
INTRAMUSCULAR | Status: AC
  Filled 2015-08-04: qty 30

## 2015-08-04 MED ORDER — FENTANYL CITRATE (PF) 100 MCG/2ML IJ SOLN
INTRAMUSCULAR | Status: AC
  Filled 2015-08-04: qty 2

## 2015-08-04 MED ORDER — SODIUM CHLORIDE 0.9% FLUSH: 3.0000 mL | Freq: Two times a day (BID) | INTRAVENOUS | Status: DC

## 2015-08-04 MED ORDER — IOPAMIDOL (ISOVUE-370) INJECTION 76%
INTRAVENOUS | Status: AC
  Filled 2015-08-04: qty 100

## 2015-08-04 MED ORDER — HEPARIN (PORCINE) IN NACL 2-0.9 UNIT/ML-% IJ SOLN
INTRAMUSCULAR | Status: DC | PRN
  Administered 2015-08-04: 1000 mL

## 2015-08-04 MED ORDER — VERAPAMIL HCL 2.5 MG/ML IV SOLN
INTRAVENOUS | Status: DC | PRN
  Administered 2015-08-04: 10 mL via INTRA_ARTERIAL

## 2015-08-04 MED ORDER — LIDOCAINE HCL (PF) 1 % IJ SOLN
INTRAMUSCULAR | Status: DC | PRN
  Administered 2015-08-04: 3 mL

## 2015-08-04 MED ORDER — SODIUM CHLORIDE 0.9% FLUSH: 3.0000 mL | INTRAVENOUS | Status: DC | PRN

## 2015-08-04 MED ORDER — SODIUM CHLORIDE 0.9 % WEIGHT BASED INFUSION
1.0000 mL/kg/h | INTRAVENOUS | Status: AC
  Administered 2015-08-04: 1 mL/kg/h via INTRAVENOUS

## 2015-08-04 MED ORDER — METOPROLOL SUCCINATE ER 25 MG PO TB24: 25.0000 mg | ORAL_TABLET | Freq: Every day | ORAL | Status: DC

## 2015-08-04 MED ORDER — HEPARIN SODIUM (PORCINE) 1000 UNIT/ML IJ SOLN
INTRAMUSCULAR | Status: DC | PRN
  Administered 2015-08-04: 4000 [IU] via INTRAVENOUS

## 2015-08-04 MED ORDER — LISINOPRIL 5 MG PO TABS: 5.0000 mg | ORAL_TABLET | Freq: Every day | ORAL | Status: DC

## 2015-08-04 MED ORDER — HEPARIN (PORCINE) IN NACL 100-0.45 UNIT/ML-% IJ SOLN: 1050.0000 [IU]/h | INTRAMUSCULAR | Status: DC

## 2015-08-04 MED ORDER — VERAPAMIL HCL 2.5 MG/ML IV SOLN
INTRAVENOUS | Status: AC
  Filled 2015-08-04: qty 2

## 2015-08-04 MED FILL — ELIQUIS 5 MG TABLET: 5 | 30 days supply | Qty: 60 | Fill #0

## 2015-08-04 MED FILL — LISINOPRIL 5 MG TABLET: 5 | 30 days supply | Qty: 30 | Fill #0

## 2015-08-04 MED FILL — ATORVASTATIN 80 MG TABLET: 80 | 30 days supply | Qty: 30 | Fill #0

## 2015-08-04 MED FILL — METOPROLOL SUCC ER 25 MG TA: 25 | 30 days supply | Qty: 30 | Fill #0

## 2015-08-04 SURGICAL SUPPLY — 11 items
CATH INFINITI 5 FR JL3.5 (CATHETERS) ×3 IMPLANT
CATH INFINITI JR4 5F (CATHETERS) ×3 IMPLANT
CATH LAUNCHER 5F RADL (CATHETERS) ×1 IMPLANT
CATHETER LAUNCHER 5F RADL (CATHETERS) ×3
DEVICE RAD COMP TR BAND LRG (VASCULAR PRODUCTS) ×3 IMPLANT
GLIDESHEATH SLEND SS 6F .021 (SHEATH) ×3 IMPLANT
KIT HEART LEFT (KITS) ×3 IMPLANT
PACK CARDIAC CATHETERIZATION (CUSTOM PROCEDURE TRAY) ×3 IMPLANT
TRANSDUCER W/STOPCOCK (MISCELLANEOUS) ×3 IMPLANT
TUBING CIL FLEX 10 FLL-RA (TUBING) ×3 IMPLANT
WIRE SAFE-T 1.5MM-J .035X260CM (WIRE) ×3 IMPLANT

## 2015-08-04 NOTE — Progress Notes (Signed)
Patient Name: Casey [name] Date of Encounter: 08/04/2015     Principal Problem:   Epigastric pain Active Problems:   Old anterior myocardial infarction   Tobacco abuse   Elevated LFTs   LV (left ventricular) mural thrombus (HCC)   Cardiomyopathy 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   SUBJECTIVE  Denies any CP or SOB.  CURRENT MEDS . aspirin EC  325 mg Oral Daily  . lisinopril  5 mg Oral Daily  . metoprolol succinate  25 mg Oral Daily  . pantoprazole  40 mg Oral Q1200    OBJECTIVE  Filed Vitals:   08/04/15 0753 08/04/15 0758 08/04/15 0803 08/04/15 0844  BP: 107/57 118/71 131/80 123/63  Pulse: 73 60 0 63  Temp:    97.9 F (36.6 C)  TempSrc:      Resp: 12 12 38   Height:      Weight:      SpO2: 96% 100% 0% 100%    Intake/Output Summary (Last 24 hours) at 08/04/15 0906 Last data filed at 08/04/15 0414  Gross per 24 hour  Intake      0 ml  Output    650 ml  Net   -650 ml   Filed Weights   08/02/15 0618 08/03/15 0459 08/04/15 0414  Weight: 163 lb (73.936 kg) 162 lb 4.8 oz (73.619 kg) 162 lb 8 oz (73.71 kg)    PHYSICAL EXAM  General: Pleasant, NAD. Neuro: Alert and oriented X 3. Moves all extremities spontaneously. HEENT:  Normal  Neck: Supple Lungs:  CTA. Heart: RRR Abdomen: Soft, non-tender, non-distended Extremities: No edema. Right radial compression band in place  Accessory Clinical Findings  CBC  Recent Labs  08/03/15 0333 08/04/15 0403  WBC 6.5 7.0  HGB 12.7 12.7  HCT 37.0 37.8  MCV 93.2 96.4  PLT 216 239   Liver Function Tests  Recent Labs  08/02/15 0232 08/03/15 0333  AST 94* 62*  ALT 91* 76*  ALKPHOS 81 74  BILITOT 1.1 1.0  PROT 6.1* 6.5  ALBUMIN 2.9* 3.0*   Cardiac Enzymes  Recent Labs  08/01/15 1144 08/01/15 1630  TROPONINI  0.03 0.05*    TELE Sinus      Echocardiogram 08/02/2015  LV EF: 35% - 40%  ------------------------------------------------------------------- Indications: 89 MI, old.  ------------------------------------------------------------------- History: PMH: No prior cardiac history.  ------------------------------------------------------------------- Study Conclusions  - Left ventricle: The cavity size was mildly dilated. Systolic  function was moderately reduced. The estimated ejection fraction  was in the range of 35% to 40%. Mild diffuse hypokinesis with  akinesis of the mid-apical anterior wall, mid-apical anteroseptum  and apex. Features are consistent with a pseudonormal left  ventricular filling pattern, with concomitant abnormal relaxation  and increased filling pressure (grade 2 diastolic dysfunction).  There was a thrombus. There was a 2.8 cm (L) x 1.4 cm (W),  apicalthrombus. - Aortic valve: Transvalvular velocity was within the normal range.  There was no stenosis. There was no regurgitation. - Mitral valve: Transvalvular velocity was within the normal range.  There was no evidence for stenosis. There was mild regurgitation. - Left atrium: The atrium was mildly dilated. - Right ventricle: The cavity size was normal. Wall thickness was  normal. Systolic function was mildly reduced. - Tricuspid valve: There was mild  regurgitation. - Pulmonary arteries: Systolic pressure was within the normal  range. PA peak pressure: 21 mm Hg (S). - Inferior vena cava: The vessel was normal in size. The  respirophasic diameter changes were in the normal range (>= 50%),  consistent with normal central venous pressure.    Radiology/Studies  US Abdomen Complete  08/01/2015  CLINICAL DATA:  Abdominal pain and elevated liver function tests. EXAM: ABDOMEN ULTRASOUND COMPLETE COMPARISON:  CT abdomen and pelvis Aug 01, 2015 at 0045 hours FINDINGS: Gallbladder: No  gallstones or wall thickening visualized. No sonographic Murphy sign noted by sonographer. Common bile duct: Diameter: 4 mm Liver: Diffusely echogenic without intrahepatic biliary dilatation. Hepatopetal portal vein. IVC: No abnormality visualized. Pancreas: Visualized portion unremarkable. Spleen: Size and appearance within normal limits. Right Kidney: Length: 10.6 cm. Echogenicity within normal limits. No mass or hydronephrosis visualized. Left Kidney: Length: 10.3 cm. Echogenicity within normal limits. No mass or hydronephrosis visualized. Abdominal aorta: No aneurysm visualized. Other findings: None. IMPRESSION: Hepatic steatosis.  Otherwise negative abdominal ultrasound. Electronically Signed   By: Awilda Metro M.D.   On: 08/01/2015 05:20   Ct Abdomen Pelvis W Contrast  08/01/2015  CLINICAL DATA:  Right-sided flank pain and abdominal pain since Friday. Vomiting for 3 days. Constipation. EXAM: CT ABDOMEN AND PELVIS WITH CONTRAST TECHNIQUE: Multidetector CT imaging of the abdomen and pelvis was performed using the standard protocol following bolus administration of intravenous contrast. CONTRAST:  ISOVUE-300 IOPAMIDOL (ISOVUE-300) INJECTION 61% COMPARISON:  None. FINDINGS: The lung bases are clear. Expansion of the left cardiac apex with thinning in calcification in the wall suggesting left ventricular aneurysm. Focal filling defect consistent with thrombus. Moderate diffuse fatty infiltration of the liver. The gallbladder, pancreas, spleen, adrenal glands, kidneys, abdominal aorta, inferior vena cava, and retroperitoneal lymph nodes are unremarkable. Stomach, small bowel, and colon are not abnormally distended. No free air or free fluid in the abdomen. Abdominal wall musculature appears intact. Pelvis: The appendix is normal. Uterus and ovaries are not enlarged. No free or loculated pelvic fluid collections. No pelvic mass or lymphadenopathy. No destructive bone lesions. IMPRESSION: Left ventricular  aneurysm containing thrombus. Diffuse fatty infiltration of the liver. Normal appendix. Electronically Signed   By: Burman Nieves M.D.   On: 08/01/2015 01:03   Dg Chest Port 1 View  08/01/2015  CLINICAL DATA:  Abdominal pain and vomiting. EXAM: PORTABLE CHEST 1 VIEW COMPARISON:  None. FINDINGS: The heart size and mediastinal contours are within normal limits. Both lungs are clear. The visualized skeletal structures are unremarkable. IMPRESSION: No active disease. Electronically Signed   By: Burman Nieves M.D.   On: 08/01/2015 05:55    ASSESSMENT AND PLAN  1. Coronary artery disease-cath results noted. Diffuse coronary disease including dissection and LAD. Plan medical therapy as anterior wall and apex appears to be infarcted. She is also not having chest pain. Discontinue aspirin given need for long-term anticoagulation. Add Lipitor 80 mg daily. Check lipids and liver in 4 weeks. 2. LV apical thrombus-the patient will need long-term anticoagulation. She does not have insurance and DOACs may be difficult. I am concerned about Coumadinas it is not clear to me she will be compliant. I will ask case management to assist. If there is assistance available for xarelto or apixaban, would treat with one of these. Otherwise will need to stay in house for coumadin/heparin until INR therapeutic. Plan repeat echocardiogram in 6 months. 3. Ischemic cardiomyopathy-continue beta blocker and ACE inhibitor.  Signed, Olga Millers MD

## 2015-08-04 NOTE — Interval H&P Note (Signed)
History and Physical Interval Note:  08/04/2015 7:22 AM  Casey Casey [name]  has presented today for surgery, with the diagnosis of cp  The various methods of treatment have been discussed with the patient and family. After consideration of risks, benefits and other options for treatment, the patient has consented to  Procedure(s): Left Heart Cath and Coronary Angiography (N/A) as a surgical intervention .  The patient's history has been reviewed, patient examined, Casey [name] change in status, stable for surgery.  I have reviewed the patient's chart and labs.  Questions were answered to the patient's satisfaction.     Casey Casey [name] [facility] 08/04/2015 7:23 AM Cath Lab Visit (complete for each Cath Lab visit)  Clinical Evaluation Leading to the Procedure:   ACS: Yes.    Non-ACS:    Anginal Classification: CCS II  Anti-ischemic medical therapy: Minimal Therapy (1 class of medications)  Non-Invasive Test Results: Casey [name] non-invasive testing performed  Prior CABG: Casey [name] previous CABG

## 2015-08-04 NOTE — H&P (View-Only) (Signed)
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 Echo ordered, ez neg so far.   SUBJECTIVE  Denies any CP or SOB. Irritated when asked about h/o CP and MI, she denies them and says she denies those things already with other provider  CURRENT MEDS . aspirin EC  325 mg Oral Daily  . metoprolol succinate  25 mg Oral Daily  . pantoprazole (PROTONIX) IV  40 mg Intravenous Q24H    OBJECTIVE  Filed Vitals:   08/02/15 0932 08/02/15 1235 08/02/15 2005 08/03/15 0459  BP: 123/66 120/62 124/79 119/79  Pulse: 74 75 75 90  Temp: 98.3 F (36.8 C) 98 F (36.7 C) 97.8 F (36.6 C) 98.1 F (36.7 C)  TempSrc: Oral Oral Oral Oral  Resp: Height:      Weight:    162 lb 4.8 oz (73.619 kg)  SpO2: 100% 100% 100% 100%    Intake/Output Summary (Last 24 hours) at 08/03/15 1026 Last data filed at 08/03/15 1023  Gross per 24 hour  Intake    360 ml  Output    750 ml  Net   -390 ml   Filed Weights   08/01/15 1157 08/02/15 0618 08/03/15 0459  Weight: 163 lb 3.2 oz (74.027 kg) 163 lb (73.936 kg) 162 lb 4.8 oz (73.619 kg)    PHYSICAL EXAM  General: Pleasant, NAD. Neuro: Alert and oriented X 3. Moves all extremities spontaneously. Psych: Normal affect. HEENT:  Normal  Neck: Supple without bruits or JVD. Lungs:  Resp regular and unlabored, CTA. Heart: RRR no s3, s4, or murmurs. Abdomen: Soft, non-tender, non-distended, BS + x 4.  Extremities: No clubbing, cyanosis or edema. DP/PT/Radials 2+ and equal bilaterally.  Accessory Clinical  Findings  CBC  Recent Labs  08/01/15 0735 08/02/15 0232 08/03/15 0333  WBC 6.0 5.7 6.5  NEUTROABS 2.4  --   --   HGB 13.2 12.3 12.7  HCT 38.8 36.2 37.0  MCV 94.9 92.3 93.2  PLT 217 222 216   Basic Metabolic Panel  Recent Labs  07/31/15 2201 08/01/15 0735  NA 134* 135  K 3.5 4.0  CL 98* 103  CO2 22 23  GLUCOSE 110* 90  BUN 7 6  CREATININE 0.71 0.72  CALCIUM 9.6 8.8*   Liver Function Tests  Recent Labs  08/02/15 0232 08/03/15 0333  AST 94* 62*  ALT 91* 76*  ALKPHOS 81 74  BILITOT 1.1 1.0  PROT 6.1* 6.5  ALBUMIN 2.9* 3.0*    Recent Labs  07/31/15 2201 08/01/15 0735  LIPASE 19 27   Cardiac Enzymes  Recent Labs  08/01/15 0735 08/01/15 1144 08/01/15 1630  TROPONINI 0.04* 0.03 0.05*    TELE NSR with HR 60-70s    ECG  No new EKG  Echocardiogram 08/02/2015  LV EF: 35% - 40%  ------------------------------------------------------------------- Indications: 54 MI, old.  ------------------------------------------------------------------- History: PMH: No prior cardiac history.  ------------------------------------------------------------------- Study Conclusions  - Left ventricle: The cavity size was mildly dilated. Systolic  function was moderately reduced. The  estimated ejection fraction  was in the range of 35% to 40%. Mild diffuse hypokinesis with  akinesis of the mid-apical anterior wall, mid-apical anteroseptum  and apex. Features are consistent with a pseudonormal left  ventricular filling pattern, with concomitant abnormal relaxation  and increased filling pressure (grade 2 diastolic dysfunction).  There was a thrombus. There was a 2.8 cm (L) x 1.4 cm (W),  apicalthrombus. - Aortic valve: Transvalvular velocity was within the normal range.  There was no stenosis. There was no regurgitation. - Mitral valve: Transvalvular velocity was within the normal range.  There was no evidence for stenosis. There was  mild regurgitation. - Left atrium: The atrium was mildly dilated. - Right ventricle: The cavity size was normal. Wall thickness was  normal. Systolic function was mildly reduced. - Tricuspid valve: There was mild regurgitation. - Pulmonary arteries: Systolic pressure was within the normal  range. PA peak pressure: 21 mm Hg (S). - Inferior vena cava: The vessel was normal in size. The  respirophasic diameter changes were in the normal range (>= 50%),  consistent with normal central venous pressure.    Radiology/Studies  US Abdomen Complete  08/01/2015  CLINICAL DATA:  Abdominal pain and elevated liver function tests. EXAM: ABDOMEN ULTRASOUND COMPLETE COMPARISON:  CT abdomen and pelvis Aug 01, 2015 at 0045 hours FINDINGS: Gallbladder: No gallstones or wall thickening visualized. No sonographic Murphy sign noted by sonographer. Common bile duct: Diameter: 4 mm Liver: Diffusely echogenic without intrahepatic biliary dilatation. Hepatopetal portal vein. IVC: No abnormality visualized. Pancreas: Visualized portion unremarkable. Spleen: Size and appearance within normal limits. Right Kidney: Length: 10.6 cm. Echogenicity within normal limits. No mass or hydronephrosis visualized. Left Kidney: Length: 10.3 cm. Echogenicity within normal limits. No mass or hydronephrosis visualized. Abdominal aorta: No aneurysm visualized. Other findings: None. IMPRESSION: Hepatic steatosis.  Otherwise negative abdominal ultrasound. Electronically Signed   By: Awilda Metro M.D.   On: 08/01/2015 05:20   Ct Abdomen Pelvis W Contrast  08/01/2015  CLINICAL DATA:  Right-sided flank pain and abdominal pain since Friday. Vomiting for 3 days. Constipation. EXAM: CT ABDOMEN AND PELVIS WITH CONTRAST TECHNIQUE: Multidetector CT imaging of the abdomen and pelvis was performed using the standard protocol following bolus administration of intravenous contrast. CONTRAST:  ISOVUE-300 IOPAMIDOL (ISOVUE-300) INJECTION 61%  COMPARISON:  None. FINDINGS: The lung bases are clear. Expansion of the left cardiac apex with thinning in calcification in the wall suggesting left ventricular aneurysm. Focal filling defect consistent with thrombus. Moderate diffuse fatty infiltration of the liver. The gallbladder, pancreas, spleen, adrenal glands, kidneys, abdominal aorta, inferior vena cava, and retroperitoneal lymph nodes are unremarkable. Stomach, small bowel, and colon are not abnormally distended. No free air or free fluid in the abdomen. Abdominal wall musculature appears intact. Pelvis: The appendix is normal. Uterus and ovaries are not enlarged. No free or loculated pelvic fluid collections. No pelvic mass or lymphadenopathy. No destructive bone lesions. IMPRESSION: Left ventricular aneurysm containing thrombus. Diffuse fatty infiltration of the liver. Normal appendix. Electronically Signed   By: Burman Nieves M.D.   On: 08/01/2015 01:03   Dg Chest Port 1 View  08/01/2015  CLINICAL DATA:  Abdominal pain and vomiting. EXAM: PORTABLE CHEST 1 VIEW COMPARISON:  None. FINDINGS: The heart size and mediastinal contours are within normal limits. Both lungs are clear. The visualized skeletal structures are unremarkable. IMPRESSION: No active disease. Electronically Signed   By: Burman Nieves M.D.   On: 08/01/2015 05:55    ASSESSMENT AND PLAN  1. LV mural thrombus  - plan for cardiac cath today, risk and benefit has been discussed by Dr. Jens Som. After cath, will consider start OAC  2. Likely CAD with EKG change consistent with prior anterior MI  - echo 08/02/2015 EF 35-40%, grade 2 DD, 2.8x1.4 cm epical thrombus, mild MR, mild TR, PA peak pressure  - given LV dysfunction, will plan for cardiac catheterization without LV gram today to assess underlying CAD.    3. Abdominal pain with elevated liver enzyme: improving. May consider statin later once transaminase normalize  Signed, Azalee Course PA-C Pager: [phone] As above,  patient seen and examined. Patient denies chest pain or dyspnea.Echocardiogram shows ejection fraction 35-40% with apical thrombus. Her echocardiogram and electrocardiogram are consistent with prior anterior infarct but she has not had any symptoms in the past. Plan to proceed with cardiac catheterization today. The risks and benefits including myocardial infarction, CVA and death discussed and patient agrees to proceed. Would not perform ventriculogram given apical thrombus. Continue aspirin, heparin and beta blocker. Add statin. Add lisinopril 2.5 mg daily for reduced LV function. After all procedures complete will transition to apixaban. I stressed the importance of medication compliance. Olga Millers

## 2015-08-04 NOTE — Progress Notes (Signed)
ANTICOAGULATION CONSULT NOTE Pharmacy Consult for Heparin Indication: LV thrombus  No Known Allergies  Patient Measurements: Height: 5\' 4"  (162.6 cm) Weight: 162 lb 8 oz (73.71 kg) (scale a) IBW/kg (Calculated) : 54.7 Heparin Dosing Weight: 70 kg  Vital Signs: Temp: 97.7 F (36.5 C) (05/04 1101) Temp Source: Oral (05/04 0414) BP: 116/58 mmHg (05/04 1101) Pulse Rate: 64 (05/04 1101)  Labs:  Recent Labs  08/01/15 1144  08/01/15 1630  08/02/15 0232 08/03/15 0333 08/03/15 0336 08/03/15 1203 08/03/15 2040 08/04/15 0403  HGB  --   --   --   < > 12.3 12.7  --   --   --  12.7  HCT  --   --   --   --  36.2 37.0  --   --   --  37.8  PLT  --   --   --   --  222 216  --   --   --  239  LABPROT  --   --   --   --   --   --   --  13.6  --   --   INR  --   --   --   --   --   --   --  1.02  --   --   HEPARINUNFRC  --   < >  --   --  0.72*  --  0.83*  --  0.39 0.49  TROPONINI 0.03  --  0.05*  --   --   --   --   --   --   --   < > = values in this interval not displayed.  Estimated Creatinine Clearance: 88.3 mL/min (by C-G formula based on Cr of 0.72).   Medical History: History reviewed. No pertinent past medical history.  Medications:  Naproxen   Assessment: 45 y.o. female with abdominal pain, foound to have LV aneurysm/throbus, to continue on heparin s/p cath for LV thrombus  Awaiting decision Coumadin vs Eliquis  Heparin to start at 4 pm  Goal of Therapy:  Heparin level 0.3-0.7 units/ml Monitor platelets by anticoagulation protocol: Yes   Plan:  Heparin at 1050 units / hr starting at 4 pm Follow up AM labs  Thank you Okey Regal, PharmD [phone] 08/04/2015,11:21 AM

## 2015-08-04 NOTE — Progress Notes (Signed)
Consent completed earlier today. Pt to receive 2nd IV site and d/c Heparin drip in AM. Labs and EKG within date. Will continue to monitor.

## 2015-08-04 NOTE — Discharge Summary (Addendum)
Physician Discharge Summary  FARM GREGORY MBW:[id number] DOB: 12/16/1970 DOA: 07/31/2015  PCP: No primary care provider on file.  Admit date: 07/31/2015 Discharge date: 08/04/2015  Time spent: 35 minutes  Recommendations for Outpatient Follow-up:  #1 Discharged home with outpatient follow-up at [facility] next week. Patient being discharged on anticoagulation. #2 Please check LFTs during her first visit. If the liver enzymes found to be elevated further, please discontinue Lipitor. #3 Follow-up with cardiology in 3-4 weeks. Patient will need repeat 2-D echo in 6 months.  Discharge Diagnoses:  Principal Problem:   Cardiomyopathy, ischemic   Active Problems:   Elevated LFTs   Old anterior myocardial infarction   Tobacco abuse   Epigastric pain   LV (left ventricular) mural thrombus (HCC)   Coronary atherosclerosis of native coronary artery   Discharge Condition: Fair  Diet recommendation: Heart healthy    Filed Weights   08/02/15 0618 08/03/15 0459 08/04/15 0414  Weight: 73.936 kg (163 lb) 73.619 kg (162 lb 4.8 oz) 73.71 kg (162 lb 8 oz)    History of present illness:  45 year old female with no prior medical history presents to the ED with epigastric pain associated with nausea and vomiting for the past 3 days. Patient was nauseous every time she ate with some episodes of vomiting. The pain was epigastric radiating to bilateral upper quadrants and then to the back. Denies eating outside or similar episodes in the past. No fevers, chills or diarrhea. Denied chest shortness of breath. Patient was found to have elevated AST and AST in high 100s. CT of the abdomen and pelvis in the ED showed LV thrombus with aneurysm. Started on heparin drip as per cardiology recommendations. 2 D echo shows low EF of 35-40% with mild diffuse hypokinesis with akinesis of the mid-apical anterior wall, mid-apical anteroseptum and apex. Suggests grade 2 diastolic  dysfunction. Again seen 2.8 cm (L) x 1.4 cm (W) left apicalthrombus.  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 Patient asymptomatic.  2d echo with findings above ( low EF with hypokinesis). EKG shows prior anterior infarct with Q waves. -Placed on IV heparin and beta blocker. Added ACE inhibitor.  Cardiac cath done today shows diffuse coronary artery disease with proximal LAD to mid LAD lesion with 90% stenosis. 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  Given possible subacute to remote MI ( Q waves on EKG and extensive akinesis on echo along with findings of dissection, cardiology recommended against PCI of the LAD on this she has refractory angina while on adequate medical therapy.) Recommend Aggressive Medical Management.  Started on Beta Blocker and ACE Inhibitor. Since LFTs have significantly improved we have started her on Lipitor 80 mg daily. (LDL of 101).  Patient was on IV heparin for LV thrombus and after discussion it was decided to place her on long-term eliquis. It appeared that patient would not be compliant with Coumadin and outpatient follow-up for frequent INR monitoring. Patient does not have insurance and case manager provided her with 30 day card along with information for medication assistance.  Discussed in detail about her underlying medical issues and importance of diet and medication adherence along with strict outpatient follow-up. Patient will follow-up with cardiology as outpatient.    Epigastric pain with elevated LFTs Suspect acute gastroenteritis/mild viral hepatitis. LFTs significantly improving. . Imaging (CT and ultrasound) does not show any gallstones or positive Murphy sign. Lipase normal. Denies being on  any medications or use of OCPs. acute hepatitis panel negative. Patient being discharged on Lipitor. She should have her LFTs checked during outpatient follow-up and  if found to be further evaluated Lipitor should be discontinued.    Active Problems: Tobacco/marijuana use Counseled on cessation   Patient is hemodynamically stable. Right radial cath site appears clean. She will be discharged with outpatient follow-up.     Code Status : Full code  Family Communication : Sister at bedside  Disposition Plan : Home     Consults : Cardiology  Procedures :  CT abdomen pelvis 2D echo Ultrasound abdomen Left heart catheterization  2d echo result: Study Conclusions  - Left ventricle: The cavity size was mildly dilated. Systolic  function was moderately reduced. The estimated ejection fraction  was in the range of 35% to 40%. Mild diffuse hypokinesis with  akinesis of the mid-apical anterior wall, mid-apical anteroseptum  and apex. Features are consistent with a pseudonormal left  ventricular filling pattern, with concomitant abnormal relaxation  and increased filling pressure (grade 2 diastolic dysfunction).  There was a thrombus. There was a 2.8 cm (L) x 1.4 cm (W),  apicalthrombus. - Aortic valve: Transvalvular velocity was within the normal range.  There was no stenosis. There was no regurgitation. - Mitral valve: Transvalvular velocity was within the normal range.  There was no evidence for stenosis. There was mild regurgitation. - Left atrium: The atrium was mildly dilated. - Right ventricle: The cavity size was normal. Wall thickness was  normal. Systolic function was mildly reduced. - Tricuspid valve: There was mild regurgitation. - Pulmonary arteries: Systolic pressure was within the normal  range. PA peak pressure: 21 mm Hg (S). - Inferior vena cava: The vessel was normal in size. The  respirophasic diameter changes were in the normal range (>= 50%),  consistent with normal central venous pressure.  Discharge Exam: Filed Vitals:   08/04/15 0943 08/04/15 1101  BP: 118/65 116/58  Pulse: 64 64  Temp: 98  F (36.7 C) 97.7 F (36.5 C)  Resp: 20 20    Gen: not in distress HEENT: moist mucosa, supple neck Chest: clear b/l, no added sounds CVS: N S1&S2, no murmurs, rubs or gallop GI: soft, NT, ND Musculoskeletal: warm, no edema, right radial cath site clean .  Discharge Instructions    Current Discharge Medication List    START taking these medications   Details  apixaban (ELIQUIS) 5 MG TABS tablet Take 1 tablet (5 mg total) by mouth 2 (two) times daily. Qty: 70 tablet, Refills: 0  Take 10 mg twice daily  for 7 days, then 5 mg twice daily  atorvastatin (LIPITOR) 80 MG tablet Take 1 tablet (80 mg total) by mouth daily. Qty: 30 tablet, Refills: 0    lisinopril (PRINIVIL,ZESTRIL) 5 MG tablet Take 1 tablet (5 mg total) by mouth daily. Qty: 30 tablet, Refills: 0    metoprolol succinate (TOPROL-XL) 25 MG 24 hr tablet Take 1 tablet (25 mg total) by mouth daily. Qty: 30 tablet, Refills: 0      STOP taking these medications     naproxen sodium (ANAPROX) 220 MG tablet        No Known Allergies Follow-up Information    Follow up with [facility] On 08/10/2015.   Why:  At 10:00 am for hospital follow up   Contact information:   [address] [place] [address] [phone]    Olga Millers, MD Schedule appointment in 4  weeks. [address] [place] [address] [phone]    The results of significant diagnostics from this hospitalization (including imaging, microbiology, ancillary and laboratory) are listed below for reference.    Significant Diagnostic Studies: US Abdomen Complete  08/01/2015  CLINICAL DATA:  Abdominal pain and elevated liver function tests. EXAM: ABDOMEN ULTRASOUND COMPLETE COMPARISON:  CT abdomen and pelvis Aug 01, 2015 at 0045 hours FINDINGS: Gallbladder: No gallstones or wall thickening visualized. No sonographic Murphy sign noted by sonographer. Common bile duct: Diameter: 4 mm Liver:  Diffusely echogenic without intrahepatic biliary dilatation. Hepatopetal portal vein. IVC: No abnormality visualized. Pancreas: Visualized portion unremarkable. Spleen: Size and appearance within normal limits. Right Kidney: Length: 10.6 cm. Echogenicity within normal limits. No mass or hydronephrosis visualized. Left Kidney: Length: 10.3 cm. Echogenicity within normal limits. No mass or hydronephrosis visualized. Abdominal aorta: No aneurysm visualized. Other findings: None. IMPRESSION: Hepatic steatosis.  Otherwise negative abdominal ultrasound. Electronically Signed   By: Awilda Metro M.D.   On: 08/01/2015 05:20   Ct Abdomen Pelvis W Contrast  08/01/2015  CLINICAL DATA:  Right-sided flank pain and abdominal pain since Friday. Vomiting for 3 days. Constipation. EXAM: CT ABDOMEN AND PELVIS WITH CONTRAST TECHNIQUE: Multidetector CT imaging of the abdomen and pelvis was performed using the standard protocol following bolus administration of intravenous contrast. CONTRAST:  ISOVUE-300 IOPAMIDOL (ISOVUE-300) INJECTION 61% COMPARISON:  None. FINDINGS: The lung bases are clear. Expansion of the left cardiac apex with thinning in calcification in the wall suggesting left ventricular aneurysm. Focal filling defect consistent with thrombus. Moderate diffuse fatty infiltration of the liver. The gallbladder, pancreas, spleen, adrenal glands, kidneys, abdominal aorta, inferior vena cava, and retroperitoneal lymph nodes are unremarkable. Stomach, small bowel, and colon are not abnormally distended. No free air or free fluid in the abdomen. Abdominal wall musculature appears intact. Pelvis: The appendix is normal. Uterus and ovaries are not enlarged. No free or loculated pelvic fluid collections. No pelvic mass or lymphadenopathy. No destructive bone lesions. IMPRESSION: Left ventricular aneurysm containing thrombus. Diffuse fatty infiltration of the liver. Normal appendix. Electronically Signed   By: Burman Nieves M.D.   On: 08/01/2015 01:03   Dg Chest Port 1 View  08/01/2015  CLINICAL DATA:  Abdominal pain and vomiting. EXAM: PORTABLE CHEST 1 VIEW COMPARISON:  None. FINDINGS: The heart size and mediastinal contours are within normal limits. Both lungs are clear. The visualized skeletal structures are unremarkable. IMPRESSION: No active disease. Electronically Signed   By: Burman Nieves M.D.   On: 08/01/2015 05:55    Microbiology: No results found for this or any previous visit (from the past 240 hour(s)).   Labs: Basic Metabolic Panel:  Recent Labs Lab 07/31/15 2201 08/01/15 0735  NA 134* 135  K 3.5 4.0  CL 98* 103  CO2 22 23  GLUCOSE 110* 90  BUN 7 6  CREATININE 0.71 0.72  CALCIUM 9.6 8.8*   Liver Function Tests:  Recent Labs Lab 07/31/15 2201 08/01/15 0735 08/02/15 0232 08/03/15 0333  AST 176* 151* 94* 62*  ALT 138* 109* 91* 76*  ALKPHOS 104 86 81 74  BILITOT 1.1 1.3* 1.1 1.0  PROT 7.5 6.4* 6.1* 6.5  ALBUMIN 3.7 3.0* 2.9* 3.0*    Recent Labs Lab 07/31/15 2201 08/01/15 0735  LIPASE 19 27   No results for input(s): AMMONIA in the last 168 hours. CBC:  Recent Labs Lab 07/31/15 2201 08/01/15 0735 08/02/15 0232 08/03/15 0333 08/04/15 0403  WBC 5.5 6.0 5.7  6.5 7.0  NEUTROABS  --  2.4  --   --   --   HGB 14.4 13.2 12.3 12.7 12.7  HCT 42.0 38.8 36.2 37.0 37.8  MCV 94.8 94.9 92.3 93.2 96.4  PLT 249 217 222 216 239   Cardiac Enzymes:  Recent Labs Lab 08/01/15 0735 08/01/15 1144 08/01/15 1630  TROPONINI 0.04* 0.03 0.05*   BNP: BNP (last 3 results) No results for input(s): BNP in the last 8760 hours.  ProBNP (last 3 results) No results for input(s): PROBNP in the last 8760 hours.  CBG: No results for input(s): GLUCAP in the last 168 hours.     Signed:  Eddie North MD.  [facility] 08/04/2015, 3:39 PM

## 2015-08-04 NOTE — Progress Notes (Signed)
Patient is to be discharged home on Eliquis; Coupon card given to patient with a free 30 day supply of medication. CM encouraged patient to follow up with their medication assistance program for ongoing support. Apt made at [facility] and [facility] for follow up medical care. Patient and daughter had lots of questions about not seeing the Artist; CM informed patient they have tried several times to see her but was unsuccessful ( ex  pt in bathroom, requesting that he come back later or pt is sleeping).  Lots of emotional support given to the patient, she stated " they are lying to me, they were supposed to use dye when they did the test and they did not." CM attempted several times to clarify with the patient that sometimes they use dye and sometimes they do not. CM will continue to follow for DCP; B Shelba Flake [phone]

## 2015-08-04 NOTE — Discharge Instructions (Signed)
Coronary Artery Disease, Female  Coronary artery disease (CAD) is a process in which the blood vessels of the heart (coronary arteries) become narrow or blocked. The narrowing or blockage can lead to decreased blood flow to the heart muscle (angina). Symptoms known as angina can develop if the blood flow is reduced to the heart for a short period of time. Prolonged reduced blood flow can cause a heart attack (myocardial infarction, MI).  CAD is a leading cause of death for women. More women die from CAD than from cancer, lung disease, and accidents combined. It is important for women to understand the risks, symptoms, and treatment options for CAD.  CAUSES  Atherosclerosis is the cause of CAD. Atherosclerosis is the buildup of fat and cholesterol (plaque) on the inside of the arteries. Over time, the plaque may narrow or block the artery, and this will lessen blood flow to the heart. Plaque can also become weak and break off within a coronary artery to form a clot and cause a sudden blockage.  RISK FACTORS  Many risk factors increase your chances of getting CAD, including:  · High cholesterol levels.  · High blood pressure (hypertension).  · Tobacco use.  · Diabetes.  · Age. Women over age 55 are at a greater risk of CAD.  · Menopause.    All postmenopausal women are at greater risk of CAD.    Women who have experienced menopause between the ages of 40-45 (early menopause) are at a higher risk of CAD.    Women who have experienced menopause before age 40 (premature menopause) are at an extremely high risk of CAD.  · Family history of CAD.  · Obesity.  · Lack of exercise.  · A diet high in saturated fats.  SYMPTOMS   Many people do not experience any symptoms during the early stages of CAD. As the condition progresses, symptoms may include:  · Chest pain.    The pain can be described as crushing or squeezing, or a tightness, pressure, fullness, or heaviness in the chest.    The pain can last more than a few minutes  or can stop and recur.  · Pain in the arms, neck, jaw, or back.  · Unexplained heartburn or indigestion.  · Shortness of breath.  · Nausea.  · Sudden cold sweats.  · Sudden light-headedness.  Many women have chest discomfort and the other symptoms. However, women often have different (atypical) symptoms, such as:  · Fatigue.  · Unexplained feelings of nervousness or anxiety.  · Unexplained weakness.  · Dizziness or fainting.  Sometimes, women may not have any symptoms of CAD.  DIAGNOSIS   Tests to diagnose CAD may include:  · ECG (electrocardiogram).  · Exercise stress test. This looks for signs of blockage when the heart is being exercised.  · Pharmacologic stress test. This test looks for signs of blockage when the heart is being stressed with a medicine.  · Blood tests.  · Coronary angiogram. This is a procedure to look at the coronary arteries to see if there is any blockage.  TREATMENT  The treatment of CAD may include the following:  · Healthy behavioral changes to reduce or control risk factors.  · Medicine.  · Coronary stenting. A stent helps to keep an artery open.  · Coronary angioplasty. This procedure widens a narrowed or blocked artery.  · Coronary artery bypass surgery. This will allow your blood to pass the blockage (bypass) to reach your heart.  HOME CARE INSTRUCTIONS  ·   progestin.  Manage other health conditions such as hypertension and diabetes as directed by your health care provider.  Follow a heart-healthy diet. A dietitian can help to educate you about healthy food options and changes.  Use healthy cooking methods such as  roasting, grilling, broiling, baking, poaching, steaming, or stir-frying. Talk to a dietitian to learn more about healthy cooking methods.  Follow an exercise program approved by your health care provider.  Maintain a healthy weight. Lose weight as approved by your health care provider.  Plan rest periods when fatigued.  Learn to manage stress.  Do not use any tobacco products, including cigarettes, chewing tobacco, or electronic cigarettes. If you need help quitting, ask your health care provider.  If you drink alcohol, and your health care provider approves, limit your alcohol intake to no more than 1 drink per day. One drink equals 12 ounces of beer, 5 ounces of wine, or 1 ounces of hard liquor.  Stop illegal drug use.  Your health care provider may ask you to monitor your blood pressure. A blood pressure reading consists of a higher number over a lower number, such as 110 over 72, which is written as 110/72. Ideally, your blood pressure should be:  Below 140/90 if you have no other medical conditions.  Below 130/80 if you have diabetes or kidney disease.  Keep all follow-up visits as directed by your health care provider. This is important. SEEK IMMEDIATE MEDICAL CARE IF:  You have pain in your chest, neck, arm, jaw, stomach, or back that lasts more than a few minutes, is recurring, or is unrelieved by taking medicine under your tongue (sublingualnitroglycerin).  You have profuse sweating without cause.  You have unexplained:  Heartburn or indigestion.  Shortness of breath or difficulty breathing.  Nausea or vomiting.  Fatigue.  Feelings of nervousness or anxiety.  Weakness.  Diarrhea.  You have sudden light-headedness or dizziness.  You faint. These symptoms may represent a serious problem that is an emergency. Do not wait to see if the symptoms will go away. Get medical help right away. Call your local emergency services (911 in the [place] Do not drive yourself  to the hospital.   This information is not intended to replace advice given to you by your health care provider. Make sure you discuss any questions you have with your health care provider.   Document Released: 06/11/2011 Document Revised: 04/09/2014 Document Reviewed: 07/21/2013 Elsevier Interactive Patient Education 2016 Elsevier Inc.  Apixaban oral tablets What is this medicine? APIXABAN (a PIX a ban) is an anticoagulant (blood thinner). It is used to lower the chance of stroke in people with a medical condition called atrial fibrillation. It is also used to treat or prevent blood clots in the lungs or in the veins. This medicine may be used for other purposes; ask your health care provider or pharmacist if you have questions. What should I tell my health care provider before I take this medicine? They need to know if you have any of these conditions: -bleeding disorders -bleeding in the brain -blood in your stools (black or tarry stools) or if you have blood in your vomit -history of stomach bleeding -kidney disease -liver disease -mechanical heart valve -an unusual or allergic reaction to apixaban, other medicines, foods, dyes, or preservatives -pregnant or trying to get pregnant -breast-feeding How should I use this medicine? Take this medicine by mouth with a glass of water. Follow the directions on the prescription label. You can take it with  or without food. If it upsets your stomach, take it with food. Take your medicine at regular intervals. Do not take it more often than directed. Do not stop taking except on your doctor's advice. Stopping this medicine may increase your risk of a blot clot. Be sure to refill your prescription before you run out of medicine. Talk to your pediatrician regarding the use of this medicine in children. Special care may be needed. Overdosage: If you think you have taken too much of this medicine contact a poison control center or emergency room at  once. NOTE: This medicine is only for you. Do not share this medicine with others. What if I miss a dose? If you miss a dose, take it as soon as you can. If it is almost time for your next dose, take only that dose. Do not take double or extra doses. What may interact with this medicine? This medicine may interact with the following: -aspirin and aspirin-like medicines -certain medicines for fungal infections like ketoconazole and itraconazole -certain medicines for seizures like carbamazepine and phenytoin -certain medicines that treat or prevent blood clots like warfarin, enoxaparin, and dalteparin -clarithromycin -NSAIDs, medicines for pain and inflammation, like ibuprofen or naproxen -rifampin -ritonavir -St. John's wort This list may not describe all possible interactions. Give your health care provider a list of all the medicines, herbs, non-prescription drugs, or dietary supplements you use. Also tell them if you smoke, drink alcohol, or use illegal drugs. Some items may interact with your medicine. What should I watch for while using this medicine? Notify your doctor or health care professional and seek emergency treatment if you develop breathing problems; changes in vision; chest pain; severe, sudden headache; pain, swelling, warmth in the leg; trouble speaking; sudden numbness or weakness of the face, arm, or leg. These can be signs that your condition has gotten worse. If you are going to have surgery, tell your doctor or health care professional that you are taking this medicine. Tell your health care professional that you use this medicine before you have a spinal or epidural procedure. Sometimes people who take this medicine have bleeding problems around the spine when they have a spinal or epidural procedure. This bleeding is very rare. If you have a spinal or epidural procedure while on this medicine, call your health care professional immediately if you have back pain, numbness  or tingling (especially in your legs and feet), muscle weakness, paralysis, or loss of bladder or bowel control. Avoid sports and activities that might cause injury while you are using this medicine. Severe falls or injuries can cause unseen bleeding. Be careful when using sharp tools or knives. Consider using an Neurosurgeon. Take special care brushing or flossing your teeth. Report any injuries, bruising, or red spots on the skin to your doctor or health care professional. What side effects may I notice from receiving this medicine? Side effects that you should report to your doctor or health care professional as soon as possible: -allergic reactions like skin rash, itching or hives, swelling of the face, lips, or tongue -signs and symptoms of bleeding such as bloody or black, tarry stools; red or dark-brown urine; spitting up blood or brown material that looks like coffee grounds; red spots on the skin; unusual bruising or bleeding from the eye, gums, or nose This list may not describe all possible side effects. Call your doctor for medical advice about side effects. You may report side effects to FDA at 1-800-FDA-1088. Where should I  keep my medicine? Keep out of the reach of children. Store at room temperature between 20 and 25 degrees C (68 and 77 degrees F). Throw away any unused medicine after the expiration date. NOTE: This sheet is a summary. It may not cover all possible information. If you have questions about this medicine, talk to your doctor, pharmacist, or health care provider.    2016, Elsevier/Gold Standard. (2012-11-21 11:59:24)

## 2015-08-08 ENCOUNTER — Ambulatory Visit (INDEPENDENT_AMBULATORY_CARE_PROVIDER_SITE_OTHER): Payer: Self-pay | Admitting: Cardiology

## 2015-08-08 ENCOUNTER — Encounter: Payer: Self-pay | Admitting: Cardiology

## 2015-08-08 VITALS — BP 106/68 | HR 103 | Ht 65.0 in | Wt 162.0 lb

## 2015-08-08 DIAGNOSIS — I213 ST elevation (STEMI) myocardial infarction of unspecified site: Secondary | ICD-10-CM

## 2015-08-08 DIAGNOSIS — I2510 Atherosclerotic heart disease of native coronary artery without angina pectoris: Secondary | ICD-10-CM

## 2015-08-08 DIAGNOSIS — I255 Ischemic cardiomyopathy: Secondary | ICD-10-CM

## 2015-08-08 DIAGNOSIS — Z720 Tobacco use: Secondary | ICD-10-CM

## 2015-08-08 DIAGNOSIS — R7989 Other specified abnormal findings of blood chemistry: Secondary | ICD-10-CM

## 2015-08-08 NOTE — Assessment & Plan Note (Signed)
Continue apiixaban. Check hemoglobin and renal function in 4 weeks. Repeat liver functions at that time as well.

## 2015-08-08 NOTE — Assessment & Plan Note (Signed)
Continue statin.No aspirin given need for anticoagulation. 

## 2015-08-08 NOTE — Progress Notes (Signed)
      HPI: 45 year old female for follow-up of coronary artery disease. Patient recently admitted with abdominal pain. Abdominal CT showed thrombus at the LV apex. Echocardiogram May 2017 showed ejection fraction 35-40% with anterior and apical wall motion abnormality. Grade 2 diastolic dysfunction. Thrombus at the apex. Mild mitral regurgitation and mild left atrial enlargement. Cardiac catheterization May 2017 showed a 90% proximal to mid LAD, 60% ramus and 50% right coronary artery. It was felt that she had likely infarcted the LAD territory and medical therapy recommended unless she has recurrent symptoms. She was placed on apixaban. Note at time of presentation her liver functions were mildly elevated. This was improving at time of discharge. Since discharge she denies dyspnea on exertion, orthopnea, PND, pedal edema, chest pain, syncope or bleeding.  Current Outpatient Prescriptions  Medication Sig Dispense Refill  . apixaban (ELIQUIS) 5 MG TABS tablet Take 1 tablet (5 mg total) by mouth 2 (two) times daily. 70 tablet 0  . atorvastatin (LIPITOR) 80 MG tablet Take 1 tablet (80 mg total) by mouth daily. 30 tablet 0  . lisinopril (PRINIVIL,ZESTRIL) 5 MG tablet Take 1 tablet (5 mg total) by mouth daily. 30 tablet 0  . metoprolol succinate (TOPROL-XL) 25 MG 24 hr tablet Take 1 tablet (25 mg total) by mouth daily. 30 tablet 0   No current facility-administered medications for this visit.     Past Medical History  Diagnosis Date  . CAD (coronary artery disease)   . Ischemic cardiomyopathy     Past Surgical History  Procedure Laterality Date  . Cesarean section    . Cardiac catheterization N/A 08/04/2015    Procedure: Coronary/Graft Angiography;  Surgeon: Peter M [place], MD;  Location: [facility] INVASIVE CV LAB;  Service: Cardiovascular;  Laterality: N/A;    Social History   Social History  . Marital Status: Single    Spouse Name: N/A  . Number of Children: N/A  . Years of Education: N/A    Occupational History  . Not on file.   Social History Main Topics  . Smoking status: Current Every Day Smoker  . Smokeless tobacco: Never Used  . Alcohol Use: 0.0 oz/week    0 Standard drinks or equivalent per week     Comment: Two drinks a week.  . Drug Use: No     Comment: No history of cocaine use  . Sexual Activity: Not on file   Other Topics Concern  . Not on file   Social History Narrative    Family History  Problem Relation Age of Onset  . Diabetes Mellitus II Neg Hx   . CAD Neg Hx     ROS: no fevers or chills, productive cough, hemoptysis, dysphasia, odynophagia, melena, hematochezia, dysuria, hematuria, rash, seizure activity, orthopnea, PND, pedal edema, claudication. Remaining systems are negative.  Physical Exam: Well-developed well-nourished in no acute distress.  Skin is warm and dry.  HEENT is normal.  Neck is supple.  Chest is clear to auscultation with normal expansion.  Cardiovascular exam is regular rate and rhythm.  Abdominal exam nontender or distended. No masses palpated. Extremities show no edema. neuro grossly intact

## 2015-08-08 NOTE — Assessment & Plan Note (Signed)
Patient counseled on discontinuing. 

## 2015-08-08 NOTE — Assessment & Plan Note (Signed)
Continue ACE inhibitor and beta blocker. Will plan to repeat echocardiogram in 3 months.

## 2015-08-08 NOTE — Assessment & Plan Note (Signed)
Repeat liver functions in 4 weeks.

## 2015-08-08 NOTE — Patient Instructions (Signed)
Medication Instructions:   NO CHANGE  Testing/Procedures:  Your physician has requested that you have an echocardiogram. Echocardiography is a painless test that uses sound waves to create images of your heart. It provides your doctor with information about the size and shape of your heart and how well your heart's chambers and valves are working. This procedure takes approximately one hour. There are no restrictions for this procedure.SCHEDULE IN 3 MONTHS PRIOR TO FOLLOW UP APPT    Follow-Up:  Your physician recommends that you schedule a follow-up appointment in: 3 MONTHS WITH DR Jens Som

## 2015-08-10 ENCOUNTER — Encounter: Payer: Self-pay | Admitting: Internal Medicine

## 2015-08-10 ENCOUNTER — Ambulatory Visit: Payer: Self-pay | Attending: Internal Medicine | Admitting: Internal Medicine

## 2015-08-10 VITALS — BP 104/68 | HR 94 | Temp 98.7°F | Resp 18 | Ht 65.0 in | Wt 163.2 lb

## 2015-08-10 DIAGNOSIS — I213 ST elevation (STEMI) myocardial infarction of unspecified site: Secondary | ICD-10-CM

## 2015-08-10 DIAGNOSIS — K760 Fatty (change of) liver, not elsewhere classified: Secondary | ICD-10-CM | POA: Insufficient documentation

## 2015-08-10 DIAGNOSIS — I255 Ischemic cardiomyopathy: Secondary | ICD-10-CM

## 2015-08-10 DIAGNOSIS — I513 Intracardiac thrombosis, not elsewhere classified: Secondary | ICD-10-CM

## 2015-08-10 DIAGNOSIS — Z7901 Long term (current) use of anticoagulants: Secondary | ICD-10-CM | POA: Insufficient documentation

## 2015-08-10 DIAGNOSIS — R7989 Other specified abnormal findings of blood chemistry: Secondary | ICD-10-CM

## 2015-08-10 DIAGNOSIS — R1013 Epigastric pain: Secondary | ICD-10-CM

## 2015-08-10 DIAGNOSIS — R945 Abnormal results of liver function studies: Secondary | ICD-10-CM

## 2015-08-10 DIAGNOSIS — I2510 Atherosclerotic heart disease of native coronary artery without angina pectoris: Secondary | ICD-10-CM

## 2015-08-10 DIAGNOSIS — I8290 Acute embolism and thrombosis of unspecified vein: Secondary | ICD-10-CM | POA: Insufficient documentation

## 2015-08-10 MED ORDER — ATORVASTATIN CALCIUM 80 MG PO TABS: 80.0000 mg | ORAL_TABLET | Freq: Every day | ORAL | Status: DC

## 2015-08-10 MED ORDER — APIXABAN 5 MG PO TABS: 5.0000 mg | ORAL_TABLET | Freq: Two times a day (BID) | ORAL | Status: DC

## 2015-08-10 MED ORDER — LISINOPRIL 5 MG PO TABS: 5.0000 mg | ORAL_TABLET | Freq: Every day | ORAL | Status: DC

## 2015-08-10 MED ORDER — METOPROLOL SUCCINATE ER 25 MG PO TB24: 25.0000 mg | ORAL_TABLET | Freq: Every day | ORAL | Status: DC

## 2015-08-10 NOTE — Progress Notes (Signed)
Patient is here for HFU for Left Ventricular Aneurysm  Patient denies pain at this time.  Patient has taken medications today and patient has eaten.

## 2015-08-10 NOTE — Patient Instructions (Addendum)
It was good to see you today.  We have reviewed your prior records including labs and tests today  Medications reviewed and updated, no changes recommended at this time. Refill on medication(s) as discussed today.  Keep follow up with Dr. Jens Som (cardiology) as planned in 11/2015  Please call here sooner if problems like shortness of breath or feet.ankle swelling

## 2015-08-10 NOTE — Assessment & Plan Note (Signed)
Cath results reviewed - no symptoms On med mgmt and following with cards for same

## 2015-08-10 NOTE — Progress Notes (Signed)
Subjective:    Patient ID: Casey [name], female    DOB: [birth year], 45 y.o.   MRN: [id number]  HPI  Patient here for hospital follow up Seen 4/30-5.5 for LV thrombus and CAD - also increase LFTs Medical mgmt ongoing and has seen cards in HFU 2 days ago - [name] changes made Pt feel generally well today  Past Medical History  Diagnosis Date  . CAD (coronary artery disease)   . Ischemic cardiomyopathy     Review of Systems  Constitutional: Positive for fatigue. Negative for unexpected weight change.  Respiratory: Negative for cough and shortness of breath.   Cardiovascular: Negative for chest pain and leg swelling.       Objective:    Physical Exam  Constitutional: She is oriented to person, place, and time. She appears well-developed and well-nourished. [name] distress.  Sister at side  Cardiovascular: Normal rate, regular rhythm and normal heart sounds.   [name] murmur heard. Pulmonary/Chest: Effort normal and breath sounds normal. [name] respiratory distress.  Musculoskeletal: She exhibits [name] edema.  Neurological: She is alert and oriented to person, place, and time. She has normal reflexes.  Psychiatric: She has a normal mood and affect. Her behavior is normal. Thought content normal.    BP 104/68 mmHg  Pulse 94  Temp(Src) 98.7 F (37.1 C) (Oral)  Resp 18  Ht 5\' 5"  (1.651 m)  Wt 163 lb 3.2 oz (74.027 kg)  BMI 27.16 kg/m2  SpO2 98%  LMP 07/31/2015 Wt Readings from Last 3 Encounters:  08/10/15 163 lb 3.2 oz (74.027 kg)  08/08/15 162 lb (73.483 kg)  08/04/15 162 lb 8 oz (73.71 kg)     Lab Results  Component Value Date   WBC 7.0 08/04/2015   HGB 12.7 08/04/2015   HCT 37.8 08/04/2015   PLT 239 08/04/2015   GLUCOSE 90 08/01/2015   CHOL 175 08/04/2015   TRIG 105 08/04/2015   HDL 53 08/04/2015   LDLCALC 101* 08/04/2015   ALT 76* 08/03/2015   AST 62* 08/03/2015   NA 135 08/01/2015   K 4.0 08/01/2015   CL 103 08/01/2015   CREATININE 0.72 08/01/2015   BUN 6  08/01/2015   CO2 23 08/01/2015   INR 1.02 08/03/2015    US Abdomen Complete  08/01/2015  CLINICAL DATA:  Abdominal pain and elevated liver function tests. EXAM: ABDOMEN ULTRASOUND COMPLETE COMPARISON:  CT abdomen and pelvis Aug 01, 2015 at 0045 hours FINDINGS: Gallbladder: [name] gallstones or wall thickening visualized. [name] sonographic Murphy sign noted by sonographer. Common bile duct: Diameter: 4 mm Liver: Diffusely echogenic without intrahepatic biliary dilatation. Hepatopetal portal vein. IVC: [name] abnormality visualized. Pancreas: Visualized portion unremarkable. Spleen: Size and appearance within normal limits. Right Kidney: Length: 10.6 cm. Echogenicity within normal limits. [name] mass or hydronephrosis visualized. Left Kidney: Length: 10.3 cm. Echogenicity within normal limits. [name] mass or hydronephrosis visualized. Abdominal aorta: [name] aneurysm visualized. Other findings: None. IMPRESSION: Hepatic steatosis.  Otherwise negative abdominal ultrasound. Electronically Signed   By: Awilda Metro M.D.   On: 08/01/2015 05:20   Ct Abdomen Pelvis W Contrast  08/01/2015  CLINICAL DATA:  Right-sided flank pain and abdominal pain since Friday. Vomiting for 3 days. Constipation. EXAM: CT ABDOMEN AND PELVIS WITH CONTRAST TECHNIQUE: Multidetector CT imaging of the abdomen and pelvis was performed using the standard protocol following bolus administration of intravenous contrast. CONTRAST:  ISOVUE-300 IOPAMIDOL (ISOVUE-300) INJECTION 61% COMPARISON:  None. FINDINGS: The lung bases are clear. Expansion of the left cardiac  apex with thinning in calcification in the wall suggesting left ventricular aneurysm. Focal filling defect consistent with thrombus. Moderate diffuse fatty infiltration of the liver. The gallbladder, pancreas, spleen, adrenal glands, kidneys, abdominal aorta, inferior vena cava, and retroperitoneal lymph nodes are unremarkable. Stomach, small bowel, and colon are not abnormally distended. [name] free air  or free fluid in the abdomen. Abdominal wall musculature appears intact. Pelvis: The appendix is normal. Uterus and ovaries are not enlarged. [name] free or loculated pelvic fluid collections. [name] pelvic mass or lymphadenopathy. [name] destructive bone lesions. IMPRESSION: Left ventricular aneurysm containing thrombus. Diffuse fatty infiltration of the liver. Normal appendix. Electronically Signed   By: Burman Nieves M.D.   On: 08/01/2015 01:03   Dg Chest Port 1 View  08/01/2015  CLINICAL DATA:  Abdominal pain and vomiting. EXAM: PORTABLE CHEST 1 VIEW COMPARISON:  None. FINDINGS: The heart size and mediastinal contours are within normal limits. Both lungs are clear. The visualized skeletal structures are unremarkable. IMPRESSION: [name] active disease. Electronically Signed   By: Burman Nieves M.D.   On: 08/01/2015 05:55       Assessment & Plan:   Problem List Items Addressed This Visit    Cardiomyopathy, ischemic    [name] symptoms exacerbation On med mgmt for same and following with cards - planning repeat echo in 3 mo (appt scheduled with cards) [name] changes recommended today Education provided to pt and sister on same      Relevant Medications   metoprolol succinate (TOPROL-XL) 25 MG 24 hr tablet   lisinopril (PRINIVIL,ZESTRIL) 5 MG tablet   atorvastatin (LIPITOR) 80 MG tablet   apixaban (ELIQUIS) 5 MG TABS tablet   Coronary atherosclerosis of native coronary artery    Cath results reviewed - [name] symptoms On med mgmt and following with cards for same      Relevant Medications   metoprolol succinate (TOPROL-XL) 25 MG 24 hr tablet   lisinopril (PRINIVIL,ZESTRIL) 5 MG tablet   atorvastatin (LIPITOR) 80 MG tablet   apixaban (ELIQUIS) 5 MG TABS tablet   Elevated LFTs    For repeat labs in 4 weeks as scheduled [name] GI symptoms       Epigastric pain    Admitting symptom for Ed visit leading to hosp; resolved prior to DC [name] recurrence [name] other tx at this time      Left ventricular thrombus  (HCC) - Primary    Now on anticoag following dx of same during 08/2015 hosp Following with cards for same Refills available for next 6 mo provided today      Relevant Medications   metoprolol succinate (TOPROL-XL) 25 MG 24 hr tablet   lisinopril (PRINIVIL,ZESTRIL) 5 MG tablet   atorvastatin (LIPITOR) 80 MG tablet   apixaban (ELIQUIS) 5 MG TABS tablet       Rene Paci, MD

## 2015-08-10 NOTE — Assessment & Plan Note (Signed)
For repeat labs in 4 weeks as scheduled No GI symptoms

## 2015-08-10 NOTE — Assessment & Plan Note (Signed)
Admitting symptom for Ed visit leading to hosp; resolved prior to DC No recurrence No other tx at this time

## 2015-08-10 NOTE — Assessment & Plan Note (Signed)
Now on anticoag following dx of same during 08/2015 hosp Following with cards for same Refills available for next 6 mo provided today

## 2015-08-10 NOTE — Assessment & Plan Note (Signed)
No symptoms exacerbation On med mgmt for same and following with cards - planning repeat echo in 3 mo (appt scheduled with cards) No changes recommended today Education provided to pt and sister on same

## 2015-10-08 ENCOUNTER — Encounter (HOSPITAL_COMMUNITY): Payer: Self-pay

## 2015-10-08 ENCOUNTER — Inpatient Hospital Stay (HOSPITAL_COMMUNITY)
Admission: EM | Admit: 2015-10-08 | Discharge: 2015-10-14 | DRG: 281 | Disposition: A | Discharge status: Home / Self Care | Payer: Self-pay | Attending: Internal Medicine | Admitting: Internal Medicine

## 2015-10-08 ENCOUNTER — Emergency Department (HOSPITAL_COMMUNITY): Payer: Self-pay

## 2015-10-08 DIAGNOSIS — E669 Obesity, unspecified: Secondary | ICD-10-CM | POA: Diagnosis present

## 2015-10-08 DIAGNOSIS — I214 Non-ST elevation (NSTEMI) myocardial infarction: Secondary | ICD-10-CM

## 2015-10-08 DIAGNOSIS — F17210 Nicotine dependence, cigarettes, uncomplicated: Secondary | ICD-10-CM | POA: Diagnosis present

## 2015-10-08 DIAGNOSIS — Z79899 Other long term (current) drug therapy: Secondary | ICD-10-CM

## 2015-10-08 DIAGNOSIS — R079 Chest pain, unspecified: Secondary | ICD-10-CM

## 2015-10-08 DIAGNOSIS — E876 Hypokalemia: Secondary | ICD-10-CM | POA: Diagnosis present

## 2015-10-08 DIAGNOSIS — M940 Chondrocostal junction syndrome [Tietze]: Secondary | ICD-10-CM | POA: Diagnosis present

## 2015-10-08 DIAGNOSIS — I2510 Atherosclerotic heart disease of native coronary artery without angina pectoris: Secondary | ICD-10-CM

## 2015-10-08 DIAGNOSIS — I252 Old myocardial infarction: Secondary | ICD-10-CM

## 2015-10-08 DIAGNOSIS — I509 Heart failure, unspecified: Secondary | ICD-10-CM

## 2015-10-08 DIAGNOSIS — I5022 Chronic systolic (congestive) heart failure: Secondary | ICD-10-CM | POA: Diagnosis present

## 2015-10-08 DIAGNOSIS — I081 Rheumatic disorders of both mitral and tricuspid valves: Secondary | ICD-10-CM | POA: Diagnosis present

## 2015-10-08 DIAGNOSIS — Z9114 Patient's other noncompliance with medication regimen: Secondary | ICD-10-CM

## 2015-10-08 DIAGNOSIS — R1013 Epigastric pain: Secondary | ICD-10-CM

## 2015-10-08 DIAGNOSIS — I255 Ischemic cardiomyopathy: Secondary | ICD-10-CM

## 2015-10-08 DIAGNOSIS — N390 Urinary tract infection, site not specified: Secondary | ICD-10-CM | POA: Diagnosis present

## 2015-10-08 DIAGNOSIS — Z7901 Long term (current) use of anticoagulants: Secondary | ICD-10-CM

## 2015-10-08 DIAGNOSIS — F17200 Nicotine dependence, unspecified, uncomplicated: Secondary | ICD-10-CM | POA: Diagnosis present

## 2015-10-08 DIAGNOSIS — F329 Major depressive disorder, single episode, unspecified: Secondary | ICD-10-CM | POA: Diagnosis present

## 2015-10-08 DIAGNOSIS — Z720 Tobacco use: Secondary | ICD-10-CM | POA: Diagnosis present

## 2015-10-08 DIAGNOSIS — R109 Unspecified abdominal pain: Secondary | ICD-10-CM

## 2015-10-08 DIAGNOSIS — Z6827 Body mass index (BMI) 27.0-27.9, adult: Secondary | ICD-10-CM

## 2015-10-08 DIAGNOSIS — I513 Intracardiac thrombosis, not elsewhere classified: Secondary | ICD-10-CM | POA: Diagnosis present

## 2015-10-08 HISTORY — DX: Long term (current) use of anticoagulants: Z79.01

## 2015-10-08 HISTORY — DX: Atherosclerotic heart disease of native coronary artery without angina pectoris: I25.10

## 2015-10-08 HISTORY — DX: Urinary tract infection, site not specified: N39.0

## 2015-10-08 LAB — I-STAT TROPONIN, ED: Troponin i, poc: 0.61 ng/mL (ref 0.00–0.08)

## 2015-10-08 LAB — URINALYSIS, ROUTINE W REFLEX MICROSCOPIC
GLUCOSE, UA: NEGATIVE mg/dL
KETONES UR: 15 mg/dL — AB
Nitrite: POSITIVE — AB
PH: 5.5 (ref 5.0–8.0)
PROTEIN: 30 mg/dL — AB
Specific Gravity, Urine: 1.021 (ref 1.005–1.030)

## 2015-10-08 LAB — LIPASE, BLOOD: LIPASE: 18 U/L (ref 11–51)

## 2015-10-08 LAB — I-STAT VENOUS BLOOD GAS, ED
Acid-Base Excess: 1 mmol/L (ref 0.0–2.0)
BICARBONATE: 24.5 meq/L — AB (ref 20.0–24.0)
O2 Saturation: 56 %
PH VEN: 7.445 — AB (ref 7.250–7.300)
PO2 VEN: 28 mmHg — AB (ref 31.0–45.0)
TCO2: 26 mmol/L (ref 0–100)
pCO2, Ven: 35.7 mmHg — ABNORMAL LOW (ref 45.0–50.0)

## 2015-10-08 LAB — I-STAT BETA HCG BLOOD, ED (MC, WL, AP ONLY): I-stat hCG, quantitative: <5 m[IU]/mL (ref <5)

## 2015-10-08 LAB — CBC
HCT: 43.2 % (ref 36.0–46.0)
HEMOGLOBIN: 14.8 g/dL (ref 12.0–15.0)
MCH: 31.9 pg (ref 26.0–34.0)
MCHC: 34.3 g/dL (ref 30.0–36.0)
MCV: 93.1 fL (ref 78.0–100.0)
PLATELETS: 289 10*3/uL (ref 150–400)
RBC: 4.64 MIL/uL (ref 3.87–5.11)
RDW: 16.4 % — AB (ref 11.5–15.5)
WBC: 6.4 10*3/uL (ref 4.0–10.5)

## 2015-10-08 LAB — TROPONIN I: Troponin I: 1.17 ng/mL (ref <0.03)

## 2015-10-08 LAB — URINE MICROSCOPIC-ADD ON: RBC / HPF: NONE SEEN RBC/hpf (ref 0–5)

## 2015-10-08 LAB — BRAIN NATRIURETIC PEPTIDE: B NATRIURETIC PEPTIDE 5: 106.4 pg/mL — AB (ref 0.0–100.0)

## 2015-10-08 LAB — CBG MONITORING, ED: Glucose-Capillary: 130 mg/dL — ABNORMAL HIGH (ref 65–99)

## 2015-10-08 LAB — LACTIC ACID, PLASMA: Lactic Acid, Venous: 1.6 mmol/L (ref 0.5–1.9)

## 2015-10-08 MED ORDER — METOPROLOL SUCCINATE ER 25 MG PO TB24
25.0000 mg | ORAL_TABLET | Freq: Every day | ORAL | Status: DC
  Administered 2015-10-08 – 2015-10-11 (×4): 25 mg via ORAL
  Filled 2015-10-08 (×4): qty 1

## 2015-10-08 MED ORDER — NICOTINE 14 MG/24HR TD PT24
14.0000 mg | MEDICATED_PATCH | Freq: Every day | TRANSDERMAL | Status: DC
  Filled 2015-10-08 (×4): qty 1

## 2015-10-08 MED ORDER — ASPIRIN EC 81 MG PO TBEC
81.0000 mg | DELAYED_RELEASE_TABLET | Freq: Every day | ORAL | Status: DC
  Administered 2015-10-09 – 2015-10-10 (×2): 81 mg via ORAL
  Filled 2015-10-08 (×2): qty 1

## 2015-10-08 MED ORDER — ONDANSETRON HCL 4 MG/2ML IJ SOLN
4.0000 mg | Freq: Four times a day (QID) | INTRAMUSCULAR | Status: DC | PRN
  Administered 2015-10-09 (×2): 4 mg via INTRAVENOUS
  Filled 2015-10-08 (×2): qty 2

## 2015-10-08 MED ORDER — ACETAMINOPHEN 325 MG PO TABS
650.0000 mg | ORAL_TABLET | ORAL | Status: DC | PRN
Start: 2015-10-08 — End: 2015-10-10
  Administered 2015-10-09: 650 mg via ORAL
  Filled 2015-10-08: qty 2

## 2015-10-08 MED ORDER — POTASSIUM CHLORIDE CRYS ER 20 MEQ PO TBCR
20.0000 meq | EXTENDED_RELEASE_TABLET | Freq: Once | ORAL | Status: AC
  Administered 2015-10-08: 20 meq via ORAL
  Filled 2015-10-08: qty 1

## 2015-10-08 MED ORDER — CLOPIDOGREL BISULFATE 75 MG PO TABS
600.0000 mg | ORAL_TABLET | Freq: Once | ORAL | Status: AC
  Administered 2015-10-08: 600 mg via ORAL
  Filled 2015-10-08: qty 8

## 2015-10-08 MED ORDER — HEPARIN BOLUS VIA INFUSION
4000.0000 [IU] | Freq: Once | INTRAVENOUS | Status: AC
  Administered 2015-10-08: 4000 [IU] via INTRAVENOUS
  Filled 2015-10-08: qty 4000

## 2015-10-08 MED ORDER — DEXTROSE 5 % IV SOLN
1.0000 g | INTRAVENOUS | Status: DC
  Administered 2015-10-08: 1 g via INTRAVENOUS
  Filled 2015-10-08 (×2): qty 10

## 2015-10-08 MED ORDER — ONDANSETRON HCL 4 MG/2ML IJ SOLN
4.0000 mg | Freq: Once | INTRAMUSCULAR | Status: AC
  Administered 2015-10-08: 4 mg via INTRAVENOUS
  Filled 2015-10-08: qty 2

## 2015-10-08 MED ORDER — CEFTRIAXONE SODIUM 1 G IJ SOLR: 1.0000 g | INTRAMUSCULAR | Status: DC

## 2015-10-08 MED ORDER — SODIUM CHLORIDE 0.9 % IV BOLUS (SEPSIS)
1000.0000 mL | Freq: Once | INTRAVENOUS | Status: AC
  Administered 2015-10-08: 1000 mL via INTRAVENOUS

## 2015-10-08 MED ORDER — ASPIRIN 81 MG PO CHEW
324.0000 mg | CHEWABLE_TABLET | Freq: Once | ORAL | Status: AC
  Administered 2015-10-08: 324 mg via ORAL
  Filled 2015-10-08: qty 4

## 2015-10-08 MED ORDER — HEPARIN (PORCINE) IN NACL 100-0.45 UNIT/ML-% IJ SOLN
1100.0000 [IU]/h | INTRAMUSCULAR | Status: DC
  Administered 2015-10-08 – 2015-10-09 (×2): 1000 [IU]/h via INTRAVENOUS
  Filled 2015-10-08 (×2): qty 250

## 2015-10-08 MED ORDER — NITROGLYCERIN 0.4 MG SL SUBL: 0.4000 mg | SUBLINGUAL_TABLET | SUBLINGUAL | Status: DC | PRN

## 2015-10-08 MED ORDER — ATORVASTATIN CALCIUM 80 MG PO TABS
80.0000 mg | ORAL_TABLET | Freq: Every day | ORAL | Status: DC
  Administered 2015-10-08 – 2015-10-14 (×4): 80 mg via ORAL
  Filled 2015-10-08 (×7): qty 1

## 2015-10-08 NOTE — H&P (Addendum)
  Primary Cardiologist:Crenshaw  No PCP Per Patient  Chief Complaint: Nausea, epigastric pain  HPI: Ms. Casey [name] is a 44 year old woman with history of CAD, not revascularized, EF 35-40% / ischemic cardiomyopathy, LV thrombus.  Cardiac catheterization May 2017 showed a 90% proximal to mid LAD (possible dissection), 60% ramus and 50% right coronary artery.  At that time she presented with abdominal discomfort and nausea and given her akinetic anterior wall, it was thought that she had likely completed an anterolateral infarct and medical management was started.    She followed in clinic with Dr. Crenshaw and has now been off all medications.  She recalls being told that she only needed one month of anticoagulation and no aspirin therapy although notes she that she should at least be on anticoagulation.  At this point, she is on no medicines.  Over the past few days, she has experienced nausea/vommiting and epigastric discomfort.   These are similar symptoms that led her to present to the hospital in May.  She has never had chest pain.    In ED: ECG showing new inferior TWI, stable appearance to anterolateral QW.  ISTAT troponin elevated to 0.61, Troponin I 1.2.  RUQ checked and wnl.  Started on heparin ACS protocol and given 325 ASA.  Past Medical History  Diagnosis Date  . CAD (coronary artery disease)   . Ischemic cardiomyopathy     Past Surgical History  Procedure Laterality Date  . Cesarean section    . Cardiac catheterization N/A 08/04/2015    Procedure: Coronary/Graft Angiography;  Surgeon: Peter M Jordan, MD;  Location: [facility] INVASIVE CV LAB;  Service: Cardiovascular;  Laterality: N/A;    Family History  Problem Relation Age of Onset  . Diabetes Mellitus II Neg Hx   . CAD Neg Hx    Social History:  reports that she has been smoking Cigarettes.  She has been smoking about 0.50 packs per day. She has never used smokeless tobacco. She reports that she drinks alcohol. She reports that  she does not use illicit drugs.  Allergies: No Known Allergies  Meds: Not taking any at this time  Results for orders placed or performed during the hospital encounter of 10/08/15 (from the past 48 hour(s))  CBG monitoring, ED     Status: Abnormal   Collection Time: 10/08/15  4:03 PM  Result Value Ref Range   Glucose-Capillary 130 (H) 65 - 99 mg/dL   Comment 1 Notify RN   Lipase, blood     Status: None   Collection Time: 10/08/15  4:09 PM  Result Value Ref Range   Lipase 18 11 - 51 U/L  Comprehensive metabolic panel     Status: Abnormal   Collection Time: 10/08/15  4:09 PM  Result Value Ref Range   Sodium 135 135 - 145 mmol/L   Potassium 3.2 (L) 3.5 - 5.1 mmol/L   Chloride 101 101 - 111 mmol/L   CO2 22 22 - 32 mmol/L   Glucose, Bld 135 (H) 65 - 99 mg/dL   BUN <0 (L) 6 - 20 mg/dL   Creatinine, Ser 0.72 0.44 - 1.00 mg/dL   Calcium 9.3 8.9 - 10.3 mg/dL   Total Protein 7.6 6.5 - 8.1 g/dL   Albumin 3.2 (L) 3.5 - 5.0 g/dL   AST 66 (H) 15 - 41 U/L   ALT 50 14 - 54 U/L   Alkaline Phosphatase 126 38 - 126 U/L   Total Bilirubin 0.6 0.3 - 1.2 mg/dL     GFR calc non Af Amer >60 >60 mL/min   GFR calc Af Amer >60 >60 mL/min    Comment: (NOTE) The eGFR has been calculated using the CKD EPI equation. This calculation has not been validated in all clinical situations. eGFR's persistently <60 mL/min signify possible Chronic Kidney Disease.    Anion gap 12 5 - 15  CBC     Status: Abnormal   Collection Time: 10/08/15  4:09 PM  Result Value Ref Range   WBC 6.4 4.0 - 10.5 K/uL   RBC 4.64 3.87 - 5.11 MIL/uL   Hemoglobin 14.8 12.0 - 15.0 g/dL   HCT 43.2 36.0 - 46.0 %   MCV 93.1 78.0 - 100.0 fL   MCH 31.9 26.0 - 34.0 pg   MCHC 34.3 30.0 - 36.0 g/dL   RDW 16.4 (H) 11.5 - 15.5 %   Platelets 289 150 - 400 K/uL  I-Stat beta hCG blood, ED     Status: None   Collection Time: 10/08/15  4:24 PM  Result Value Ref Range   I-stat hCG, quantitative <5.0 <5 mIU/mL   Comment 3            Comment:    GEST. AGE      CONC.  (mIU/mL)   <=1 WEEK        5 - 50     2 WEEKS       50 - 500     3 WEEKS       100 - 10,000     4 WEEKS     1,000 - 30,000        FEMALE AND NON-PREGNANT FEMALE:     LESS THAN 5 mIU/mL   Urinalysis, Routine w reflex microscopic     Status: Abnormal   Collection Time: 10/08/15  4:26 PM  Result Value Ref Range   Color, Urine ORANGE (A) YELLOW    Comment: BIOCHEMICALS MAY BE AFFECTED BY COLOR   APPearance TURBID (A) CLEAR   Specific Gravity, Urine 1.021 1.005 - 1.030   pH 5.5 5.0 - 8.0   Glucose, UA NEGATIVE NEGATIVE mg/dL   Hgb urine dipstick MODERATE (A) NEGATIVE   Bilirubin Urine SMALL (A) NEGATIVE   Ketones, ur 15 (A) NEGATIVE mg/dL   Protein, ur 30 (A) NEGATIVE mg/dL   Nitrite POSITIVE (A) NEGATIVE   Leukocytes, UA SMALL (A) NEGATIVE  Urine microscopic-add on     Status: Abnormal   Collection Time: 10/08/15  4:26 PM  Result Value Ref Range   Squamous Epithelial / LPF 6-30 (A) NONE SEEN   WBC, UA 6-30 0 - 5 WBC/hpf   RBC / HPF NONE SEEN 0 - 5 RBC/hpf   Bacteria, UA MANY (A) NONE SEEN   Urine-Other MUCOUS PRESENT   I-Stat Troponin, ED (not at [facility])     Status: Abnormal   Collection Time: 10/08/15  6:40 PM  Result Value Ref Range   Troponin i, poc 0.61 (HH) 0.00 - 0.08 ng/mL   Comment NOTIFIED PHYSICIAN    Comment 3            Comment: Due to the release kinetics of cTnI, a negative result within the first hours of the onset of symptoms does not rule out myocardial infarction with certainty. If myocardial infarction is still suspected, repeat the test at appropriate intervals.   I-Stat venous blood gas, ED     Status: Abnormal   Collection Time: 10/08/15  9:30 PM  Result Value Ref Range  pH, Ven 7.445 (H) 7.250 - 7.300   pCO2, Ven 35.7 (L) 45.0 - 50.0 mmHg   pO2, Ven 28.0 (L) 31.0 - 45.0 mmHg   Bicarbonate 24.5 (H) 20.0 - 24.0 mEq/L   TCO2 26 0 - 100 mmol/L   O2 Saturation 56.0 %   Acid-Base Excess 1.0 0.0 - 2.0 mmol/L   Patient temperature  HIDE    Sample type VENOUS    Comment NOTIFIED PHYSICIAN    Dg Chest Port 1 View  10/08/2015  CLINICAL DATA:  Epigastric pain EXAM: PORTABLE CHEST 1 VIEW COMPARISON:  08/01/2015 FINDINGS: Exam is lordotic. Cardiac silhouette is enlarged. No effusion, infiltrate, pneumothorax. No acute osseous abnormality. IMPRESSION: Cardiomegaly without acute cardiopulmonary findings. Electronically Signed   By: Stewart  Edmunds M.D.   On: 10/08/2015 19:19   Us Abdomen Limited Ruq  10/08/2015  CLINICAL DATA:  Abdominal pain. RIGHT upper quadrant pain for 1 week. EXAM: US ABDOMEN LIMITED - RIGHT UPPER QUADRANT COMPARISON:  None. FINDINGS: Gallbladder: No gallstones or wall thickening visualized. No sonographic Murphy sign noted by sonographer. Common bile duct: Diameter: Normal in diameter at 5 mm. Liver: Increased liver echogenicity.  No duct dilatation. IMPRESSION: 1. Normal gallbladder. 2. Increased liver echogenicity commonly represents hepatic steatosis. Electronically Signed   By: Stewart  Edmunds M.D.   On: 10/08/2015 18:30    ROS: As above plus weight loss. Otherwise, review of systems is negative unless per above HPI  Filed Vitals:   10/08/15 2010 10/08/15 2049 10/08/15 2100 10/08/15 2130  BP: 110/75 112/73 105/59 99/64  Pulse: 103 102 101 105  Temp:      TempSrc:      Resp: 21 21 19 17  Height:      Weight:      SpO2: 100% 100% 99% 99%   Wt Readings from Last 10 Encounters:  10/08/15 67.388 kg (148 lb 9 oz)  08/10/15 74.027 kg (163 lb 3.2 oz)  08/08/15 73.483 kg (162 lb)  08/04/15 73.71 kg (162 lb 8 oz)    PE:  General: No acute distress HEENT: Atraumatic, EOMI, mucous membranes moist CV: borderline tachycardia, poss S3, no murmurs. No JVD at 45 degrees. No HJR. Respiratory: Clear, no crackles. Normal work of breathing ABD: Non-distended. No palpable organomegaly. Focal pain in RUQ on palpation Extremities: 2+ radial pulses bilaterally. 1 + bilateral. Neuro/Psych: CN grossly intact,  alert and oriented  TTE 08/02/15 Study Conclusions  - Left ventricle: The cavity size was mildly dilated. Systolic  function was moderately reduced. The estimated ejection fraction  was in the range of 35% to 40%. Mild diffuse hypokinesis with  akinesis of the mid-apical anterior wall, mid-apical anteroseptum  and apex. Features are consistent with a pseudonormal left  ventricular filling pattern, with concomitant abnormal relaxation  and increased filling pressure (grade 2 diastolic dysfunction).  There was a thrombus. There was a 2.8 cm (L) x 1.4 cm (W),  apicalthrombus. - Aortic valve: Transvalvular velocity was within the normal range.  There was no stenosis. There was no regurgitation. - Mitral valve: Transvalvular velocity was within the normal range.  There was no evidence for stenosis. There was mild regurgitation. - Left atrium: The atrium was mildly dilated. - Right ventricle: The cavity size was normal. Wall thickness was  normal. Systolic function was mildly reduced. - Tricuspid valve: There was mild regurgitation. - Pulmonary arteries: Systolic pressure was within the normal  range. PA peak pressure: 21 mm Hg (S). - Inferior vena cava: The vessel was normal   in size. The  respirophasic diameter changes were in the normal range (>= 50%),  consistent with normal central venous pressure.  Cath 08/04/15  Ost RPDA lesion, 40% stenosed.  Dist RCA lesion, 50% stenosed.  Prox Cx to Mid Cx lesion, 35% stenosed.  Ost Ramus to Ramus lesion, 60% stenosed.  Prox RCA to Mid RCA lesion, 25% stenosed.  Prox LAD to Mid LAD lesion, 90% stenosed.  1. Diffuse coronary artery disease with small caliber vessels. The proximal to mid LAD has a segmental severe stenosis with appearance of dissection.   Assessment/Plan 44 year old female with epigastric symptoms, normal RUQ ultrasound, positive troponin in setting of known diffuse CAD with anterolateral QW and new  inferior TWI.  No concerning labs to suggest abdominal pathology/embolized LV thrombus (normal lactate, mild AST elevation stable from prior, negative lipase) and RUQ U/S normal.  Suspect RUQ tenderness which is chronic is likely capsule distention from hepatic steatosis.  Given that she never had classic angina, its possible this represents inferior ischemia.  Will treat empirically as ACS.  I'm not able to view old films, but pending review of these, would consider repeat catheterization to evaluate RCA/LCX for possible intervention as likely these likely supply viable myocardium.    NSTEMI - ACS heparin, s/p 325 ASA, ordered plavix 600 mg x 1  - daily ECG, tele bed, trend enzymes - atorva 80 - TTE limited ordered for wall motion - Likely cath Monday as above pending review  History of LV thrombus - Currently on ACS heparin, will need counseling regarding whether she needs chronic anticoagulation or not-- she was told this could stop after 1 month.  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   Ischemic cardiomyopathy EF 35-40% - Off medications, needs counseling on evidence based medications. - Restart metoprolol, holding off on ACE for now if getting dye load - Not on chronic diuretic.  Weights down trending, BNP 106  RUQ epigastric pain - negative RUQ ultrasound, LFTS stable/normal, favor liver capsul distention versus gastritis  Nausea/vommiting possible UTI - As above could be inferior ischemia +/- gastritis +/- UTI.   - s/p 1L NS in ED, will stop further fluids given CHF history - Anti nausea medications ordered prn - UA slightly dirty, will empirically treat with CTX  Tobacco abuse - 1 ppd, ordered patch  Gregory Thomas Means  MD 10/08/2015, 9:48 PM     

## 2015-10-08 NOTE — ED Notes (Signed)
Cardiology at bedside.

## 2015-10-08 NOTE — ED Notes (Signed)
MD at bedside. Dr.Little

## 2015-10-08 NOTE — Progress Notes (Signed)
CRITICAL VALUE ALERT  Critical value received:  Troponin 1.17  Date of notification:  10/08/15  Time of notification:  22:12  Critical value read back:Yes.    Nurse who received alert:  [place]   MD notified (1st page):  Means  Time of first page:  22:14  MD notified (2nd page):  Time of second page:  Responding MD:  Means  Time MD responded:  2300

## 2015-10-08 NOTE — ED Provider Notes (Signed)
CSN: [id number]     Arrival date & time 10/08/15  1551 History   First MD Initiated Contact with Patient 10/08/15 1635     Chief Complaint  Patient presents with  . Emesis  . fatigue      (Consider location/radiation/quality/duration/timing/severity/associated sxs/prior Treatment) Patient is a 45 y.o. female presenting with abdominal pain. The history is provided by the patient.  Abdominal Pain Pain location:  Epigastric Pain quality: aching   Pain radiates to:  Does not radiate Pain severity:  Moderate Onset quality:  Gradual Duration:  5 days Timing:  Constant Progression:  Worsening Chronicity:  New Relieved by:  Nothing Worsened by:  Nothing tried Ineffective treatments:  OTC medications Associated symptoms: nausea and vomiting   Associated symptoms: no chest pain, no chills, no cough, no diarrhea, no dysuria, no fever, no shortness of breath and no sore throat     Past Medical History  Diagnosis Date  . CAD (coronary artery disease)   . Ischemic cardiomyopathy    Past Surgical History  Procedure Laterality Date  . Cesarean section    . Cardiac catheterization N/A 08/04/2015    Procedure: Coronary/Graft Angiography;  Surgeon: Peter M Swaz[place], MD;  Location: [facility] INVASIVE CV LAB;  Service: Cardiovascular;  Laterality: N/A;   Family History  Problem Relation Age of Onset  . Diabetes Mellitus II Neg Hx   . CAD Neg Hx    Social History  Substance Use Topics  . Smoking status: Current Every Day Smoker -- 0.50 packs/day    Types: Cigarettes  . Smokeless tobacco: Never Used  . Alcohol Use: 0.0 oz/week    0 Standard drinks or equivalent per week     Comment: Two drinks a night.   OB History    No data available     Review of Systems  Constitutional: Negative for fever and chills.  HENT: Negative for congestion and sore throat.   Eyes: Negative for pain.  Respiratory: Negative for cough and shortness of breath.   Cardiovascular: Negative for chest pain,  palpitations and leg swelling.  Gastrointestinal: Positive for nausea, vomiting and abdominal pain. Negative for diarrhea.  Genitourinary: Negative for dysuria and flank pain.  Musculoskeletal: Negative for back pain and neck pain.  Skin: Negative for rash.  Allergic/Immunologic: Negative.   Neurological: Negative for dizziness and light-headedness.  Psychiatric/Behavioral: Negative for confusion.    Allergies  Review of patient's allergies indicates no known allergies.  Home Medications   Prior to Admission medications   Medication Sig Start Date End Date Taking? Authorizing Provider  apixaban (ELIQUIS) 5 MG TABS tablet Take 1 tablet (5 mg total) by mouth 2 (two) times daily. 08/10/15  Yes Newt Lukes, MD  atorvastatin (LIPITOR) 80 MG tablet Take 1 tablet (80 mg total) by mouth daily. 08/10/15  Yes Newt Lukes, MD  lisinopril (PRINIVIL,ZESTRIL) 5 MG tablet Take 1 tablet (5 mg total) by mouth daily. 08/10/15  Yes Newt Lukes, MD  metoprolol succinate (TOPROL-XL) 25 MG 24 hr tablet Take 1 tablet (25 mg total) by mouth daily. 08/10/15  Yes Newt Lukes, MD   BP 90/60 mmHg  Pulse 116  Temp(Src) 98.6 F (37 C) (Oral)  Resp 24  Ht 5\' 5"  (1.651 m)  Wt 67.388 kg  BMI 24.72 kg/m2  SpO2 93%  LMP  (LMP Unknown) Physical Exam  Constitutional: She is oriented to person, place, and time. She appears well-developed and well-nourished. No distress.  HENT:  Head: Normocephalic and atraumatic.  Eyes: Conjunctivae and EOM are normal. Pupils are equal, round, and reactive to light.  Neck: Normal range of motion. Neck supple.  Cardiovascular: Normal rate, regular rhythm and normal heart sounds.   Pulses:      Radial pulses are 2+ on the right side, and 2+ on the left side.  Pulmonary/Chest: Effort normal and breath sounds normal. No respiratory distress.  Abdominal: Soft. Bowel sounds are normal. There is tenderness in the epigastric area. There is positive Murphy's sign.  There is no rigidity, no rebound, no guarding, no CVA tenderness and no tenderness at McBurney's point.  Musculoskeletal: Normal range of motion.  Neurological: She is alert and oriented to person, place, and time. She has normal reflexes. No cranial nerve deficit.  Skin: Skin is warm and dry. She is not diaphoretic.  Psychiatric: She has a normal mood and affect.    ED Course  Procedures (including critical care time) Labs Review Labs Reviewed  COMPREHENSIVE METABOLIC PANEL - Abnormal; Notable for the following:    Potassium 3.2 (*)    Glucose, Bld 135 (*)    BUN <0 (*)    Albumin 3.2 (*)    AST 66 (*)    All other components within normal limits  CBC - Abnormal; Notable for the following:    RDW 16.4 (*)    All other components within normal limits  URINALYSIS, ROUTINE W REFLEX MICROSCOPIC (NOT AT [facility]) - Abnormal; Notable for the following:    Color, Urine ORANGE (*)    APPearance TURBID (*)    Hgb urine dipstick MODERATE (*)    Bilirubin Urine SMALL (*)    Ketones, ur 15 (*)    Protein, ur 30 (*)    Nitrite POSITIVE (*)    Leukocytes, UA SMALL (*)    All other components within normal limits  URINE MICROSCOPIC-ADD ON - Abnormal; Notable for the following:    Squamous Epithelial / LPF 6-30 (*)    Bacteria, UA MANY (*)    All other components within normal limits  CBG MONITORING, ED - Abnormal; Notable for the following:    Glucose-Capillary 130 (*)    All other components within normal limits  I-STAT TROPOININ, ED - Abnormal; Notable for the following:    Troponin i, poc 0.61 (*)    All other components within normal limits  URINE CULTURE  LIPASE, BLOOD  TROPONIN I  HEPARIN LEVEL (UNFRACTIONATED)  CBC  I-STAT BETA HCG BLOOD, ED ([facility], [facility], [facility] ONLY)    Imaging Review Dg Chest Port 1 View  10/08/2015  CLINICAL DATA:  Epigastric pain EXAM: PORTABLE CHEST 1 VIEW COMPARISON:  08/01/2015 FINDINGS: Exam is lordotic. Cardiac silhouette is enlarged. No effusion, infiltrate,  pneumothorax. No acute osseous abnormality. IMPRESSION: Cardiomegaly without acute cardiopulmonary findings. Electronically Signed   By: Genevive Bi M.D.   On: 10/08/2015 19:19   US Abdomen Limited Ruq  10/08/2015  CLINICAL DATA:  Abdominal pain. RIGHT upper quadrant pain for 1 week. EXAM: US ABDOMEN LIMITED - RIGHT UPPER QUADRANT COMPARISON:  None. FINDINGS: Gallbladder: No gallstones or wall thickening visualized. No sonographic Murphy sign noted by sonographer. Common bile duct: Diameter: Normal in diameter at 5 mm. Liver: Increased liver echogenicity.  No duct dilatation. IMPRESSION: 1. Normal gallbladder. 2. Increased liver echogenicity commonly represents hepatic steatosis. Electronically Signed   By: Genevive Bi M.D.   On: 10/08/2015 18:30   I have personally reviewed and evaluated these images and lab results as part of my medical decision-making.  EKG Interpretation   Date/Time:  Saturday October 08 2015 17:06:32 EDT Ventricular Rate:  107 PR Interval:    QRS Duration: 84 QT Interval:  364 QTC Calculation: 486 R Axis:   107 Text Interpretation:  Sinus tachycardia Probable anterolateral infarct,  age indeterm Abnormal T, consider ischemia, inferior leads new T wave  inversions in III, aVF, V3, V6 T wave inversions in V4-V5 present on  previous EKG Poor R wave progression Confirmed by LITTLE MD, RACHEL  [phone]) on 10/08/2015 6:02:52 PM      MDM   Final diagnoses:  Abdominal pain    The pt is 45 yo female with a hx of "silent MI" presenting for epigastric abdominal pain associated with n/v over the last 5 days.  Denies any CP, SOB, or diarrhea.   On exam mild RUQ tenderness. [place] RUQ negative for chole.  ECG displayed new T wave inversions in inferior leads and trop elevated.  ASA given and heparin gtt started.  Consulted cardiology who evaluated the pt in the ED and agree to admission.   Labs, CXR, and ECG were viewed by myself and incorporated into medical decision  making.  Discussed pertinent finding with patient or caregiver prior to admission with no further questions.  Pt care supervised by my attending Dr. Clarene Duke.   Tery Sanfilippo, MD PGY-3 Emergency Medicine     Tery Sanfilippo, MD 10/09/15 0981  Laurence Spates, MD 10/09/15 [phone]

## 2015-10-08 NOTE — Progress Notes (Signed)
ANTICOAGULATION CONSULT NOTE - Follow Up Consult  Pharmacy Consult for Heparin Indication: hx LV thrombus, elevated troponin  No Known Allergies  Patient Measurements: Height: 5\' 5"  (165.1 cm) Weight: 148 lb 9 oz (67.388 kg) IBW/kg (Calculated) : 57  Vital Signs: Temp: 98.6 F (37 C) (07/08 1600) Temp Source: Oral (07/08 1600) BP: 90/60 mmHg (07/08 1900) Pulse Rate: 116 (07/08 1900)  Labs:  Recent Labs  10/08/15 1609  HGB 14.8  HCT 43.2  PLT 289  CREATININE 0.72    Estimated Creatinine Clearance: 80.8 mL/min (by C-G formula based on Cr of 0.72).  Assessment: 44yof with hx LV thrombus, supposed to be on eliquis but hasn't taken it in a month, now with elevated troponin. Pharmacy asked to begin IV heparin. Baseline CBC wnl.  Goal of Therapy:  Heparin level 0.3-0.7 units/ml Monitor platelets by anticoagulation protocol: Yes   Plan:  1) Heparin bolus 4000 units 2) Heparin drip at 1000 units/hr 3) 6 hour heparin level 4) Daily heparin level and CBC  Fredrik Rigger 10/08/2015,7:35 PM

## 2015-10-08 NOTE — ED Notes (Addendum)
Patient here with 1 week of nausea and vomiting and decreased appetite with fatigue. Denies abdominal pain. Actively vomiting on arrival. Alert and oriented. denies diarrhea

## 2015-10-09 ENCOUNTER — Encounter (HOSPITAL_COMMUNITY): Payer: Self-pay

## 2015-10-09 DIAGNOSIS — I213 ST elevation (STEMI) myocardial infarction of unspecified site: Secondary | ICD-10-CM

## 2015-10-09 DIAGNOSIS — I255 Ischemic cardiomyopathy: Secondary | ICD-10-CM

## 2015-10-09 DIAGNOSIS — I214 Non-ST elevation (NSTEMI) myocardial infarction: Principal | ICD-10-CM

## 2015-10-09 DIAGNOSIS — I25110 Atherosclerotic heart disease of native coronary artery with unstable angina pectoris: Secondary | ICD-10-CM

## 2015-10-09 LAB — COMPREHENSIVE METABOLIC PANEL
ALK PHOS: 126 U/L (ref 38–126)
ALT: 50 U/L (ref 14–54)
ANION GAP: 12 (ref 5–15)
AST: 66 U/L — ABNORMAL HIGH (ref 15–41)
Albumin: 3.2 g/dL — ABNORMAL LOW (ref 3.5–5.0)
BUN: <5 mg/dL — ABNORMAL LOW (ref 6–20)
CALCIUM: 9.3 mg/dL (ref 8.9–10.3)
CO2: 22 mmol/L (ref 22–32)
Chloride: 101 mmol/L (ref 101–111)
Creatinine, Ser: 0.72 mg/dL (ref 0.44–1.00)
GFR calc non Af Amer: >60 mL/min (ref >60)
Glucose, Bld: 135 mg/dL — ABNORMAL HIGH (ref 65–99)
Potassium: 3.2 mmol/L — ABNORMAL LOW (ref 3.5–5.1)
SODIUM: 135 mmol/L (ref 135–145)
Total Bilirubin: 0.6 mg/dL (ref 0.3–1.2)
Total Protein: 7.6 g/dL (ref 6.5–8.1)

## 2015-10-09 LAB — HEPARIN LEVEL (UNFRACTIONATED)
HEPARIN UNFRACTIONATED: 0.39 [IU]/mL (ref 0.30–0.70)
HEPARIN UNFRACTIONATED: 0.5 [IU]/mL (ref 0.30–0.70)

## 2015-10-09 LAB — TROPONIN I
TROPONIN I: 0.9 ng/mL — AB (ref <0.03)
TROPONIN I: 0.91 ng/mL — AB (ref <0.03)
TROPONIN I: 1.12 ng/mL — AB (ref <0.03)

## 2015-10-09 LAB — BASIC METABOLIC PANEL
Anion gap: 7 (ref 5–15)
BUN: <5 mg/dL — ABNORMAL LOW (ref 6–20)
CHLORIDE: 102 mmol/L (ref 101–111)
CO2: 26 mmol/L (ref 22–32)
Calcium: 8.7 mg/dL — ABNORMAL LOW (ref 8.9–10.3)
Creatinine, Ser: 0.61 mg/dL (ref 0.44–1.00)
GFR calc non Af Amer: >60 mL/min (ref >60)
Glucose, Bld: 96 mg/dL (ref 65–99)
POTASSIUM: 3.4 mmol/L — AB (ref 3.5–5.1)
SODIUM: 135 mmol/L (ref 135–145)

## 2015-10-09 LAB — CBC
HEMATOCRIT: 35 % — AB (ref 36.0–46.0)
HEMOGLOBIN: 11.7 g/dL — AB (ref 12.0–15.0)
MCH: 31.7 pg (ref 26.0–34.0)
MCHC: 33.4 g/dL (ref 30.0–36.0)
MCV: 94.9 fL (ref 78.0–100.0)
Platelets: 233 10*3/uL (ref 150–400)
RBC: 3.69 MIL/uL — AB (ref 3.87–5.11)
RDW: 16.7 % — ABNORMAL HIGH (ref 11.5–15.5)
WBC: 5.9 10*3/uL (ref 4.0–10.5)

## 2015-10-09 LAB — PROTIME-INR
INR: 1.1 (ref 0.00–1.49)
PROTHROMBIN TIME: 14.4 s (ref 11.6–15.2)

## 2015-10-09 MED ORDER — SODIUM CHLORIDE 0.9 % IV SOLN
INTRAVENOUS | Status: DC
Start: 2015-10-10 — End: 2015-10-10
  Administered 2015-10-10: 05:00:00 via INTRAVENOUS

## 2015-10-09 MED ORDER — ASPIRIN 81 MG PO CHEW
81.0000 mg | CHEWABLE_TABLET | ORAL | Status: AC
  Administered 2015-10-10: 81 mg via ORAL
  Filled 2015-10-09: qty 1

## 2015-10-09 MED ORDER — CLOPIDOGREL BISULFATE 300 MG PO TABS
300.0000 mg | ORAL_TABLET | Freq: Once | ORAL | Status: AC
  Administered 2015-10-09: 300 mg via ORAL
  Filled 2015-10-09: qty 1

## 2015-10-09 MED ORDER — SODIUM CHLORIDE 0.9% FLUSH: 3.0000 mL | Freq: Two times a day (BID) | INTRAVENOUS | Status: DC

## 2015-10-09 MED ORDER — POTASSIUM CHLORIDE CRYS ER 20 MEQ PO TBCR
40.0000 meq | EXTENDED_RELEASE_TABLET | Freq: Once | ORAL | Status: AC
  Administered 2015-10-09: 40 meq via ORAL
  Filled 2015-10-09: qty 2

## 2015-10-09 MED ORDER — SODIUM CHLORIDE 0.9% FLUSH: 3.0000 mL | INTRAVENOUS | Status: DC | PRN

## 2015-10-09 MED ORDER — SODIUM CHLORIDE 0.9 % IV SOLN: 250.0000 mL | INTRAVENOUS | Status: DC | PRN

## 2015-10-09 MED ORDER — CEPHALEXIN 500 MG PO CAPS
500.0000 mg | ORAL_CAPSULE | Freq: Four times a day (QID) | ORAL | Status: AC
  Administered 2015-10-09 – 2015-10-14 (×20): 500 mg via ORAL
  Filled 2015-10-09 (×19): qty 1

## 2015-10-09 NOTE — Progress Notes (Signed)
ANTICOAGULATION CONSULT NOTE - Follow Up Consult  Pharmacy Consult for Heparin  Indication: History of LV thrombus, elevated troponin  No Known Allergies  Patient Measurements: Height: 5\' 5"  (165.1 cm) Weight: 151 lb 7.3 oz (68.7 kg) IBW/kg (Calculated) : 57   Vital Signs: Temp: 98.5 F (36.9 C) (07/08 2201) Temp Source: Oral (07/08 2201) BP: 119/72 mmHg (07/08 2201) Pulse Rate: 116 (07/08 2201)  Labs:  Recent Labs  10/08/15 1609 10/08/15 2132 10/09/15 0019  HGB 14.8  --   --   HCT 43.2  --   --   PLT 289  --   --   HEPARINUNFRC  --   --  0.50  CREATININE 0.72  --   --   TROPONINI  --  1.17*  --     Estimated Creatinine Clearance: 87.4 mL/min (by C-G formula based on Cr of 0.72).  Assessment: 45 y/o F with hx LV thrombus but non-compliant with Apixaban, now with elevated troponin and on heparin, initial heparin level is good at 0.5  Goal of Therapy:  Heparin level 0.3-0.7 units/ml Monitor platelets by anticoagulation protocol: Yes   Plan:  -Cont heparin at 1000 units/hr -0800 HL to confirm  Abran Duke 10/09/2015,12:47 AM

## 2015-10-09 NOTE — Progress Notes (Signed)
ANTICOAGULATION CONSULT NOTE - Follow Up Consult  Pharmacy Consult for Heparin  Indication: History of LV thrombus, elevated troponin  No Known Allergies  Patient Measurements: Height: 5\' 5"  (165.1 cm) Weight: 151 lb 14.4 oz (68.9 kg) IBW/kg (Calculated) : 57   Vital Signs: Temp: 98.6 F (37 C) (07/09 0549) Temp Source: Oral (07/09 0549) BP: 113/72 mmHg (07/09 0549) Pulse Rate: 86 (07/09 0549)  Labs:  Recent Labs  10/08/15 1609 10/08/15 2132 10/09/15 0019 10/09/15 0845  HGB 14.8  --   --  11.7*  HCT 43.2  --   --  35.0*  PLT 289  --   --  233  LABPROT  --   --   --  14.4  INR  --   --   --  1.10  HEPARINUNFRC  --   --  0.50 0.39  CREATININE 0.72  --   --   --   TROPONINI  --  1.17* 1.12*  --     Estimated Creatinine Clearance: 87.6 mL/min (by C-G formula based on Cr of 0.72).  Assessment: 45 y/o F with hx LV thrombus supposed to be on eliquis pta but hasnt taken in a month, now with elevated troponin and to start heparin gtt. Last HL remains therapeutic at 0.39. Hgb low at 11.7, plts wnl. No s/s of bleed.  Goal of Therapy:  Heparin level 0.3-0.7 units/ml Monitor platelets by anticoagulation protocol: Yes   Plan:  Continue heparin gtt at 1,000 units/hr Monitor daily HL, CBC, s/s of bleed  Enzo Bi, PharmD, [facility] Clinical Pharmacist Pager [phone] 10/09/2015 9:39 AM

## 2015-10-09 NOTE — Progress Notes (Signed)
SUBJECTIVE:  No further CP  OBJECTIVE:   Vitals:   Filed Vitals:   10/08/15 2130 10/08/15 2201 10/09/15 0549 10/09/15 1012  BP: 99/64 119/72 113/72 110/68  Pulse: 105 116 86 87  Temp:  98.5 F (36.9 C) 98.6 F (37 C)   TempSrc:  Oral Oral   Resp: 17 18 18    Height:  5\' 5"  (1.651 m)    Weight:  151 lb 7.3 oz (68.7 kg) 151 lb 14.4 oz (68.9 kg)   SpO2: 99% 100% 100%    I&O's:   Intake/Output Summary (Last 24 hours) at 10/09/15 1021 Last data filed at 10/08/15 1836  Gross per 24 hour  Intake   1000 ml  Output      0 ml  Net   1000 ml   TELEMETRY: Reviewed telemetry pt in NSR:     PHYSICAL EXAM General: Well developed, well nourished, in no acute distress Head: Eyes PERRLA, No xanthomas.   Normal cephalic and atramatic  Lungs:   Clear bilaterally to auscultation and percussion. Heart:   HRRR S1 S2 Pulses are 2+ & equal. Abdomen: Bowel sounds are positive, abdomen soft and non-tender without masses Extremities:   No clubbing, cyanosis or edema.  DP +1 Neuro: Alert and oriented X 3. Psych:  Good affect, responds appropriately   LABS: Basic Metabolic Panel:  Recent Labs  95/32/02 1609 10/09/15 0845  NA 135 135  K 3.2* 3.4*  CL 101 102  CO2 22 26  GLUCOSE 135* 96  BUN <0* <5*  CREATININE 0.72 0.61  CALCIUM 9.3 8.7*   Liver Function Tests:  Recent Labs  10/08/15 1609  AST 66*  ALT 50  ALKPHOS 126  BILITOT 0.6  PROT 7.6  ALBUMIN 3.2*    Recent Labs  10/08/15 1609  LIPASE 18   CBC:  Recent Labs  10/08/15 1609 10/09/15 0845  WBC 6.4 5.9  HGB 14.8 11.7*  HCT 43.2 35.0*  MCV 93.1 94.9  PLT 289 233   Cardiac Enzymes:  Recent Labs  10/08/15 2132 10/09/15 0019 10/09/15 0845  TROPONINI 1.17* 1.12* 0.90*   BNP: Invalid input(s): POCBNP D-Dimer: No results for input(s): DDIMER in the last 72 hours. Hemoglobin A1C: No results for input(s): HGBA1C in the last 72 hours. Fasting Lipid Panel: No results for input(s): CHOL, HDL, LDLCALC,  TRIG, CHOLHDL, LDLDIRECT in the last 72 hours. Thyroid Function Tests: No results for input(s): TSH, T4TOTAL, T3FREE, THYROIDAB in the last 72 hours.  Invalid input(s): FREET3 Anemia Panel: No results for input(s): VITAMINB12, FOLATE, FERRITIN, TIBC, IRON, RETICCTPCT in the last 72 hours. Coag Panel:   Lab Results  Component Value Date   INR 1.10 10/09/2015   INR 1.02 08/03/2015    RADIOLOGY: Dg Chest Port 1 View  10/08/2015  CLINICAL DATA:  Epigastric pain EXAM: PORTABLE CHEST 1 VIEW COMPARISON:  08/01/2015 FINDINGS: Exam is lordotic. Cardiac silhouette is enlarged. No effusion, infiltrate, pneumothorax. No acute osseous abnormality. IMPRESSION: Cardiomegaly without acute cardiopulmonary findings. Electronically Signed   By: Genevive Bi M.D.   On: 10/08/2015 19:19   US Abdomen Limited Ruq  10/08/2015  CLINICAL DATA:  Abdominal pain. RIGHT upper quadrant pain for 1 week. EXAM: US ABDOMEN LIMITED - RIGHT UPPER QUADRANT COMPARISON:  None. FINDINGS: Gallbladder: No gallstones or wall thickening visualized. No sonographic Murphy sign noted by sonographer. Common bile duct: Diameter: Normal in diameter at 5 mm. Liver: Increased liver echogenicity.  No duct dilatation. IMPRESSION: 1. Normal gallbladder. 2. Increased liver  echogenicity commonly represents hepatic steatosis. Electronically Signed   By: Genevive Bi M.D.   On: 10/08/2015 18:30    Cath 08/04/15  Ost RPDA lesion, 40% stenosed.  Dist RCA lesion, 50% stenosed.  Prox Cx to Mid Cx lesion, 35% stenosed.  Ost Ramus to Ramus lesion, 60% stenosed.  Prox RCA to Mid RCA lesion, 25% stenosed.  Prox LAD to Mid LAD lesion, 90% stenosed.  1. Diffuse coronary artery disease with small caliber vessels. The proximal to mid LAD has a segmental severe stenosis with appearance of dissection.   Assessment/Plan 45 year old female with epigastric symptoms, normal RUQ ultrasound, positive troponin in setting of known diffuse CAD with  anterolateral QW and new inferior TWI. No concerning labs to suggest abdominal pathology/embolized LV thrombus (normal lactate, mild AST elevation stable from prior, negative lipase) and RUQ U/S normal. Suspect RUQ tenderness which is chronic is likely capsule distention from hepatic steatosis. Given that she never had classic angina, its possible this represents inferior ischemia. Will treat empirically as ACS.  NSTEMI - Trops trending downward - ACS heparin, ASA, BB, statin.  Got 1 dose of Plavix last night.  - TTE limited ordered for wall motion - Plan cath tomorrow - Cardiac catheterization was discussed with the patient fully. The patient understands that risks include but are not limited to stroke (1 in 1000), death (1 in 1000), kidney failure [usually temporary] (1 in 500), bleeding (1 in 200), allergic reaction [possibly serious] (1 in 200).  The patient understands and is willing to proceed.    ASCAD with recent cath with 90% prox LAD with possible dissection and akinetic anterior wall - medical management as it appeared to be a completed anterolateral infarct. She was not on ASA due to  NOAC.    History of LV thrombus - Currently on ACS heparin, will need counseling regarding whether she needs chronic anticoagulation or not-- she was told this could stop after 1 month. There is no data for LV thrombus and NOACs but given concern for compliance in followup with warfarin she was placed on Eliquis. 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 Repeat 2D echo pending.   Ischemic cardiomyopathy EF 35-40% - Noncompliance with meds - stopped after 1 month - Restarted metoprolol, holding off on ACE for now and will restart after cath - Not on chronic diuretic. Weights down trending, BNP 106.    RUQ epigastric pain - negative RUQ ultrasound, LFTS stable/normal  Nausea/vommiting possible UTI - As above could be inferior ischemia +/- gastritis +/- UTI.  - s/p 1L NS in  ED - Anti nausea medications ordered prn - UA slightly dirty.  Started on IV ceftriaxone last night.  Change to Keflex  q6 hours for 5 days pending culture results.  Tobacco abuse - 1 ppd, ordered patch  Hypokalemia - replete   Armanda Magic, MD  10/09/2015  10:21 AM

## 2015-10-09 NOTE — Progress Notes (Signed)
Lab called with a corrected critical lab value of BUN <5 from 7/82017. Original was <0.

## 2015-10-10 ENCOUNTER — Other Ambulatory Visit (HOSPITAL_COMMUNITY): Payer: Self-pay

## 2015-10-10 ENCOUNTER — Encounter (HOSPITAL_COMMUNITY)
Admission: EM | Disposition: A | Discharge status: Home / Self Care | Payer: Self-pay | Source: Home / Self Care | Attending: Internal Medicine

## 2015-10-10 DIAGNOSIS — E876 Hypokalemia: Secondary | ICD-10-CM

## 2015-10-10 DIAGNOSIS — I252 Old myocardial infarction: Secondary | ICD-10-CM

## 2015-10-10 DIAGNOSIS — I2510 Atherosclerotic heart disease of native coronary artery without angina pectoris: Secondary | ICD-10-CM

## 2015-10-10 HISTORY — PX: CARDIAC CATHETERIZATION: SHX172

## 2015-10-10 LAB — CBC
HCT: 34.5 % — ABNORMAL LOW (ref 36.0–46.0)
Hemoglobin: 11.7 g/dL — ABNORMAL LOW (ref 12.0–15.0)
MCH: 32 pg (ref 26.0–34.0)
MCHC: 33.9 g/dL (ref 30.0–36.0)
MCV: 94.3 fL (ref 78.0–100.0)
PLATELETS: 253 10*3/uL (ref 150–400)
RBC: 3.66 MIL/uL — AB (ref 3.87–5.11)
RDW: 16.7 % — AB (ref 11.5–15.5)
WBC: 6.4 10*3/uL (ref 4.0–10.5)

## 2015-10-10 LAB — URINE CULTURE: Culture: >=100000 — AB

## 2015-10-10 LAB — HEPARIN LEVEL (UNFRACTIONATED): Heparin Unfractionated: 0.26 IU/mL — ABNORMAL LOW (ref 0.30–0.70)

## 2015-10-10 SURGERY — LEFT HEART CATH AND CORONARY ANGIOGRAPHY

## 2015-10-10 MED ORDER — FENTANYL CITRATE (PF) 100 MCG/2ML IJ SOLN
INTRAMUSCULAR | Status: DC | PRN
  Administered 2015-10-10 (×2): 25 ug via INTRAVENOUS

## 2015-10-10 MED ORDER — ACETAMINOPHEN 325 MG PO TABS: 650.0000 mg | ORAL_TABLET | ORAL | Status: DC | PRN

## 2015-10-10 MED ORDER — PANTOPRAZOLE SODIUM 40 MG PO TBEC
40.0000 mg | DELAYED_RELEASE_TABLET | Freq: Every day | ORAL | Status: DC
  Administered 2015-10-10 – 2015-10-14 (×5): 40 mg via ORAL
  Filled 2015-10-10 (×5): qty 1

## 2015-10-10 MED ORDER — HEPARIN (PORCINE) IN NACL 2-0.9 UNIT/ML-% IJ SOLN
INTRAMUSCULAR | Status: DC | PRN
  Administered 2015-10-10: 1000 mL

## 2015-10-10 MED ORDER — LIDOCAINE HCL (PF) 1 % IJ SOLN
INTRAMUSCULAR | Status: AC
  Filled 2015-10-10: qty 30

## 2015-10-10 MED ORDER — SODIUM CHLORIDE 0.9% FLUSH: 3.0000 mL | INTRAVENOUS | Status: DC | PRN

## 2015-10-10 MED ORDER — ONDANSETRON HCL 4 MG/2ML IJ SOLN: 4.0000 mg | Freq: Four times a day (QID) | INTRAMUSCULAR | Status: DC | PRN

## 2015-10-10 MED ORDER — FENTANYL CITRATE (PF) 100 MCG/2ML IJ SOLN: 12.5000 ug | INTRAMUSCULAR | Status: DC | PRN

## 2015-10-10 MED ORDER — SODIUM CHLORIDE 0.9 % IV SOLN
INTRAVENOUS | Status: DC
  Administered 2015-10-11: via INTRAVENOUS

## 2015-10-10 MED ORDER — SODIUM CHLORIDE 0.9% FLUSH
3.0000 mL | Freq: Two times a day (BID) | INTRAVENOUS | Status: DC
  Administered 2015-10-12: 3 mL via INTRAVENOUS

## 2015-10-10 MED ORDER — FENTANYL CITRATE (PF) 100 MCG/2ML IJ SOLN
INTRAMUSCULAR | Status: AC
  Filled 2015-10-10: qty 2

## 2015-10-10 MED ORDER — MIDAZOLAM HCL 2 MG/2ML IJ SOLN
INTRAMUSCULAR | Status: DC | PRN
  Administered 2015-10-10: 1 mg via INTRAVENOUS
  Administered 2015-10-10: 2 mg via INTRAVENOUS

## 2015-10-10 MED ORDER — MIDAZOLAM HCL 2 MG/2ML IJ SOLN
INTRAMUSCULAR | Status: AC
  Filled 2015-10-10: qty 2

## 2015-10-10 MED ORDER — LIDOCAINE HCL (PF) 1 % IJ SOLN
INTRAMUSCULAR | Status: DC | PRN
  Administered 2015-10-10: 19 mL
  Administered 2015-10-10: 5 mL
  Administered 2015-10-10: 16 mL

## 2015-10-10 MED ORDER — SODIUM CHLORIDE 0.9 % IV SOLN: 250.0000 mL | INTRAVENOUS | Status: DC | PRN

## 2015-10-10 MED ORDER — ASPIRIN EC 81 MG PO TBEC
81.0000 mg | DELAYED_RELEASE_TABLET | Freq: Every day | ORAL | Status: DC
  Administered 2015-10-11 – 2015-10-14 (×4): 81 mg via ORAL
  Filled 2015-10-10 (×4): qty 1

## 2015-10-10 MED ORDER — IOPAMIDOL (ISOVUE-370) INJECTION 76%
INTRAVENOUS | Status: DC | PRN
  Administered 2015-10-10: 90 mL via INTRAVENOUS

## 2015-10-10 MED ORDER — DIAZEPAM 5 MG PO TABS: 5.0000 mg | ORAL_TABLET | ORAL | Status: DC | PRN

## 2015-10-10 MED ORDER — HEPARIN (PORCINE) IN NACL 100-0.45 UNIT/ML-% IJ SOLN
1100.0000 [IU]/h | INTRAMUSCULAR | Status: AC
  Administered 2015-10-11: 1100 [IU]/h via INTRAVENOUS
  Filled 2015-10-10: qty 250

## 2015-10-10 MED ORDER — POTASSIUM CHLORIDE IN NACL 20-0.9 MEQ/L-% IV SOLN
INTRAVENOUS | Status: DC
  Administered 2015-10-12 – 2015-10-14 (×4): via INTRAVENOUS
  Filled 2015-10-10 (×7): qty 1000

## 2015-10-10 SURGICAL SUPPLY — 8 items
CATH INFINITI 5FR MULTPACK ANG (CATHETERS) ×3 IMPLANT
KIT HEART LEFT (KITS) ×3 IMPLANT
PACK CARDIAC CATHETERIZATION (CUSTOM PROCEDURE TRAY) ×3 IMPLANT
SHEATH PINNACLE 5F 10CM (SHEATH) ×3 IMPLANT
SYR MEDRAD MARK V 150ML (SYRINGE) ×3 IMPLANT
TRANSDUCER W/STOPCOCK (MISCELLANEOUS) ×3 IMPLANT
TUBING CIL FLEX 10 FLL-RA (TUBING) IMPLANT
WIRE EMERALD 3MM-J .035X150CM (WIRE) ×3 IMPLANT

## 2015-10-10 NOTE — Interval H&P Note (Signed)
History and Physical Interval Note:  10/10/2015 3:07 PM Cath Lab Visit (complete for each Cath Lab visit)  Clinical Evaluation Leading to the Procedure:   ACS: Yes.    Non-ACS:    Anginal Classification: CCS III  Anti-ischemic medical therapy: Maximal Therapy (2 or more classes of medications)  Non-Invasive Test Results: No non-invasive testing performed  Prior CABG: No previous CABG        [name]  has presented today for surgery, with the diagnosis of cp  The various methods of treatment have been discussed with the patient and family. After consideration of risks, benefits and other options for treatment, the patient has consented to  Procedure(s): Left Heart Cath and Coronary Angiography (N/A) as a surgical intervention .  The patient's history has been reviewed, patient examined, no change in status, stable for surgery.  I have reviewed the patient's chart and labs.  Questions were answered to the patient's satisfaction.     Casey [name]

## 2015-10-10 NOTE — Progress Notes (Signed)
ANTICOAGULATION CONSULT NOTE - Follow Up Consult  Pharmacy Consult for Heparin  Indication: History of LV thrombus, elevated troponin  No Known Allergies  Patient Measurements: Height: 5\' 5"  (165.1 cm) Weight: 152 lb 3.2 oz (69.037 kg) IBW/kg (Calculated) : 57   Vital Signs: Temp: 98.2 F (36.8 C) (07/10 1432) Temp Source: Oral (07/10 1432) BP: 102/60 mmHg (07/10 1730) Pulse Rate: 70 (07/10 1730)  Labs:  Recent Labs  10/08/15 1609  10/09/15 0019 10/09/15 0845 10/09/15 1153 10/10/15 0312 10/10/15 0313  HGB 14.8  --   --  11.7*  --   --  11.7*  HCT 43.2  --   --  35.0*  --   --  34.5*  PLT 289  --   --  233  --   --  253  LABPROT  --   --   --  14.4  --   --   --   INR  --   --   --  1.10  --   --   --   HEPARINUNFRC  --   --  0.50 0.39  --  0.26*  --   CREATININE 0.72  --   --  0.61  --   --   --   TROPONINI  --   < > 1.12* 0.90* 0.91*  --   --   < > = values in this interval not displayed.  Estimated Creatinine Clearance: 87.6 mL/min (by C-G formula based on Cr of 0.61).  Assessment: 45 y/o F with hx LV thrombus , Eliquis on hold for cath today, for heparin  PM f/u > pt now s/p cath, pharmacy asked to resume heparin 8 hrs after sheath removed.  Sheath out at 1730 PM.  Goal of Therapy:  Heparin level 0.3-0.7 units/ml Monitor platelets by anticoagulation protocol: Yes   Plan:  1. Resume heparin at 0130 AM on 10/11/15. 2. Check heparin level 6 hrs after heparin resumed. 3. Daily heparin level and CBC. 4. F/u plans for oral anticoagulation.  Tad Moore, BCPS  Clinical Pharmacist Pager [phone]  10/10/2015 7:43 PM

## 2015-10-10 NOTE — Progress Notes (Signed)
Patient Name: Casey [name] Date of Encounter: 10/10/2015  Principal Problem:   NSTEMI (non-ST elevated myocardial infarction) [facility]) Active Problems:   Old anterior myocardial infarction   Tobacco abuse   Epigastric pain   LV (left ventricular) mural thrombus (HCC)   Cardiomyopathy, ischemic   Coronary atherosclerosis of native coronary artery   Primary Cardiologist: Dr Jens Som  Patient Profile: 45 yo female w/ hx cath 08/2015 w/ 90% prox-mid LAD (possible dissection), 60% RI, 50% RCA, EF 35-40%, MI was felt completed OOH, med rx. She was off all rx. Admitted 07/09 w/ N&V, abd pain (same sx as 08/2015), trop elevated 1.2.  SUBJECTIVE: Denies CP or SOB, has been nauseated this am. abd pain present on admit had resolved, but came back this am.  OBJECTIVE Filed Vitals:   10/09/15 1012 10/09/15 1500 10/09/15 2031 10/10/15 0454  BP: 110/68 99/57 107/66 93/64  Pulse: 87 99 86 83  Temp:  98.3 F (36.8 C) 98.5 F (36.9 C) 98.3 F (36.8 C)  TempSrc:  Oral Oral Oral  Resp:  Height:      Weight:    152 lb 3.2 oz (69.037 kg)  SpO2:  100% 100% 100%    Intake/Output Summary (Last 24 hours) at 10/10/15 0946 Last data filed at 10/10/15 0700  Gross per 24 hour  Intake    360 ml  Output      0 ml  Net    360 ml   Filed Weights   10/08/15 2201 10/09/15 0549 10/10/15 0454  Weight: 151 lb 7.3 oz (68.7 kg) 151 lb 14.4 oz (68.9 kg) 152 lb 3.2 oz (69.037 kg)    PHYSICAL EXAM General: Well developed, well nourished, female in no acute distress. Head: Normocephalic, atraumatic.  Neck: Supple without bruits, JVD not elevated. Lungs:  Resp regular and unlabored, CTA. Heart: RRR, S1, S2, no S3, S4, or murmur; no rub. Abdomen: Soft, tender (worst RUQ), non-distended, BS + x 4.  Extremities: No clubbing, cyanosis, edema.  Neuro: Alert and oriented X 3. Moves all extremities spontaneously. Psych: Normal affect.  LABS: CBC: Recent Labs  10/09/15 0845  10/10/15 0313  WBC 5.9 6.4  HGB 11.7* 11.7*  HCT 35.0* 34.5*  MCV 94.9 94.3  PLT 233 253   INR: Recent Labs  10/09/15 0845  INR 1.10   Basic Metabolic Panel: Recent Labs  10/08/15 1609 10/09/15 0845  NA 135 135  K 3.2* 3.4*  CL 101 102  CO2 22 26  GLUCOSE 135* 96  BUN <5* <5*  CREATININE 0.72 0.61  CALCIUM 9.3 8.7*   Liver Function Tests: Recent Labs  10/08/15 1609  AST 66*  ALT 50  ALKPHOS 126  BILITOT 0.6  PROT 7.6  ALBUMIN 3.2*   Cardiac Enzymes: Recent Labs  10/09/15 0019 10/09/15 0845 10/09/15 1153  TROPONINI 1.12* 0.90* 0.91*    Recent Labs  10/08/15 1840  TROPIPOC 0.61*   BNP:  B NATRIURETIC PEPTIDE  Date/Time Value Ref Range Status  10/08/2015 09:33 PM 106.4* 0.0 - 100.0 pg/mL Final   TELE:   SR     Radiology/Studies: Dg Chest Port 1 View 10/08/2015  CLINICAL DATA:  Epigastric pain EXAM: PORTABLE CHEST 1 VIEW COMPARISON:  08/01/2015 FINDINGS: Exam is lordotic. Cardiac silhouette is enlarged. No effusion, infiltrate, pneumothorax. No acute osseous abnormality. IMPRESSION: Cardiomegaly without acute cardiopulmonary findings. Electronically Signed   By: Genevive Bi M.D.   On: 10/08/2015 19:19   US Abdomen Limited  Ruq 10/08/2015  CLINICAL DATA:  Abdominal pain. RIGHT upper quadrant pain for 1 week. EXAM: US ABDOMEN LIMITED - RIGHT UPPER QUADRANT COMPARISON:  None. FINDINGS: Gallbladder: No gallstones or wall thickening visualized. No sonographic Murphy sign noted by sonographer. Common bile duct: Diameter: Normal in diameter at 5 mm. Liver: Increased liver echogenicity.  No duct dilatation. IMPRESSION: 1. Normal gallbladder. 2. Increased liver echogenicity commonly represents hepatic steatosis. Electronically Signed   By: Genevive Bi M.D.   On: 10/08/2015 18:30   Current Medications:  . aspirin EC  81 mg Oral Daily  . atorvastatin  80 mg Oral Daily  . cephALEXin  500 mg Oral Q6H  . metoprolol succinate  25 mg Oral Daily  . nicotine  14  mg Transdermal Daily  . sodium chloride flush  3 mL Intravenous Q12H   . sodium chloride 10 mL/hr at 10/10/15 0500  . heparin 1,100 Units/hr (10/10/15 0453)    ASSESSMENT AND PLAN: 1. NSTEMI - Trops trending downward - ACS heparin, ASA, BB, statin. Got Plavix 600 mg 07/08, 300 mg on 07/09, will start 75 mg qd.  - TTE limited ordered for wall motion - Plan cath 07/10 - Cardiac catheterization was discussed with the patient fully. The patient understands that risks include but are not limited to stroke (1 in 1000), death (1 in 1000), kidney failure [usually temporary] (1 in 500), bleeding (1 in 200), allergic reaction [possibly serious] (1 in 200). The patient understands and is willing to proceed.   2. ASCAD with recent cath with 90% prox LAD with possible dissection and akinetic anterior wall - medical management as it appeared to be a completed anterolateral infarct. She was not on ASA due toNOAC.   3. History of LV thrombus - Currently on ACS heparin, will need counseling regarding whether she needs chronic anticoagulation or not-- she was told this could stop after 1 month. There is no data for LV thrombus and NOACs but given concern for compliance in followup with warfarin she was placed on Eliquis. 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 Repeat 2D echo pending.   4. Ischemic cardiomyopathy EF 35-40% - Noncompliance with meds - stopped after 1 month - Restarted metoprolol, holding off on ACE for now and will restart after cath - Not on chronic diuretic. Weights down trending, BNP 106.   5. RUQ epigastric pain - negative RUQ ultrasound, LFTS stable/normal - ?2nd hepatic steatosis  7. Nausea/vommiting possible UTI - As above could be inferior ischemia +/- gastritis +/- UTI.  - could also be S.E. Of Keflex since it improved initially but has returned. - will add Protonix to make sure GERD not a problem - s/p 1L NS in ED - Anti nausea medications  ordered prn - UA slightly dirty. Started on IV ceftriaxone 07/08. Change to Keflex 500mg  q6 hours for 5 days, Cx w/ >100,000 colonies E. Coli, sensitive to everything except ampicillin. MD advise if med changes needed. - will add prn fentanyl (feel morphine might increase nausea and do not want to start po meds).  8. Tobacco abuse - 1 ppd, ordered patch  9. Hypokalemia - repleted w/ 40 meq, needs more, will give another 40 meq if GI issues will tolerate, change IVF to NS w/ KCL  Otherwise, continue current care Principal Problem:   NSTEMI (non-ST elevated myocardial infarction) [facility]) Active Problems:   Old anterior myocardial infarction   Tobacco abuse   Epigastric pain   LV (left ventricular) mural thrombus (  HCC)   Cardiomyopathy, ischemic   Coronary atherosclerosis of native coronary artery   Signed, Leanna Battles 9:46 AM 10/10/2015 The patient has been seen in conjunction with Theodore Demark, PA-C. All aspects of care have been considered and discussed. The patient has been personally interviewed, examined, and all clinical data has been reviewed.   Prior cath report was reviewed.  This presentation is predominantly nausea and vomiting. There is no associated pressure/tightness in the subxiphoid region as on her earlier presentation. ECGs have not shown ischemic changes. A flat cardiac marker pattern is noted.  Cath is been scheduled for later today. The patient was counseled to undergo left heart catheterization, coronary angiography, and possible percutaneous coronary intervention with stent implantation. The procedural risks and benefits were discussed in detail. The risks discussed included death, stroke, myocardial infarction, life-threatening bleeding, limb ischemia, kidney injury, allergy, and possible emergency cardiac surgery. The risk of these significant complications were estimated to occur less than 1% of the time. After discussion, the patient has agreed to  proceed.

## 2015-10-10 NOTE — Progress Notes (Signed)
Site area: LFA Site Prior to Removal:  Level 0 Pressure Applied For:60min Manual: yes   Patient Status During Pull:  stable Post Pull Site:  Level 0 Post Pull Instructions Given:   Post Pull Pulses Present:doppler  Dressing Applied:  tegaderm Bedrest begins @ 1730 till 2130 Comments:

## 2015-10-10 NOTE — Progress Notes (Signed)
ANTICOAGULATION CONSULT NOTE - Follow Up Consult  Pharmacy Consult for Heparin  Indication: History of LV thrombus, elevated troponin  No Known Allergies  Patient Measurements: Height: 5\' 5"  (165.1 cm) Weight: 151 lb 14.4 oz (68.9 kg) IBW/kg (Calculated) : 57   Vital Signs: Temp: 98.5 F (36.9 C) (07/09 2031) Temp Source: Oral (07/09 2031) BP: 107/66 mmHg (07/09 2031) Pulse Rate: 86 (07/09 2031)  Labs:  Recent Labs  10/08/15 1609  10/09/15 0019 10/09/15 0845 10/09/15 1153 10/10/15 0312 10/10/15 0313  HGB 14.8  --   --  11.7*  --   --  11.7*  HCT 43.2  --   --  35.0*  --   --  34.5*  PLT 289  --   --  233  --   --  253  LABPROT  --   --   --  14.4  --   --   --   INR  --   --   --  1.10  --   --   --   HEPARINUNFRC  --   --  0.50 0.39  --  0.26*  --   CREATININE 0.72  --   --  0.61  --   --   --   TROPONINI  --   < > 1.12* 0.90* 0.91*  --   --   < > = values in this interval not displayed.  Estimated Creatinine Clearance: 87.6 mL/min (by C-G formula based on Cr of 0.61).  Assessment: 45 y/o F with hx LV thrombus , Eliquis on hold for cath today, for heparin Goal of Therapy:  Heparin level 0.3-0.7 units/ml Monitor platelets by anticoagulation protocol: Yes   Plan:  Increase Heparin 1100 units/hr F/U after cath  Eddie Candle 10/10/2015,4:26 AM

## 2015-10-10 NOTE — H&P (View-Only) (Signed)
Patient Name: Casey [name] Date of Encounter: 10/10/2015  Principal Problem:   NSTEMI (non-ST elevated myocardial infarction) [facility]) Active Problems:   Old anterior myocardial infarction   Tobacco abuse   Epigastric pain   LV (left ventricular) mural thrombus (HCC)   Cardiomyopathy, ischemic   Coronary atherosclerosis of native coronary artery   Primary Cardiologist: Dr Jens Som  Patient Profile: 45 yo female w/ hx cath 08/2015 w/ 90% prox-mid LAD (possible dissection), 60% RI, 50% RCA, EF 35-40%, MI was felt completed OOH, med rx. She was off all rx. Admitted 07/09 w/ N&V, abd pain (same sx as 08/2015), trop elevated 1.2.  SUBJECTIVE: Denies CP or SOB, has been nauseated this am. abd pain present on admit had resolved, but came back this am.  OBJECTIVE Filed Vitals:   10/09/15 1012 10/09/15 1500 10/09/15 2031 10/10/15 0454  BP: 110/68 99/57 107/66 93/64  Pulse: 87 99 86 83  Temp:  98.3 F (36.8 C) 98.5 F (36.9 C) 98.3 F (36.8 C)  TempSrc:  Oral Oral Oral  Resp:  Height:      Weight:    152 lb 3.2 oz (69.037 kg)  SpO2:  100% 100% 100%    Intake/Output Summary (Last 24 hours) at 10/10/15 0946 Last data filed at 10/10/15 0700  Gross per 24 hour  Intake    360 ml  Output      0 ml  Net    360 ml   Filed Weights   10/08/15 2201 10/09/15 0549 10/10/15 0454  Weight: 151 lb 7.3 oz (68.7 kg) 151 lb 14.4 oz (68.9 kg) 152 lb 3.2 oz (69.037 kg)    PHYSICAL EXAM General: Well developed, well nourished, female in no acute distress. Head: Normocephalic, atraumatic.  Neck: Supple without bruits, JVD not elevated. Lungs:  Resp regular and unlabored, CTA. Heart: RRR, S1, S2, no S3, S4, or murmur; no rub. Abdomen: Soft, tender (worst RUQ), non-distended, BS + x 4.  Extremities: No clubbing, cyanosis, edema.  Neuro: Alert and oriented X 3. Moves all extremities spontaneously. Psych: Normal affect.  LABS: CBC: Recent Labs  10/09/15 0845  10/10/15 0313  WBC 5.9 6.4  HGB 11.7* 11.7*  HCT 35.0* 34.5*  MCV 94.9 94.3  PLT 233 253   INR: Recent Labs  10/09/15 0845  INR 1.10   Basic Metabolic Panel: Recent Labs  10/08/15 1609 10/09/15 0845  NA 135 135  K 3.2* 3.4*  CL 101 102  CO2 22 26  GLUCOSE 135* 96  BUN <5* <5*  CREATININE 0.72 0.61  CALCIUM 9.3 8.7*   Liver Function Tests: Recent Labs  10/08/15 1609  AST 66*  ALT 50  ALKPHOS 126  BILITOT 0.6  PROT 7.6  ALBUMIN 3.2*   Cardiac Enzymes: Recent Labs  10/09/15 0019 10/09/15 0845 10/09/15 1153  TROPONINI 1.12* 0.90* 0.91*    Recent Labs  10/08/15 1840  TROPIPOC 0.61*   BNP:  B NATRIURETIC PEPTIDE  Date/Time Value Ref Range Status  10/08/2015 09:33 PM 106.4* 0.0 - 100.0 pg/mL Final   TELE:   SR     Radiology/Studies: Dg Chest Port 1 View 10/08/2015  CLINICAL DATA:  Epigastric pain EXAM: PORTABLE CHEST 1 VIEW COMPARISON:  08/01/2015 FINDINGS: Exam is lordotic. Cardiac silhouette is enlarged. No effusion, infiltrate, pneumothorax. No acute osseous abnormality. IMPRESSION: Cardiomegaly without acute cardiopulmonary findings. Electronically Signed   By: Genevive Bi M.D.   On: 10/08/2015 19:19   US Abdomen Limited  Ruq 10/08/2015  CLINICAL DATA:  Abdominal pain. RIGHT upper quadrant pain for 1 week. EXAM: US ABDOMEN LIMITED - RIGHT UPPER QUADRANT COMPARISON:  None. FINDINGS: Gallbladder: No gallstones or wall thickening visualized. No sonographic Murphy sign noted by sonographer. Common bile duct: Diameter: Normal in diameter at 5 mm. Liver: Increased liver echogenicity.  No duct dilatation. IMPRESSION: 1. Normal gallbladder. 2. Increased liver echogenicity commonly represents hepatic steatosis. Electronically Signed   By: Genevive Bi M.D.   On: 10/08/2015 18:30   Current Medications:  . aspirin EC  81 mg Oral Daily  . atorvastatin  80 mg Oral Daily  . cephALEXin  500 mg Oral Q6H  . metoprolol succinate  25 mg Oral Daily  . nicotine  14  mg Transdermal Daily  . sodium chloride flush  3 mL Intravenous Q12H   . sodium chloride 10 mL/hr at 10/10/15 0500  . heparin 1,100 Units/hr (10/10/15 0453)    ASSESSMENT AND PLAN: 1. NSTEMI - Trops trending downward - ACS heparin, ASA, BB, statin. Got Plavix 600 mg 07/08, 300 mg on 07/09, will start 75 mg qd.  - TTE limited ordered for wall motion - Plan cath 07/10 - Cardiac catheterization was discussed with the patient fully. The patient understands that risks include but are not limited to stroke (1 in 1000), death (1 in 1000), kidney failure [usually temporary] (1 in 500), bleeding (1 in 200), allergic reaction [possibly serious] (1 in 200). The patient understands and is willing to proceed.   2. ASCAD with recent cath with 90% prox LAD with possible dissection and akinetic anterior wall - medical management as it appeared to be a completed anterolateral infarct. She was not on ASA due toNOAC.   3. History of LV thrombus - Currently on ACS heparin, will need counseling regarding whether she needs chronic anticoagulation or not-- she was told this could stop after 1 month. There is no data for LV thrombus and NOACs but given concern for compliance in followup with warfarin she was placed on Eliquis. 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 Repeat 2D echo pending.   4. Ischemic cardiomyopathy EF 35-40% - Noncompliance with meds - stopped after 1 month - Restarted metoprolol, holding off on ACE for now and will restart after cath - Not on chronic diuretic. Weights down trending, BNP 106.   5. RUQ epigastric pain - negative RUQ ultrasound, LFTS stable/normal - ?2nd hepatic steatosis  7. Nausea/vommiting possible UTI - As above could be inferior ischemia +/- gastritis +/- UTI.  - could also be S.E. Of Keflex since it improved initially but has returned. - will add Protonix to make sure GERD not a problem - s/p 1L NS in ED - Anti nausea medications  ordered prn - UA slightly dirty. Started on IV ceftriaxone 07/08. Change to Keflex 500mg  q6 hours for 5 days, Cx w/ >100,000 colonies E. Coli, sensitive to everything except ampicillin. MD advise if med changes needed. - will add prn fentanyl (feel morphine might increase nausea and do not want to start po meds).  8. Tobacco abuse - 1 ppd, ordered patch  9. Hypokalemia - repleted w/ 40 meq, needs more, will give another 40 meq if GI issues will tolerate, change IVF to NS w/ KCL  Otherwise, continue current care Principal Problem:   NSTEMI (non-ST elevated myocardial infarction) [facility]) Active Problems:   Old anterior myocardial infarction   Tobacco abuse   Epigastric pain   LV (left ventricular) mural thrombus (  HCC)   Cardiomyopathy, ischemic   Coronary atherosclerosis of native coronary artery   Signed, Leanna Battles 9:46 AM 10/10/2015 The patient has been seen in conjunction with Theodore Demark, PA-C. All aspects of care have been considered and discussed. The patient has been personally interviewed, examined, and all clinical data has been reviewed.   Prior cath report was reviewed.  This presentation is predominantly nausea and vomiting. There is no associated pressure/tightness in the subxiphoid region as on her earlier presentation. ECGs have not shown ischemic changes. A flat cardiac marker pattern is noted.  Cath is been scheduled for later today. The patient was counseled to undergo left heart catheterization, coronary angiography, and possible percutaneous coronary intervention with stent implantation. The procedural risks and benefits were discussed in detail. The risks discussed included death, stroke, myocardial infarction, life-threatening bleeding, limb ischemia, kidney injury, allergy, and possible emergency cardiac surgery. The risk of these significant complications were estimated to occur less than 1% of the time. After discussion, the patient has agreed to  proceed.

## 2015-10-11 ENCOUNTER — Inpatient Hospital Stay (HOSPITAL_COMMUNITY): Payer: Self-pay

## 2015-10-11 ENCOUNTER — Encounter (HOSPITAL_COMMUNITY): Payer: Self-pay | Admitting: Cardiovascular Disease

## 2015-10-11 DIAGNOSIS — Z720 Tobacco use: Secondary | ICD-10-CM

## 2015-10-11 DIAGNOSIS — R9431 Abnormal electrocardiogram [ECG] [EKG]: Secondary | ICD-10-CM

## 2015-10-11 LAB — CBC
HCT: 35.5 % — ABNORMAL LOW (ref 36.0–46.0)
Hemoglobin: 11.6 g/dL — ABNORMAL LOW (ref 12.0–15.0)
MCH: 30.9 pg (ref 26.0–34.0)
MCHC: 32.7 g/dL (ref 30.0–36.0)
MCV: 94.7 fL (ref 78.0–100.0)
PLATELETS: 273 10*3/uL (ref 150–400)
RBC: 3.75 MIL/uL — ABNORMAL LOW (ref 3.87–5.11)
RDW: 16.6 % — AB (ref 11.5–15.5)
WBC: 7.7 10*3/uL (ref 4.0–10.5)

## 2015-10-11 LAB — BASIC METABOLIC PANEL
Anion gap: 8 (ref 5–15)
CALCIUM: 8.7 mg/dL — AB (ref 8.9–10.3)
CHLORIDE: 102 mmol/L (ref 101–111)
CO2: 24 mmol/L (ref 22–32)
CREATININE: 0.64 mg/dL (ref 0.44–1.00)
GFR calc non Af Amer: >60 mL/min (ref >60)
Glucose, Bld: 122 mg/dL — ABNORMAL HIGH (ref 65–99)
Potassium: 4 mmol/L (ref 3.5–5.1)
SODIUM: 134 mmol/L — AB (ref 135–145)

## 2015-10-11 LAB — ECHOCARDIOGRAM COMPLETE
HEIGHTINCHES: 65 in
WEIGHTICAEL: 2473.6 [oz_av]

## 2015-10-11 LAB — HEPARIN LEVEL (UNFRACTIONATED)
HEPARIN UNFRACTIONATED: 0.3 [IU]/mL (ref 0.30–0.70)
HEPARIN UNFRACTIONATED: 0.51 [IU]/mL (ref 0.30–0.70)

## 2015-10-11 LAB — POCT ACTIVATED CLOTTING TIME: ACTIVATED CLOTTING TIME: 92 s

## 2015-10-11 MED ORDER — CLOPIDOGREL BISULFATE 75 MG PO TABS: 75.0000 mg | ORAL_TABLET | Freq: Every day | ORAL | Status: DC

## 2015-10-11 MED ORDER — APIXABAN 5 MG PO TABS
5.0000 mg | ORAL_TABLET | Freq: Two times a day (BID) | ORAL | Status: DC
  Administered 2015-10-11 – 2015-10-14 (×6): 5 mg via ORAL
  Filled 2015-10-11 (×6): qty 1

## 2015-10-11 MED ORDER — ASPIRIN 81 MG PO CHEW: 81.0000 mg | CHEWABLE_TABLET | Freq: Every day | ORAL | Status: DC

## 2015-10-11 MED ORDER — FUROSEMIDE 10 MG/ML IJ SOLN
20.0000 mg | Freq: Once | INTRAMUSCULAR | Status: AC
  Administered 2015-10-11: 20 mg via INTRAVENOUS
  Filled 2015-10-11: qty 2

## 2015-10-11 NOTE — Progress Notes (Addendum)
Patient Name: Casey [name] Date of Encounter: 10/11/2015  Principal Problem:   NSTEMI (non-ST elevated myocardial infarction) [facility]) Active Problems:   Old anterior myocardial infarction   Tobacco abuse   Epigastric pain   LV (left ventricular) mural thrombus (HCC)   Cardiomyopathy, ischemic   Coronary atherosclerosis of native coronary artery   Primary Cardiologist: Dr Jens Som  Patient Profile: 45 yo female w/ hx cath 08/2015 w/ 90% prox-mid LAD (possible dissection), 60% RI, 50% RCA, EF 35-40%, MI was felt completed OOH, med rx. She was off all rx. Admitted 07/09 w/ N&V, abd pain (same sx as 08/2015), trop elevated 1.2. Cath 07/10 w/   SUBJECTIVE: Only complaint is upper R quadrant abd pain. This is the reason she came to the ER. No chest pain, no SOB. Says she believes she will be able to get the Eliquis filled. Says she is trying to quit smoking.   OBJECTIVE Filed Vitals:   10/10/15 2100 10/10/15 2129 10/10/15 2130 10/11/15 0608  BP: 97/65  Pulse: 85 96 87 86  Temp:   98.6 F (37 C) 98.6 F (37 C)  TempSrc:   Oral Oral  Resp:   18 18  Height:      Weight:    154 lb 9.6 oz (70.126 kg)  SpO2:   100% 100%    Intake/Output Summary (Last 24 hours) at 10/11/15 1100 Last data filed at 10/11/15 0745  Gross per 24 hour  Intake    563 ml  Output      0 ml  Net    563 ml   Filed Weights   10/09/15 0549 10/10/15 0454 10/11/15 1610  Weight: 151 lb 14.4 oz (68.9 kg) 152 lb 3.2 oz (69.037 kg) 154 lb 9.6 oz (70.126 kg)    PHYSICAL EXAM General: Well developed, well nourished, female in no acute distress. Head: Normocephalic, atraumatic.  Neck: Supple without bruits, JVD not elevated. Lungs:  Resp regular and unlabored, CTA. Heart: RRR, S1, S2, no S3, S4, or murmur; no rub. Abdomen: Soft, tender RUQ related to palpable tenderness of the costal margin. Extremities: No clubbing, cyanosis, edema. Bilateral groins used at cath, u/a to get in R side,  bilateral groins without ecchymosis or hematoma, no bruits. Neuro: Alert and oriented X 3. Moves all extremities spontaneously. Psych: Normal affect.  LABS: CBC:  Recent Labs  10/10/15 0313 10/11/15 0428  WBC 6.4 7.7  HGB 11.7* 11.6*  HCT 34.5* 35.5*  MCV 94.3 94.7  PLT 253 273   INR:  Recent Labs  10/09/15 0845  INR 1.10   Basic Metabolic Panel:  Recent Labs  96/04/54 0845 10/11/15 0428  NA 135 134*  K 3.4* 4.0  CL 102 102  CO2 26 24  GLUCOSE 96 122*  BUN <5* <5*  CREATININE 0.61 0.64  CALCIUM 8.7* 8.7*   Liver Function Tests:  Recent Labs  10/08/15 1609  AST 66*  ALT 50  ALKPHOS 126  BILITOT 0.6  PROT 7.6  ALBUMIN 3.2*   Cardiac Enzymes:  Recent Labs  10/09/15 0019 10/09/15 0845 10/09/15 1153  TROPONINI 1.12* 0.90* 0.91*    Recent Labs  10/08/15 1840  TROPIPOC 0.61*   BNP:  B NATRIURETIC PEPTIDE  Date/Time Value Ref Range Status  10/08/2015 09:33 PM 106.4* 0.0 - 100.0 pg/mL Final    TELE:   SR, ST 120s    ECHO: 07/11 Done, results pending  Cath: 07/10   There is moderate to  severe left ventricular systolic dysfunction.  Ost Ramus to Ramus lesion, 70% stenosed.  Ost LAD to Prox LAD lesion, 50% stenosed.  Prox LAD to Mid LAD lesion, 55% stenosed.  Prox Cx lesion, 20% stenosed.  Ost RCA lesion, 100% stenosed.     Moderately severe LV dysfunction with an ejection fraction of 30-35% with hypokinesis of the anterior to anterolateral wall with akinesis to dyskinesis in the distal anterior apical and apical segment with suggestion of a large, smooth bordered, probable mural thrombus apically extending to the distal anterolateral wall and focal basal inferior hypocontractility.      Previously documented dissection of the proximal LAD with improved filling and residual narrowing of 50-55%; 70% smooth eccentric proximal ramus stenosis, mild 20% left circumflex stenosis, and new total occlusion of the RCA at its origin with extensive  left to right collaterals supplying almost the entire RCA territory distally to near [place].      Recommendation: Angiograms will be reviewed with colleagues. Since her catheterization of 08/04/2015, the RCA is now occluded [facility] which she has extensively collateralized via left injection. She continues to have residual LAD proximal dissection, but there is TIMI-3 flow. Smoking cessation is essential. It appears that she has an extensive left ventricular mural thrombus. An echocardiogram was ordered. She will need to be on anticoagulation long-term. In addition, she has reduced right femoral pulse and duplex imaging of her right lower extremity is recommended.   Radiology: Dg Chest Port 1 View 10/08/2015  CLINICAL DATA:  Epigastric pain EXAM: PORTABLE CHEST 1 VIEW COMPARISON:  08/01/2015 FINDINGS: Exam is lordotic. Cardiac silhouette is enlarged. No effusion, infiltrate, pneumothorax. No acute osseous abnormality. IMPRESSION: Cardiomegaly without acute cardiopulmonary findings. Electronically Signed   By: Genevive Bi M.D.   On: 10/08/2015 19:19   US Abdomen Limited Ruq 10/08/2015  CLINICAL DATA:  Abdominal pain. RIGHT upper quadrant pain for 1 week. EXAM: US ABDOMEN LIMITED - RIGHT UPPER QUADRANT COMPARISON:  None. FINDINGS: Gallbladder: No gallstones or wall thickening visualized. No sonographic Murphy sign noted by sonographer. Common bile duct: Diameter: Normal in diameter at 5 mm. Liver: Increased liver echogenicity.  No duct dilatation. IMPRESSION: 1. Normal gallbladder. 2. Increased liver echogenicity commonly represents hepatic steatosis. Electronically Signed   By: Genevive Bi M.D.   On: 10/08/2015 18:30     Current Medications:  . aspirin EC  81 mg Oral Daily  . atorvastatin  80 mg Oral Daily  . cephALEXin  500 mg Oral Q6H  . clopidogrel  75 mg Oral Daily  . metoprolol succinate  25 mg Oral Daily  . nicotine  14 mg Transdermal Daily  . pantoprazole  40 mg Oral Q0600    . sodium chloride flush  3 mL Intravenous Q12H   . sodium chloride 60 mL/hr at 10/10/15 1637  . 0.9 % NaCl with KCl 20 mEq / L 50 mL/hr at 10/10/15 1015  . heparin 1,100 Units/hr (10/11/15 0129)    ASSESSMENT AND PLAN: 1. NSTEMI - Trops trending downward  Troponins have not shown evidence of a peak and trough. I doubt that this presentation was due to acute infarction.  Repeat angiography demonstrated recent occlusion of the native right coronary with left-to-right collaterals. She already has severe segmental stenosis/dissection in the proximal RCA. She has not had interventions performed to improve blood flow. The LAD was not treated because it was felt that the infarct was completed. The RCA was not treated because of well formed collaterals.  2. ASCAD with recent  cath with 90% prox LAD with possible dissection and akinetic anterior wall and new subacute occlusion of the native RCA as noted above.   3. History of LV thrombus  Clearly identified apical LV thrombus at both angiography and with transthoracic echo on yesterday.  She will need long-term anticoagulation therapy to prevent embolization. She is already on eloquent's is now patient and we will continue this going forward. I will discontinue Plavix as it has no current role though increased risk of bleeding.   4. Ischemic cardiomyopathy EF 35-40%  Resume an ACE inhibitor therapy. We had a long discussion concerning the importance of compliance.   5. RUQ epigastric pain  At this point I believe the right upper quadrant pain is related to costochondritis.  7. Nausea/vommiting possible UTI  She is having nausea and vomiting along with dysphagia to some of her tablets. Not certain of etiology. May need GI workup.  8. Tobacco abuse - 1 ppd, ordered patch  9. Hypokalemia - repleted w/ 40 meq 07/09, IVF NS w/ KCL completed supplement.  Otherwise, continue current care Principal Problem:   NSTEMI (non-ST elevated  myocardial infarction) (HCC) Active Problems:   Old anterior myocardial infarction   Tobacco abuse   Epigastric pain   LV (left ventricular) mural thrombus (HCC)   Cardiomyopathy, ischemic   Coronary atherosclerosis of native coronary artery   Signed, Barrett, Rhonda , PA-C 11:00 AM 10/11/2015 The patient has been seen in conjunction with Theodore Demark, PA-C. All aspects of care have been considered and discussed. The patient has been personally interviewed, examined, and all clinical data has been reviewed.   Patient needs to have a myocardial viability study performed to determine if revascularization therapy will be helpful.  She will need long-term anticoagulation therapy because of a large apical thrombus.  Heart failure therapy needs to be started and optimized to preserve remaining LV function.  Dysphagia, nausea, and vomiting may need to have GI evaluation to further define. Recent CT of the abdomen did not reveal any liver or gallbladder pathology.  She has been on both Plavix and Eliquis which be associated with a long-term increased risk of bleeding. For the time being and I will discontinue Plavix and perhaps a GI workup will help [place] to exclude the possibility of any lesions that would put her at risk for bleeding.  We had an extensive conversation concerning her underlying condition, the future poor prognosis, and the importance of medication compliance and risk factor modification. She seems to have a better understanding. Whether or not she will be able to comply in a helpful way will be determined

## 2015-10-11 NOTE — Progress Notes (Addendum)
ANTICOAGULATION CONSULT NOTE - Follow Up Consult  Pharmacy Consult for Heparin  Indication: History of LV thrombus, elevated troponin  No Known Allergies  Patient Measurements: Height: 5\' 5"  (165.1 cm) Weight: 154 lb 9.6 oz (70.126 kg) IBW/kg (Calculated) : 57   Vital Signs: Temp: 98.6 F (37 C) (07/11 0608) Temp Source: Oral (07/11 0608) BP: 97/65 mmHg (07/11 0608) Pulse Rate: 86 (07/11 0608)  Labs:  Recent Labs  10/08/15 1609  10/09/15 0019 10/09/15 0845 10/09/15 1153 10/10/15 0312 10/10/15 0313 10/11/15 0428 10/11/15 0624  HGB 14.8  --   --  11.7*  --   --  11.7* 11.6*  --   HCT 43.2  --   --  35.0*  --   --  34.5* 35.5*  --   PLT 289  --   --  233  --   --  253 273  --   LABPROT  --   --   --  14.4  --   --   --   --   --   INR  --   --   --  1.10  --   --   --   --   --   HEPARINUNFRC  --   < > 0.50 0.39  --  0.26*  --  0.30 0.51  CREATININE 0.72  --   --  0.61  --   --   --  0.64  --   TROPONINI  --   < > 1.12* 0.90* 0.91*  --   --   --   --   < > = values in this interval not displayed.  Estimated Creatinine Clearance: 88.1 mL/min (by C-G formula based on Cr of 0.64).  Assessment: 45 y/o F with hx LV thrombus , Eliquis on hold s/p cath on 7/10 (EF= 30-35%, probable mural thrombus, total occlusion of RCA. Plans for Echo -Heparin level= 0.51 and at goal  Goal of Therapy:  Heparin level 0.3-0.7 units/ml Monitor platelets by anticoagulation protocol: Yes   Plan:  -Continue heparin at 1100 units/hr -Daily heparin level and CBC -Will follow anticoagulation plans  Harland German, Pharm D 10/11/2015 9:01 AM  Addendum: change to apixiban -Coumadin preferred for LV thrombus but plans for apixiban due to concerns with compliance in follow-up labs   Plan -Apixiban 5mg  po bid (first dose tonight)  Harland German, Pharm D 10/11/2015 12:49 PM

## 2015-10-11 NOTE — Progress Notes (Signed)
At the start of shift, this patient told this RN that she would be leaving once her bedrest is up at 2130. Patient c/o of stomach pain and that she has not been able to eat anything. Paged on call MD to let them know of this situation. MD came and talked to the patient shortly thereafter. Patient apologized to this RN and told both the MD and I that she will be staying and that she needs to slow down and get things done. Will continue to monitor

## 2015-10-11 NOTE — Progress Notes (Signed)
CARDIAC REHAB PHASE I   PRE:  Rate/Rhythm: 76 SR  BP:  Sitting: 116/67        SaO2: 100 RA  MODE:  Ambulation: 450 ft   POST:  Rate/Rhythm: 92 SR  BP:  Sitting: 124/73         SaO2: 100 RA  Pt ambulated 450 ft on RA, IV, handheld assist, steady gait, tolerated well with no complaints other than ongoing10/10 abdominal pain, unchanged with ambulation. Began MI education. Reviewed risk factors, tobacco cessation (pt declined fake cigarette), MI book, anti-platelet therapy, activity restrictions, ntg, CHF booklet and zone tool, daily weights, and phase 2 cardiac rehab. Left heart healthy and low sodium diet handouts for pt to review. Pt verbalized understanding, flat affect. Pt agrees to phase 2 cardiac rehab referral, will send to [facility] per pt request. Will follow up tomorrow to discuss exercise and diet, reinforce education. Pt to bed per pt request after walk, call bell within reach.     [phone] Joylene Grapes, RN, BSN 10/11/2015 11:40 AM

## 2015-10-11 NOTE — Progress Notes (Signed)
Echocardiogram 2D Echocardiogram has been performed.  Casey [name] 10/11/2015, 9:38 AM

## 2015-10-12 ENCOUNTER — Inpatient Hospital Stay (HOSPITAL_COMMUNITY): Payer: Self-pay

## 2015-10-12 DIAGNOSIS — I509 Heart failure, unspecified: Secondary | ICD-10-CM

## 2015-10-12 LAB — BASIC METABOLIC PANEL
Anion gap: 5 (ref 5–15)
BUN: <5 mg/dL — ABNORMAL LOW (ref 6–20)
CO2: 26 mmol/L (ref 22–32)
Calcium: 8.2 mg/dL — ABNORMAL LOW (ref 8.9–10.3)
Chloride: 105 mmol/L (ref 101–111)
Creatinine, Ser: 0.58 mg/dL (ref 0.44–1.00)
GFR calc Af Amer: >60 mL/min (ref >60)
GFR calc non Af Amer: >60 mL/min (ref >60)
Glucose, Bld: 118 mg/dL — ABNORMAL HIGH (ref 65–99)
Potassium: 3.3 mmol/L — ABNORMAL LOW (ref 3.5–5.1)
Sodium: 136 mmol/L (ref 135–145)

## 2015-10-12 LAB — CBC
HCT: 31.7 % — ABNORMAL LOW (ref 36.0–46.0)
HEMOGLOBIN: 10.5 g/dL — AB (ref 12.0–15.0)
MCH: 31.3 pg (ref 26.0–34.0)
MCHC: 33.1 g/dL (ref 30.0–36.0)
MCV: 94.6 fL (ref 78.0–100.0)
Platelets: 284 10*3/uL (ref 150–400)
RBC: 3.35 MIL/uL — ABNORMAL LOW (ref 3.87–5.11)
RDW: 16.7 % — ABNORMAL HIGH (ref 11.5–15.5)
WBC: 7.4 10*3/uL (ref 4.0–10.5)

## 2015-10-12 MED ORDER — POTASSIUM CHLORIDE CRYS ER 20 MEQ PO TBCR
20.0000 meq | EXTENDED_RELEASE_TABLET | Freq: Two times a day (BID) | ORAL | Status: AC
  Administered 2015-10-12 (×2): 20 meq via ORAL
  Filled 2015-10-12 (×2): qty 1

## 2015-10-12 MED ORDER — LISINOPRIL 2.5 MG PO TABS
2.5000 mg | ORAL_TABLET | Freq: Every day | ORAL | Status: DC
  Administered 2015-10-12 – 2015-10-14 (×3): 2.5 mg via ORAL
  Filled 2015-10-12 (×3): qty 1

## 2015-10-12 MED ORDER — GADOBENATE DIMEGLUMINE 529 MG/ML IV SOLN
25.0000 mL | Freq: Once | INTRAVENOUS | Status: AC | PRN
  Administered 2015-10-12: 25 mL via INTRAVENOUS

## 2015-10-12 MED ORDER — METOPROLOL SUCCINATE ER 25 MG PO TB24
12.5000 mg | ORAL_TABLET | Freq: Every day | ORAL | Status: DC
  Administered 2015-10-12 – 2015-10-13 (×2): 12.5 mg via ORAL
  Filled 2015-10-12 (×2): qty 1

## 2015-10-12 NOTE — Care Management Note (Signed)
Case Management Note Donn Pierini RN, BSN Unit 2W-Case Manager [phone]  Patient Details  Name: Casey [name] MRN: [id number] Date of Birth: 05/02/1970  Subjective/Objective:   Pt admitted with NSTEMI and +thrombus                 Action/Plan: PTA pt lived at home- independent- referral received for Eliquis- per conversation with pt she was discharged on last admission with Eliquis 30 day free card and used that to fill her prescription she has not filled out any further paperwork for further assistance with the drug company and will need to do so as she is uninsured and will not be able to afford the drug without further assistance- she will not be able to use another 30 day free card- pt assistance form given to pt to fill out- MD will need to fill out prescribers part- pt will need samples for discharge in order to bridge her until approved for assistance. She was seen at [facility] in f/u - will call to see if another appointment can be scheduled. CM to f/u prior to discharge.   Expected Discharge Date:                  Expected Discharge Plan:  Home/Self Care  In-House Referral:     Discharge planning Services  CM Consult, Medication Assistance, Indigent Health Clinic  Post Acute Care Choice:    Choice offered to:     DME Arranged:    DME Agency:     HH Arranged:    HH Agency:     Status of Service:  In process, will continue to follow  If discussed at Long Length of Stay Meetings, dates discussed:    Additional Comments:  Darrold Span, RN 10/12/2015, 4:35 PM

## 2015-10-12 NOTE — Plan of Care (Signed)
Problem: Pain Managment: Goal: General experience of comfort will improve Outcome: Progressing Patient denies pain at this time   

## 2015-10-12 NOTE — Progress Notes (Addendum)
       Patient Name: [name] Date of Encounter: 10/12/2015    SUBJECTIVE: Persona of depression. Very little conversation. No difficulty overnight. Denies chest pain. Not complaining of right upper quadrant discomfort as on previous mornings.  TELEMETRY:  Normal sinus rhythm Filed Vitals:   10/11/15 2107 10/12/15 0424 10/12/15 0543 10/12/15 1022  BP: 104/60 95/56  108/64  Pulse: 78 80  82  Temp: 98.2 F (36.8 C) 98.2 F (36.8 C)    TempSrc: Oral Oral    Resp: 18 18    Height:      Weight:   156 lb 12.8 oz (71.124 kg)   SpO2: 100% 100%      Intake/Output Summary (Last 24 hours) at 10/12/15 1129 Last data filed at 10/12/15 0800  Gross per 24 hour  Intake    720 ml  Output      0 ml  Net    720 ml   LABS: Basic Metabolic Panel:  Recent Labs  62/13/08 0428 10/12/15 0305  NA 134* 136  K 4.0 3.3*  CL 102 105  CO2 24 26  GLUCOSE 122* 118*  BUN <5* <5*  CREATININE 0.64 0.58  CALCIUM 8.7* 8.2*   CBC:  Recent Labs  10/11/15 0428 10/12/15 0305  WBC 7.7 7.4  HGB 11.6* 10.5*  HCT 35.5* 31.7*  MCV 94.7 94.6  PLT 273 284   Cardiac Enzymes:  Recent Labs  10/09/15 1153  TROPONINI 0.91*     Radiology/Studies:  Cardiac MRI for viability is pending  Physical Exam: Blood pressure 108/64, pulse 82, temperature 98.2 F (36.8 C), temperature source Oral, resp. rate 18, height 5\' 5"  (1.651 m), weight 156 lb 12.8 oz (71.124 kg), SpO2 100 %. Weight change: 2 lb 3.2 oz (0.998 kg)  Wt Readings from Last 3 Encounters:  10/12/15 156 lb 12.8 oz (71.124 kg)  08/10/15 163 lb 3.2 oz (74.027 kg)  08/08/15 162 lb (73.483 kg)   Blood pressures have been soft Neck veins are flat No rub or gallop is heard No peripheral edema Lungs are clear  ASSESSMENT:  1. Unfortunate 45 year old female with LAD dissection resulting in massive anterior myocardial infarction in May. Readmitted on this occasion and has new total occlusion of the right coronary which is  collateralized. No current ischemic symptoms. 2. Large mobile apical thrombus without evidence of embolization clinically. 3. Ischemic cardiomyopathy with EF 30-35% but no overt heart failure symptoms. 4. Relatively low blood pressure 5. Psychosocial problems that produce a barrier to optimal care  Plan:  1. MRI viability study today. If significant viability noted in the inferior wall plus minus anterior wall, consider revascularization with either LAD stent and RCA CTO versus CABG. 2. Reinstituting heart failure therapy with lisinopril and metoprolol low dose to accommodate blood pressure. 3. Eliquis and aspirin 4. Depending upon clinical response to medical regimen, may be ready for discharge within the next 24-48 hours. 5. Under the circumstances, a life Vest may need consideration. Noting the difficulty we have had concerning medication compliance, this may not be prudent.  Signed, Lyn Records III 10/12/2015, 11:29 AM

## 2015-10-12 NOTE — Progress Notes (Signed)
CARDIAC REHAB PHASE I   PRE:  Rate/Rhythm: 76 SR  BP:  Sitting: 104/59        SaO2: 100 RA  MODE:  Ambulation: 550 ft   POST:  Rate/Rhythm: 91 SR  BP:  Sitting: 132/63         SaO2: 100 RA  Pt ambulated 550 ft on RA, IV, assist x1, steady gait, tolerated well with no complaints. Reviewed yesterday's education as well as exercise guidelines, heart healthy diet and sodium restrictions, pt overwhelmed, but asked good questions, engaged in conversation. Encouraged additional ambulation as tolerated. Pt to bed per pt request after walk, call bell within reach. Will follow.   [phone] Joylene Grapes, RN, BSN 10/12/2015 3:09 PM

## 2015-10-13 ENCOUNTER — Other Ambulatory Visit: Payer: Self-pay | Admitting: *Deleted

## 2015-10-13 ENCOUNTER — Telehealth: Payer: Self-pay

## 2015-10-13 ENCOUNTER — Other Ambulatory Visit (HOSPITAL_COMMUNITY): Payer: Self-pay | Admitting: Respiratory Therapy

## 2015-10-13 DIAGNOSIS — I255 Ischemic cardiomyopathy: Secondary | ICD-10-CM

## 2015-10-13 DIAGNOSIS — I25110 Atherosclerotic heart disease of native coronary artery with unstable angina pectoris: Secondary | ICD-10-CM

## 2015-10-13 DIAGNOSIS — I2510 Atherosclerotic heart disease of native coronary artery without angina pectoris: Secondary | ICD-10-CM

## 2015-10-13 DIAGNOSIS — I214 Non-ST elevation (NSTEMI) myocardial infarction: Secondary | ICD-10-CM

## 2015-10-13 LAB — URINALYSIS, ROUTINE W REFLEX MICROSCOPIC
Bilirubin Urine: NEGATIVE
Glucose, UA: NEGATIVE mg/dL
Hgb urine dipstick: NEGATIVE
Ketones, ur: NEGATIVE mg/dL
NITRITE: NEGATIVE
Protein, ur: NEGATIVE mg/dL
SPECIFIC GRAVITY, URINE: 1.017 (ref 1.005–1.030)
pH: 6.5 (ref 5.0–8.0)

## 2015-10-13 LAB — BASIC METABOLIC PANEL
Anion gap: 3 — ABNORMAL LOW (ref 5–15)
CHLORIDE: 109 mmol/L (ref 101–111)
CO2: 25 mmol/L (ref 22–32)
Calcium: 8.3 mg/dL — ABNORMAL LOW (ref 8.9–10.3)
Creatinine, Ser: 0.51 mg/dL (ref 0.44–1.00)
Glucose, Bld: 96 mg/dL (ref 65–99)
POTASSIUM: 4.4 mmol/L (ref 3.5–5.1)
SODIUM: 137 mmol/L (ref 135–145)

## 2015-10-13 LAB — URINE MICROSCOPIC-ADD ON: RBC / HPF: NONE SEEN RBC/hpf (ref 0–5)

## 2015-10-13 MED ORDER — METOPROLOL SUCCINATE ER 25 MG PO TB24
25.0000 mg | ORAL_TABLET | Freq: Every day | ORAL | Status: DC
  Administered 2015-10-14: 25 mg via ORAL
  Filled 2015-10-13: qty 1

## 2015-10-13 NOTE — Progress Notes (Addendum)
Patient Name: Casey [name] Date of Encounter: 10/13/2015    SUBJECTIVE: Denies angina. Seems angry/depressed. She is tearful when I discuss her cardiac situation. She asks [name] questions. She wants [place] to help her. She will consider any options that we feel or necessary to improve potential survivability.  TELEMETRY:  Normal sinus rhythm Filed Vitals:   10/12/15 1505 10/12/15 2050 10/13/15 0426 10/13/15 0427  BP: 132/63 119/69 107/58   Pulse: 91 78 75   Temp:  98.4 F (36.9 C) 98.5 F (36.9 C)   TempSrc:  Oral Oral   Resp:  18    Height:      Weight:    164 lb 7.4 oz (74.6 kg)  SpO2: 100% 100% 100%     Intake/Output Summary (Last 24 hours) at 10/13/15 1030 Last data filed at 10/13/15 0900  Gross per 24 hour  Intake    240 ml  Output      0 ml  Net    240 ml   LABS: Basic Metabolic Panel:  Recent Labs  61/60/73 0305 10/13/15 0506  NA 136 137  K 3.3* 4.4  CL 105 109  CO2 26 25  GLUCOSE 118* 96  BUN <5* <5*  CREATININE 0.58 0.51  CALCIUM 8.2* 8.3*   CBC:  Recent Labs  10/11/15 0428 10/12/15 0305  WBC 7.7 7.4  HGB 11.6* 10.5*  HCT 35.5* 31.7*  MCV 94.7 94.6  PLT 273 284   Cardiac Enzymes: [name] results for input(s): CKTOTAL, CKMB, CKMBINDEX, TROPONINI in the last 72 hours. BNP: Invalid input(s): POCBNP Hemoglobin A1C: [name] results for input(s): HGBA1C in the last 72 hours. Fasting Lipid Panel: [name] results for input(s): CHOL, HDL, LDLCALC, TRIG, CHOLHDL, LDLDIRECT in the last 72 hours.  Radiology/Studies:  Myocardial MRI viability study: Date 10/12/15 IMPRESSION: 1. Moderately dilated left ventricle with normal wall thickness and moderately to severely impaired systolic function (LVEF = 35%).  There is hypokinesis in the basal inferior and akinesis in the mid anterior, anteroseptal and apical anterior, septal and inferior walls.  There is a large partially mobile thrombus in the LV apex attached to the apical anterior wall measuring 42 x  35 x 32 mm.  Out of 7 myocardial segments affected by infarction there is preserved viability in the basal inferior and mid anterior walls with good prognosis if revascularized and fair prognosis in the apical inferior wall. There is a transmural infarct in the mid anteroseptal, apical septal, anterior walls and in the true apex with poor prognosis if revascularized.  2. Normal right ventricular size, thickness and systolic function (RVEF = 56%) with [name] regional wall motion abnormalities.  3. Mild left atrial dilatation.  4. Normal size of the aortic root, ascending aorta and pulmonary artery.  5. Mild mitral and trivial tricuspid regurgitation.  Tobias Alexander  Coronary angiogram 10/10/2015             My interpretation of angio is as follows:  Proximal to mid LAD dissection with up to 80% obstruction and with remaining threatened diagonal #1. Diagonal territory is moderately viable. Mid to distal LAD territory is infarcted based upon viability study.   New total occlusion of the RCA occurring within the past 8 weeks. Inferior wall is viable by viability study.  New total occlusion of the ramus intermedius which is collateralized and territory is without evidence of infarction on the viability study.  Mild to moderate circumflex disease with [name] evidence of infarction on viability  study.. Circumflex does not appear to contain ischemia producing hemodynamically significant lesions.   Physical Exam: Blood pressure 107/58, pulse 75, temperature 98.5 F (36.9 C), temperature source Oral, resp. rate 18, height  (1.651 m), weight 164 lb 7.4 oz (74.6 kg), SpO2 100 %. Weight change: 7 lb 10.6 oz (3.476 kg)  Wt Readings from Last 3 Encounters:  10/13/15 164 lb 7.4 oz (74.6 kg)  08/10/15 163 lb 3.2 oz (74.027 kg)  08/08/15 162 lb (73.483 kg)   S4 gallop is audible Neurological exam is normal [name] JVD Lungs are clear  ASSESSMENT:  1. 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 2. Rapid progression of other coronary disease with intercurrent total occlusion of the right coronary artery and ramus intermedius. 3. Left ventricular systolic dysfunction with EF of 35% and evidence of viability in the mid anterior wall, and inferior wall. [name] evidence of infarction in the high lateral wall with the circumflex is totally occluded. [name] evidence of infarction and circumflex territory. [name] hemodynamically significant lesions are noted in the circumflex territory proper. 4. Large mobile left ventricular apical thrombus. Anticoagulation therapy has been initiated since May 2017.  Plan:  1. Anticoagulation therapy along with single agent antiplatelet therapy (aspirin). 2. Beta blocker and ACE inhibitor therapy for heart failure titrated as tolerated. 3. Have discussed this patient with interventional colleagues concerning an interventional approach such as CT oh intervention on the RCA and attempted stenting of the LAD and ramus intermedius. This is possible although would be of uncertain outcome because of evidence of dissection in the LAD.     Therefore will also ask for a surgical consultation to contemplate multivessel revascularization within the next one to 3 months as we allow the apical thrombus to organize. 4. Smoking cessation   Signed, Lyn Records III 10/13/2015, 10:30 AM

## 2015-10-13 NOTE — Progress Notes (Signed)
CARDIAC REHAB PHASE I   PRE:  Rate/Rhythm: 83 SR  BP:  Sitting: 117/62        SaO2: 100 RA  MODE:  Ambulation: 790 ft   POST:  Rate/Rhythm: 92 SR  BP:  Sitting: 139/69         SaO2: 100 RA  Pt tearful with news of probable upcoming surgery, emotional support given to pt. Pt ambulated 790 ft on RA, IV, assist x1, steady gait, tolerated well with no complaints. Encouraged ambulation as tolerated. Pt to bed per pt request after walk, call bell within reach. Will follow.   [phone] Joylene Grapes, RN, BSN 10/13/2015 2:38 PM

## 2015-10-13 NOTE — Telephone Encounter (Signed)
Message received from Donn Pierini, RN CM requesting a follow up appointment  for the patient.  She had recently been seen at the [facility]. An appointment has been scheduled for 10/18/15 @ 1445 and the information has been placed on the AVS.  Update provided to K. Zenda Alpers, RN CM

## 2015-10-13 NOTE — Consult Note (Signed)
[address]       [place] [address]             [phone]          CARDIOTHORACIC SURGERY CONSULTATION REPORT  Referring Provider is Lyn Records, MD Primary Cardiologist is Lewayne Bunting, MD PCP is No PCP Per Patient - patient has seen Rene Paci in the office but is not listed on the patient's care team  Reason for consultation:  Coronary artery disease s/p acute non-ST segment elevation myocardial infarction  HPI:  Patient is a 45 year old obese African-American female with long-standing history of tobacco abuse and recently diagnosed with severe multivessel coronary artery disease with ischemic cardiomyopathy who has been referred for surgical consultation to discuss treatment options.  The patient originally presented 08/01/2015 with primary complaint of epigastric abdominal pain associated with nausea and vomiting. CT scan of the abdomen and pelvis performed in the emergency department revealed left ventricular aneurysm with mural thrombus.  Liver function tests were abnormal with mildly elevated serum transaminase. She was admitted to the hospital and evaluated in consultation by the cardiology team. Transthoracic echocardiogram revealed diffuse global left ventricular systolic dysfunction with ejection fraction estimated 35-40%. There was a large mural thrombus involving the distal anterior wall and apex of the left ventricle.  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 There was 60% stenosis of the ramus intermediate branch, if used disease in the proximal right coronary artery with 50% stenosis of the distal right coronary artery and 40% proximal stenosis of the posterior descending coronary artery. Medical therapy was recommended and the patient was started on  Eliquis for anticoagulation.  The patient was seen in follow-up by Dr. Jens Som at [facility] and Dr. Felicity Coyer in the Internal Medicine clinic shortly after hospital discharge at which time she was clinically stable and symptom free. Refills for her medical prescriptions were made available. Despite this the patient stopped taking all medications when she ran out of her 30 day supply in early June.  She was readmitted to the hospital 10/08/2015 with symptoms of nausea vomiting and abdominal discomfort that the patient states were similar to that which she experienced in May except somewhat more severe. Her pain was more localized to the right upper quadrant.  She never had any associated chest pain, chest tightness, or shortness of breath. At the time of readmission ECG revealed findings consistent with inferior wall ischemia and cardiac enzymes were elevated. She ruled in for an acute non-ST segment myocardial infarction with Troponin I peaking at 1.12.  The patient underwent repeat diagnostic cardiac catheterization revealing moderately severe left ventricular systolic dysfunction with ejection fraction estimated 30-35%. There was hypokinesis of the anterior and anterolateral wall with akinesis or dyskinesis of the distal anteroapical segment and a large mural thrombus. Previously documented dissection of the left anterior descending coronary artery appeared somewhat improved although there remained residual 50-55% stenosis at least. There is 70% proximal stenosis of the ramus intermediate branch and new total occlusion of the proximal right coronary artery with extensive left-to-right collateral filling to the distal right coronary artery from the left coronary system.   Follow-up echocardiogram revealed severe left ventricular systolic dysfunction with ejection fraction estimated 30-35%. There was dyskinesis of the apical anteroseptal, lateral, and apical myocardium and a large left ventricular mural  thrombus. There was mild  mitral regurgitation.  The patient subsequently underwent cardiac MRI viability study. This revealed moderate dilated left ventricle with ejection fraction calculated 35%. There was hypokinesis in the basal inferior wall and akinesis in the mid anterior anteroseptal and Apex. There is a large, partially mobile left ventricular mural thrombus in the left ventricular Apex attached to the apical anterior wall. There appeared to be preserved viability in the basal inferior and mid anterior walls with transmural infarct in the mid anteroseptal, apical septal, distal anterior wall and true apex. Right ventricular size and function appeared normal. There was mild mitral regurgitation. Cardiothoracic surgical consultation was requested.  The patient is single and lives locally in [place] with her daughter and several other family members. She has a total 4 children. She is not very communicative and seems disinterested in discussing her medical problems or her home life, although she has been willing to listen to my explanations of her current medical problems. She works intermittently in [employer]. She states that this requires primarily using her hands but she is not interested in describing her job more specifically.  She does not exercise on a regular basis. She has a long-standing history of tobacco abuse and states that she currently smokes one half pack cigarettes daily. She states that she quit taking all of her medications in early June because she was told that she only needed them for 30 days. She denies any history of exertional chest pain or chest tightness. She does admit to frequent epigastric discomfort and belching which seems to be unrelated to meals. She states that the right upper quadrant pain, nausea, and vomiting that she experienced prior to this admission with similar to that which she experienced an early May although more severe. Since hospital admission she has  not had any symptoms of chest or abdominal pain and she states that she currently feels fairly well. She has been eating and her bowel function is normal. She has no difficulty swallowing. She denies any history of exertional shortness of breath. She denies any history of resting shortness of breath, PND, orthopnea, or lower extremity edema. She reports a history of chronic dry nonproductive cough. She reports a long-standing history of right calf claudication.  Past Medical History  Diagnosis Date  . CAD (coronary artery disease)   . Ischemic cardiomyopathy     Past Surgical History  Procedure Laterality Date  . Cesarean section    . Cardiac catheterization N/A 08/04/2015    Procedure: Coronary/Graft Angiography;  Surgeon: Peter M Swaziland, MD;  Location: [facility] INVASIVE CV LAB;  Service: Cardiovascular;  Laterality: N/A;  . Cardiac catheterization N/A 10/10/2015    Procedure: Left Heart Cath and Coronary Angiography;  Surgeon: Lennette Bihari, MD;  Location: [facility] INVASIVE CV LAB;  Service: Cardiovascular;  Laterality: N/A;    Family History  Problem Relation Age of Onset  . Diabetes Mellitus II Neg Hx   . CAD Neg Hx     Social History   Social History  . Marital Status: Single    Spouse Name: N/A  . Number of Children: N/A  . Years of Education: N/A   Occupational History  . Not on file.   Social History Main Topics  . Smoking status: Current Every Day Smoker -- 0.50 packs/day    Types: Cigarettes  . Smokeless tobacco: Never Used  . Alcohol Use: 0.0 oz/week    0 Standard drinks or equivalent per week     Comment: Two drinks a night.  Marland Kitchen  Drug Use: No     Comment: No history of cocaine use  . Sexual Activity: Not on file   Other Topics Concern  . Not on file   Social History Narrative    Prior to Admission medications   Medication Sig Start Date End Date Taking? Authorizing Provider  apixaban (ELIQUIS) 5 MG TABS tablet Take 1 tablet (5 mg total) by mouth 2 (two) times daily.  08/10/15   Newt Lukes, MD  atorvastatin (LIPITOR) 80 MG tablet Take 1 tablet (80 mg total) by mouth daily. 08/10/15   Newt Lukes, MD  lisinopril (PRINIVIL,ZESTRIL) 5 MG tablet Take 1 tablet (5 mg total) by mouth daily. 08/10/15   Newt Lukes, MD  metoprolol succinate (TOPROL-XL) 25 MG 24 hr tablet Take 1 tablet (25 mg total) by mouth daily. 08/10/15   Newt Lukes, MD    Current Facility-Administered Medications  Medication Dose Route Frequency Provider Last Rate Last Dose  . 0.9 %  sodium chloride infusion  250 mL Intravenous PRN Lennette Bihari, MD      . 0.9 % NaCl with KCl 20 mEq/ L  infusion   Intravenous Continuous Lyn Records, MD 100 mL/hr at 10/13/15 0426    . acetaminophen (TYLENOL) tablet 650 mg  650 mg Oral Q4H PRN Lennette Bihari, MD      . apixaban Everlene Balls) tablet 5 mg  5 mg Oral BID Silvana Newness, RPH   5 mg at 10/13/15 1041  . aspirin EC tablet 81 mg  81 mg Oral Daily Lennette Bihari, MD   81 mg at 10/13/15 1041  . atorvastatin (LIPITOR) tablet 80 mg  80 mg Oral Daily Greig Castilla Means, MD   80 mg at 10/13/15 1041  . cephALEXin (KEFLEX) capsule 500 mg  500 mg Oral Q6H Quintella Reichert, MD   500 mg at 10/13/15 1234  . diazepam (VALIUM) tablet 5 mg  5 mg Oral Q4H PRN Lennette Bihari, MD      . fentaNYL (SUBLIMAZE) injection 12.5-25 mcg  12.5-25 mcg Intravenous Q4H PRN Joline Salt Barrett, PA-C      . lisinopril (PRINIVIL,ZESTRIL) tablet 2.5 mg  2.5 mg Oral Daily Lyn Records, MD   2.5 mg at 10/13/15 1040  . [START ON 10/14/2015] metoprolol succinate (TOPROL-XL) 24 hr tablet 25 mg  25 mg Oral Daily Lyn Records, MD      . nicotine (NICODERM CQ - dosed in mg/24 hours) patch 14 mg  14 mg Transdermal Daily Greig Castilla Means, MD   14 mg at 10/08/15 2355  . nitroGLYCERIN (NITROSTAT) SL tablet 0.4 mg  0.4 mg Sublingual Q5 Min x 3 PRN Greig Castilla Means, MD      . ondansetron [facility]) injection 4 mg  4 mg Intravenous Q6H PRN Greig Castilla Means, MD   4 mg at  10/09/15 2334  . ondansetron (ZOFRAN) injection 4 mg  4 mg Intravenous Q6H PRN Lennette Bihari, MD      . pantoprazole (PROTONIX) EC tablet 40 mg  40 mg Oral Q0600 Rhonda G Barrett, PA-C   40 mg at 10/13/15 0514  . sodium chloride flush (NS) 0.9 % injection 3 mL  3 mL Intravenous Q12H Lennette Bihari, MD   3 mL at 10/12/15 2131  . sodium chloride flush (NS) 0.9 % injection 3 mL  3 mL Intravenous PRN Lennette Bihari, MD        No Known Allergies  Review of Systems:   General:  normal appetite, normal energy, no weight gain, no weight loss, no fever  Cardiac:  no chest pain with exertion, no chest pain at rest, no SOB with exertion, no resting SOB, no PND, no orthopnea, no palpitations, no arrhythmia, no atrial fibrillation, no LE edema, no dizzy spells, no syncope  Respiratory:  no shortness of breath, no home oxygen, no productive cough, + chronic dry cough, no bronchitis, no wheezing, no hemoptysis, no asthma, no pain with inspiration or cough, no sleep apnea, no CPAP at night  GI:   no difficulty swallowing, + reflux, + frequent heartburn, no hiatal hernia, + abdominal pain, no constipation, no diarrhea, no hematochezia, no hematemesis, no melena  GU:   no dysuria,  no frequency, no urinary tract infection, no hematuria, no kidney stones, no kidney disease  Vascular:  + pain suggestive of right calf claudication, no pain in feet, no leg cramps, no varicose veins, no DVT, no non-healing foot ulcer  Neuro:   no stroke, no TIA's, no seizures, no headaches, no temporary blindness one eye,  no slurred speech, no peripheral neuropathy, no chronic pain, no instability of gait, no memory/cognitive dysfunction  Musculoskeletal: no arthritis, no joint swelling, no myalgias, no difficulty walking, normal mobility   Skin:   no rash, no itching, no skin infections, no pressure sores or ulcerations  Psych:   no anxiety, no depression, no nervousness, no unusual recent stress  Eyes:   no blurry vision, no  floaters, no recent vision changes, does not wear glasses or contacts  ENT:   no hearing loss, no loose or painful teeth, no dentures, last saw dentist many years ago  Hematologic:  no easy bruising, no abnormal bleeding, no clotting disorder, no frequent epistaxis  Endocrine:  no diabetes, does not check CBG's at home     Physical Exam:   BP 105/62 mmHg  Pulse 82  Temp(Src) 98.5 F (36.9 C) (Oral)  Resp 18  Ht 5\' 5"  (1.651 m)  Wt 164 lb 7.4 oz (74.6 kg)  BMI 27.37 kg/m2  SpO2 100%  LMP  (LMP Unknown)  General:  Mild to moderately obese, NAD - depressed affect  HEENT:  Unremarkable   Neck:   no JVD, no bruits, no adenopathy   Chest:   clear to auscultation, symmetrical breath sounds, no wheezes, no rhonchi   CV:   RRR, no murmur   Abdomen:  soft, non-tender, no masses   Extremities:  warm, well-perfused, pulses diminished, no lower extremity edema  Rectal/GU  Deferred  Neuro:   Grossly non-focal and symmetrical throughout  Skin:   Clean and dry, no rashes, no breakdown  Diagnostic Tests:  Transthoracic Echocardiography  Patient: Casey [name], Casey [name] MR #: [id number] Study Date: 08/02/2015 Gender: F Age: 65 Height: 162.6 cm Weight: 73.9 kg BSA: 1.85 m^2 Pt. Status: Room: 3E10C  ADMITTING Doristine Devoid REFERRING Eduard Clos PERFORMING [place], Inpatient ATTENDING Palumbo, April SONOGRAPHER Lysbeth Galas, RDCS  cc:  ------------------------------------------------------------------- LV EF: 35% - 40%  ------------------------------------------------------------------- Indications: 53 MI, old.  ------------------------------------------------------------------- History: PMH: No prior cardiac history.  ------------------------------------------------------------------- Study Conclusions  - Left ventricle: The cavity size was mildly  dilated. Systolic  function was moderately reduced. The estimated ejection fraction  was in the range of 35% to 40%. Mild diffuse hypokinesis with  akinesis of the mid-apical anterior wall, mid-apical anteroseptum  and apex. Features are consistent with a pseudonormal left  ventricular filling pattern,  with concomitant abnormal relaxation  and increased filling pressure (grade 2 diastolic dysfunction).  There was a thrombus. There was a 2.8 cm (L) x 1.4 cm (W),  apicalthrombus. - Aortic valve: Transvalvular velocity was within the normal range.  There was no stenosis. There was no regurgitation. - Mitral valve: Transvalvular velocity was within the normal range.  There was no evidence for stenosis. There was mild regurgitation. - Left atrium: The atrium was mildly dilated. - Right ventricle: The cavity size was normal. Wall thickness was  normal. Systolic function was mildly reduced. - Tricuspid valve: There was mild regurgitation. - Pulmonary arteries: Systolic pressure was within the normal  range. PA peak pressure: 21 mm Hg (S). - Inferior vena cava: The vessel was normal in size. The  respirophasic diameter changes were in the normal range (>= 50%),  consistent with normal central venous pressure.  Transthoracic echocardiography. M-mode, complete 2D, spectral Doppler, and color Doppler. Birthdate: Patient birthdate: 10/24/1970. Age: Patient is 45 yr old. Sex: Gender: female. BMI: 28 kg/m^2. Blood pressure: 103/65 Patient status: Inpatient. Study date: Study date: 08/02/2015. Study time: 10:30 AM. Location: Bedside.  -------------------------------------------------------------------  ------------------------------------------------------------------- Left ventricle: The cavity size was mildly dilated. Systolic function was moderately reduced. The estimated ejection fraction was in the range of 35% to 40%. Mild diffuse hypokinesis  with akinesis of the mid-apical anterior wall, mid-apical anteroseptum and apex. There was a thrombus. There was a 2.8 cm (L) x 1.4 cm (W), apicalthrombus. Features are consistent with a pseudonormal left ventricular filling pattern, with concomitant abnormal relaxation and increased filling pressure (grade 2 diastolic dysfunction).  ------------------------------------------------------------------- Aortic valve: Trileaflet; normal thickness leaflets. Mobility was not restricted. Doppler: Transvalvular velocity was within the normal range. There was no stenosis. There was no regurgitation.  ------------------------------------------------------------------- Aorta: Aortic root: The aortic root was normal in size.  ------------------------------------------------------------------- Mitral valve: Structurally normal valve. Mobility was not restricted. Doppler: Transvalvular velocity was within the normal range. There was no evidence for stenosis. There was mild regurgitation.  ------------------------------------------------------------------- Left atrium: The atrium was mildly dilated.  ------------------------------------------------------------------- Right ventricle: The cavity size was normal. Wall thickness was normal. Systolic function was mildly reduced.  ------------------------------------------------------------------- Pulmonic valve: Structurally normal valve. Cusp separation was normal. Doppler: Transvalvular velocity was within the normal range. There was no evidence for stenosis. There was no regurgitation.  ------------------------------------------------------------------- Tricuspid valve: Structurally normal valve. Doppler: Transvalvular velocity was within the normal range. There was mild regurgitation.  ------------------------------------------------------------------- Pulmonary artery: The main pulmonary artery was  normal-sized. Systolic pressure was within the normal range.  ------------------------------------------------------------------- Right atrium: The atrium was normal in size.  ------------------------------------------------------------------- Pericardium: There was no pericardial effusion.  ------------------------------------------------------------------- Systemic veins: Inferior vena cava: The vessel was normal in size. The respirophasic diameter changes were in the normal range (>= 50%), consistent with normal central venous pressure.  ------------------------------------------------------------------- Measurements  Left ventricle Value Reference LV ID, ED, PLAX chordal 51 mm 43 - 52 LV ID, ES, PLAX chordal (H) 40 mm 23 - 38 LV fx shortening, PLAX chordal (L) 22 % >=29 LV PW thickness, ED 8 mm --------- IVS/LV PW ratio, ED 1 <=1.3 LV end-diastolic volume, 1-p A2C 159 ml --------- LV end-systolic volume, 1-p A2C 133 ml --------- LV e&', lateral 5 cm/s --------- LV e&', medial 6.2 cm/s --------- LV e&', average 5.6 cm/s ---------  Ventricular septum Value Reference IVS thickness, ED 8 mm ---------  LVOT Value Reference LVOT ID, S 20 mm --------- LVOT area 3.14 cm^2 ---------  Aorta Value Reference  Aortic root ID, ED 29 mm ---------  Left atrium Value Reference LA ID, A-P,  ES 25 mm --------- LA ID/bsa, A-P 1.35 cm/m^2 <=2.2 LA volume, S 58.5 ml --------- LA volume/bsa, S 31.7 ml/m^2 --------- LA volume, ES, 1-p A4C 60.3 ml --------- LA volume/bsa, ES, 1-p A4C 32.6 ml/m^2 --------- LA volume, ES, 1-p A2C 56.8 ml --------- LA volume/bsa, ES, 1-p A2C 30.7 ml/m^2 ---------  Mitral valve Value Reference Mitral deceleration time 218 ms 150 - 230 Mitral E/A ratio, peak 1.3 ---------  Pulmonary arteries Value Reference PA pressure, S, DP 21 mm Hg <=30  Tricuspid valve Value Reference Tricuspid regurg peak velocity 210 cm/s --------- Tricuspid peak RV-RA gradient 18 mm Hg ---------  Systemic veins Value Reference Estimated CVP 3 mm Hg ---------  Right ventricle Value Reference TAPSE 12.8 mm --------- RV pressure, S, DP 21 mm Hg <=30 RV s&', lateral, S 7.29 cm/s ---------  Legend: (L) and (H) mark values outside specified reference range.  ------------------------------------------------------------------- Prepared and Electronically Authenticated by  Chilton Si, MD 2017-05-02T11:56:44      CARDIAC CATHETERIZATION Procedures    Coronary/Graft Angiography    Conclusion     Ost RPDA lesion, 40% stenosed.  Dist RCA lesion, 50% stenosed.  Prox Cx to Mid Cx lesion, 35% stenosed.  Ost Ramus to Ramus lesion, 60% stenosed.  Prox RCA  to Mid RCA lesion, 25% stenosed.  Prox LAD to Mid LAD lesion, 90% stenosed.  1. Diffuse coronary artery disease with small caliber vessels. The proximal to mid LAD has a segmental severe stenosis with appearance of dissection.  Plan: Initial medical therapy. Anticoagulation per primary team. Given Q waves on Ecg and extensive akinesis on Echo I would not recommend PCI of the LAD unless she has refractory angina on medical therapy or evidence of viability.     Indications    Cardiomyopathy, ischemic [I25.5 (ICD-10-CM)]    Technique and Indications    Indication: 45 yo BF presents with epigastric pain. Ecg shows anterior Q waves consistent with prior infarction. Echo shows anterior and apical akinesis with LV thrombus.  Procedural Details: The right wrist was prepped, draped, and anesthetized with 1% lidocaine. Using the modified Seldinger technique, a 6 French slender sheath was introduced into the right radial artery. 3 mg of verapamil was administered through the sheath, weight-based unfractionated heparin was administered intravenously. Standard Judkins catheters were used for selective coronary angiography. The LCA was difficult to engage with a judkins catheter so a EZRAD left catheter was used. Catheter exchanges were performed over an exchange length guidewire. There were no immediate procedural complications. A TR band was used for radial hemostasis at the completion of the procedure. The patient was transferred to the post catheterization recovery area for further monitoring.  Contrast: 80 cc  During this procedure the patient is administered a total of Versed 2 mg and Fentanyl 50 mg to achieve and maintain moderate conscious sedation. The patient's heart rate, blood pressure, and oxygen saturation are monitored continuously during the procedure. The period of conscious sedation is 30 minutes, of which I was present face-to-face 100% of this time.   Estimated blood loss <50 mL.  There were no immediate complications during the procedure.    Coronary Findings    Dominance: Right   Left Main  Vessel was injected. Vessel is normal in caliber. Vessel is angiographically normal.     Left Anterior Descending   . Prox LAD to Mid LAD lesion, 90% stenosed. Diffuse. Segmental dissection  Ramus Intermedius  . Vessel is small.   Suezanne Jacquet Ramus to Ramus lesion, 60% stenosed. Diffuse.     Left Circumflex   . Prox Cx to Mid Cx lesion, 35% stenosed. Diffuse.     Right Coronary Artery  . Vessel is small.   . Prox RCA to Mid RCA lesion, 25% stenosed. Diffuse.   . Dist RCA lesion, 50% stenosed.   . Right Posterior Descending Artery   . Ost RPDA lesion, 40% stenosed.      Coronary Diagrams    Diagnostic Diagram            Implants     No implant documentation for this case.    PACS Images    Show images for Cardiac catheterization     Link to Procedure Log    Procedure Log      Hemo Data       Most Recent Value   AO Systolic Pressure  104 mmHg   AO Diastolic Pressure  56 mmHg   AO Mean  75 mmHg     CARDIAC CATHETERIZATION Procedures    Left Heart Cath and Coronary Angiography    Conclusion     There is moderate to severe left ventricular systolic dysfunction.  Ost Ramus to Ramus lesion, 70% stenosed.  Ost LAD to Prox LAD lesion, 50% stenosed.  Prox LAD to Mid LAD lesion, 55% stenosed.  Prox Cx lesion, 20% stenosed.  Ost RCA lesion, 100% stenosed.  Moderately severe LV dysfunction with an ejection fraction of 30-35% with hypokinesis of the anterior to anterolateral wall with akinesis to dyskinesis in the distal anterior apical and apical segment with suggestion of a large, smooth bordered, probable mural thrombus apically extending to the distal anterolateral wall and focal basal inferior hypocontractility.  Previously documented dissection of the proximal LAD with improved filling and residual narrowing of 50-55%;  70% smooth eccentric proximal ramus stenosis, mild 20% left circumflex stenosis, and new total occlusion of the RCA at its origin with extensive left to right collaterals supplying almost the entire RCA territory distally to near [place].  Recommendation: Angiograms will be reviewed with colleagues. Since her catheterization of 08/04/2015, the RCA is now occluded [facility] which she has extensively collateralized via left injection. She continues to have residual LAD proximal dissection, but there is TIMI-3 flow. Smoking cessation is essential. It appears that she has an extensive left ventricular mural thrombus. An echocardiogram was ordered. She will need to be on anticoagulation long-term. In addition, she has reduced right femoral pulse and duplex imaging of her right lower extremity is recommended. Recommended.      Indications    NSTEMI (non-ST elevated myocardial infarction) (HCC) [I21.4 (ICD-10-CM)]   CAD in native artery [I25.10 (ICD-10-CM)]    Technique and Indications    Ms Vernal Rutan is a 45 year old African-American female who has a long-standing tobacco history and on 08/04/2015 underwent cardiac catheterization which revealed an LAD with dissection proximally with residual narrowing of 90%, not felt suitable for intervention, 60% ramus stenosis, and mild circumflex and RCA narrowing. At the time. Patient had felt to have had an out-of-hospital MI and had significant anterolateral and apical wall motion abnormality with apical akinesis. She still states that she was on eloquence for one month. Recently she has not been on any medications. She has continued to smoke. She was admitted with recurrent episodes of nausea and epigastric discomfort. Troponins are mildly positive. She is referred for cardiac catheterization.  The patient was brought  to the [facility] cardiac catherization laboratory in the fasting state. She was initially premedicated with Versed 2 mg and  fentanyl 25 g. Her right groin was prepped and shaved in usual sterile fashion. Xylocaine 1% was used for local anesthesia. The right femoral pulse was diminutive and after several unsuccessful attempts, the left groin was prepped and draped and ultimately used for the catheterization procedure. She received an additional Versed 1 mg and fentanyl 25 g. A 5 French sheath was inserted into the L femoral artery. Diagnostic catheterizatiion was done with 5 [place] FL4, FR4, and pigtail catheters. Left ventriculography was done with 28 cc of contrast. Hemostasis was obtained by direct manual compression. The patient tolerated the procedure well.  During this procedure the patient was administered a total of Versed 3 mg and Fentanyl 50 mg to achieve and maintain moderate conscious sedation. The patient's heart rate, blood pressure, and oxygen saturation were monitored continuously during the procedure. The period of conscious sedation was 60 minutes, of which I was present face-to-face 100% of this time. Estimated blood loss <50 mL. There were no immediate complications during the procedure.    Coronary Findings    Dominance: Right   Left Main  The left main was a long vessel that was angiographically normal and trifurcated into an LAD, a ramus intermediate, and left circumflex vessel.     Left Anterior Descending  The LAD had narrowing of 50% proximally before the first septal and then continue to have the appearance of a chronic dissection in the LAD between the first septal and diagonal vessel. This dissection was noted in the catheterization from 08/04/2015 at which time stenosis was described as 90% narrowing.   Suezanne Jacquet LAD to Prox LAD lesion, 50% stenosed.   . Prox LAD to Mid LAD lesion, 55% stenosed. There is evidence for diffuse dissection which is old in the proximal LAD between the septal and first diagonal vessel.     Ramus Intermedius  Ramus vessel was small vessel that had 70% smooth  eccentric proximal narrowing   . Ost Ramus to Ramus lesion, 70% stenosed.     Left Circumflex  Circumflex gave rise to one major marginal branch. There was mild 20% proximal narrowing   . Prox Cx lesion, 20% stenosed.     Right Coronary Artery  RCA was now totally occluded at its origin. There were extensive collaterals to the distal RCA from the left coronary injection, which supplied the the RCA distally extending all the way up to the proximal segment as well as the proximal branches.   Suezanne Jacquet RCA lesion, 100% stenosed.   . Acute Marginal Branch   Acute Mrg filled by collaterals from 3rd Sept.   . Right Posterior Descending Artery   RPDA filled by collaterals from 3rd Sept.      Wall Motion                 Left Heart    Left Ventricle There is moderate to severe left ventricular systolic dysfunction. There are wall motion abnormalities in the left ventricle. There was moderate reduced LV function with an ejection fraction at approximately 30-35%. There appeared to be a large region of hypodensity with a straight border stenting from the distal inferior segment up to the distal anterolateral segment, suggestive of a mural thrombus. The apex was akinetic to dyskinetic    Coronary Diagrams    Diagnostic Diagram            Implants  No implant documentation for this case.    PACS Images    Show images for Cardiac catheterization     Link to Procedure Log    Procedure Log      Hemo Data       Most Recent Value   AO Systolic Pressure  105 mmHg   AO Diastolic Pressure  68 mmHg   AO Mean  85 mmHg   LV Systolic Pressure  107 mmHg   LV Diastolic Pressure  10 mmHg   LV EDP  18 mmHg   Arterial Occlusion Pressure Extended Systolic Pressure  109 mmHg   Arterial Occlusion Pressure Extended Diastolic Pressure  61 mmHg   Arterial Occlusion Pressure Extended Mean Pressure  79 mmHg   Left Ventricular Apex Extended Systolic Pressure  109 mmHg    Left Ventricular Apex Extended Diastolic Pressure  8 mmHg   Left Ventricular Apex Extended EDP Pressure  15 mmHg     Transthoracic Echocardiography  Patient: Marchella, Hibbard MR #: [id number] Study Date: 10/11/2015 Gender: F Age: 29 Height: 165.1 cm Weight: 70.1 kg BSA: 1.81 m^2 Pt. Status: Room: 7W29F  Ulice Brilliant, M.D. REFERRING Nicki Guadalajara, M.D. ADMITTING Lorine Bears, MD SONOGRAPHER Lanae Crumbly, RDCS PERFORMING [facility], Inpatient ATTENDING [place], [place]  cc:  ------------------------------------------------------------------- LV EF: 30% - 35%  ------------------------------------------------------------------- Indications: Abnormal EKG 794.31.  ------------------------------------------------------------------- History: PMH: LV thrombus noted on echo performed 08-02-2015. Ischemic cardiomyopathy. PMH: Myocardial infarction. Risk factors: Current tobacco use.  ------------------------------------------------------------------- Study Conclusions  - Left ventricle: The cavity size was moderately dilated. Wall  thickness was increased in a pattern of mild LVH. There was focal  basal hypertrophy. Systolic function was moderately to severely  reduced. The estimated ejection fraction was in the range of 30%  to 35%. Dyskinesis of the apicalanteroseptal, lateral, and apical  myocardium. Features are consistent with a pseudonormal left  ventricular filling pattern, with concomitant abnormal relaxation  and increased filling pressure (grade 2 diastolic dysfunction).  There was a large, 3.5 cm (L) x 2.2 cm (W)thrombus. - Mitral valve: There was mild regurgitation. - Left atrium: The atrium was mildly dilated.  Urgent and Critical Findings: A critical finding, .  ------------------------------------------------------------------- Study data: The previous  study was not available, so comparison was made to the report of 08/02/2015. Study status: Routine. Procedure: The patient reported no pain pre or post test. Transthoracic echocardiography. Image quality was adequate. Study completion: There were no complications. Transthoracic echocardiography. M-mode, complete 2D, spectral Doppler, and color Doppler. Birthdate: Patient birthdate: 20-Jan-1971. Age: Patient is 45 yr old. Sex: Gender: female. BMI: 25.7 kg/m^2. Blood pressure: 104/68 Patient status: Inpatient. Study date: Study date: 10/11/2015. Study time: 09:12 AM. Location: Bedside.  -------------------------------------------------------------------  ------------------------------------------------------------------- Left ventricle: The cavity size was moderately dilated. Wall thickness was increased in a pattern of mild LVH. There was focal basal hypertrophy. Systolic function was moderately to severely reduced. The estimated ejection fraction was in the range of 30% to 35%. There was a large, 3.5 cm (L) x 2.2 cm (W)thrombus. Regional wall motion abnormalities: Dyskinesis of the apicalanteroseptal, lateral, and apical myocardium. Features are consistent with a pseudonormal left ventricular filling pattern, with concomitant abnormal relaxation and increased filling pressure (grade 2 diastolic dysfunction).  ------------------------------------------------------------------- Aortic valve: Structurally normal valve. Cusp separation was normal. Doppler: Transvalvular velocity was within the normal range. There was no stenosis. There was no regurgitation.  ------------------------------------------------------------------- Aorta: Aortic root: The aortic root was normal in size. Ascending aorta: The ascending aorta was  normal in size.  ------------------------------------------------------------------- Mitral valve: Structurally  normal valve. Leaflet separation was normal. Doppler: Transvalvular velocity was within the normal range. There was no evidence for stenosis. There was mild regurgitation. Peak gradient (D): 4 mm Hg.  ------------------------------------------------------------------- Left atrium: The atrium was mildly dilated.  ------------------------------------------------------------------- Right ventricle: The cavity size was normal. Systolic function was normal.  ------------------------------------------------------------------- Pulmonic valve: Structurally normal valve. Cusp separation was normal. Doppler: Transvalvular velocity was within the normal range. There was no regurgitation.  ------------------------------------------------------------------- Tricuspid valve: The valve appears to be grossly normal. Doppler: There was trivial regurgitation.  ------------------------------------------------------------------- Right atrium: The atrium was normal in size.  ------------------------------------------------------------------- Pericardium: There was no pericardial effusion.  ------------------------------------------------------------------- Systemic veins: Inferior vena cava: The vessel was normal in size. The respirophasic diameter changes were in the normal range (>= 50%), consistent with normal central venous pressure.  ------------------------------------------------------------------- Measurements  Left ventricle Value Reference LV ID, ED, PLAX chordal (H) 53.2 mm 43 - 52 LV ID, ES, PLAX chordal (H) 48 mm 23 - 38 LV fx shortening, PLAX chordal (L) 10 % >=29 LV PW thickness, ED 9.3 mm --------- IVS/LV PW ratio, ED 0.85 <=1.3 LV ejection fraction, 1-p A4C 31 % --------- LV  end-diastolic volume, 2-p 89 ml --------- LV end-systolic volume, 2-p 58 ml --------- LV ejection fraction, 2-p 35 % --------- Stroke volume, 2-p 31 ml --------- LV end-diastolic volume/bsa, 2-p 49 ml/m^2 --------- LV end-systolic volume/bsa, 2-p 32 ml/m^2 --------- Stroke volume/bsa, 2-p 17.1 ml/m^2 --------- LV e&', lateral 8.49 cm/s --------- LV E/e&', lateral 11.26 --------- LV s&', lateral 6.6 cm/s --------- LV e&', medial 7.29 cm/s --------- LV E/e&', medial 13.11 --------- LV e&', average 7.89 cm/s --------- LV E/e&', average 12.12 ---------  Ventricular septum Value Reference IVS thickness, ED 7.93 mm ---------  LVOT Value Reference LVOT ID, S 18 mm --------- LVOT area 2.54 cm^2 ---------  Aorta Value Reference Aortic root ID, ED 25 mm ---------  Left atrium Value Reference LA ID, A-P, ES 33 mm --------- LA ID/bsa, A-P 1.83 cm/m^2 <=2.2 LA volume, S 66.2 ml --------- LA volume/bsa, S 36.6 ml/m^2 --------- LA volume, ES, 1-p A4C 67.1 ml --------- LA volume/bsa, ES, 1-p A4C 37.1 ml/m^2 --------- LA volume, ES, 1-p A2C 63.4 ml --------- LA volume/bsa, ES,  1-p A2C 35.1 ml/m^2 ---------  Mitral valve Value Reference Mitral E-wave peak velocity 95.6 cm/s --------- Mitral A-wave peak velocity 81 cm/s --------- Mitral deceleration time 204 ms 150 - 230 Mitral peak gradient, D 4 mm Hg --------- Mitral E/A ratio, peak 1.2 ---------  Systemic veins Value Reference Estimated CVP 3 mm Hg ---------  Right ventricle Value Reference TAPSE 20.3 mm --------- RV s&', lateral, S 9 cm/s ---------  Legend: (L) and (H) mark values outside specified reference range.  ------------------------------------------------------------------- Prepared and Electronically Authenticated by  Kristeen Miss, M.D. 2017-07-11T10:40:30   CARDIAC MRI  TECHNIQUE: The patient was scanned on a 1.5 Tesla GE magnet. A dedicated cardiac coil was used. Functional imaging was done using Fiesta sequences. 2,3, and 4 chamber views were done to assess for RWMA's. Modified Simpson's rule using a short axis stack was used to calculate an ejection fraction on a dedicated work [employer]. The patient received 25 cc of Multihance. After 10 minutes inversion recovery sequences were used to assess for infiltration and scar tissue.  CONTRAST: 25 cc of Multihance  FINDINGS: 1. Moderately dilated left ventricle with normal wall thickness and moderately to severely impaired systolic function (LVEF = 35%).  There  is hypokinesis in the basal inferior and akinesis in the mid anterior, anteroseptal and apical anterior, septal and inferior walls.  LVEDD: 61 mm  LVESD: 49 mm  LVEDV: 196 ml  LVESV: 127 ml  69  ml  CO: 4.8 L/min  Myocardial mass: 120 g  2. Normal right ventricular size, thickness and systolic function (RVEF = 56%) with no regional wall motion abnormalities.  3. Mild left atrial dilatation.  4. Normal size of the aortic root, ascending aorta and pulmonary artery.  5. Mild mitral and trivial tricuspid regurgitation.  6. There is late gadolinium enhancement in the following segments:  Basal inferior wall: 25-50% (good prognosis if revascularized)  Mid anteroseptal wall: 75-100% (poor prognosis if revascularized)  Mid anterior wall: 25-50% (good prognosis if revascularized)  Apical septal wall: 75-100% (poor prognosis if revascularized)  Apical anterior wall: 75-100% (poor prognosis if revascularized)  Apical inferior wall: 50-75% (fair prognosis if revascularized)  True apex: 75-100% (poor prognosis if revascularized)  7. There is a large partially mobile thrombus in the LV apex attached to the apical anterior wall measuring 42 x 35 x 32 mm.  IMPRESSION: 1. Moderately dilated left ventricle with normal wall thickness and moderately to severely impaired systolic function (LVEF = 35%).  There is hypokinesis in the basal inferior and akinesis in the mid anterior, anteroseptal and apical anterior, septal and inferior walls.  There is a large partially mobile thrombus in the LV apex attached to the apical anterior wall measuring 42 x 35 x 32 mm.  Out of 7 myocardial segments affected by infarction there is preserved viability in the basal inferior and mid anterior walls with good prognosis if revascularized and fair prognosis in the apical inferior wall. There is a transmural infarct in the mid anteroseptal, apical septal, anterior walls and in the true apex with poor prognosis if revascularized.  2. Normal right ventricular size, thickness and systolic function (RVEF = 56%) with no regional wall motion abnormalities.  3. Mild  left atrial dilatation.  4. Normal size of the aortic root, ascending aorta and pulmonary artery.  5. Mild mitral and trivial tricuspid regurgitation.  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 She originally presented with symptoms of epigastric abdominal pain consistent with unstable angina in early May. 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 At that time the left anterior descending coronary artery was open with normal antegrade flow, and medical therapy was recommended. She now presents with recent acute inferior wall myocardial infarction with 100% occlusion of the right coronary artery.  At present she remains clinically stable and essentially symptom-free on medical therapy. I have personally reviewed both of the patient's recent diagnostic cardiac catheterizations, both echocardiograms, and her cardiac gated MRI. She has severe three-vessel coronary artery disease and has suffered at least 2 myocardial infarctions. She still has extensive viable myocardium in both the high anterior wall and the inferior wall. 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 I feel the patient would likely benefit from surgical revascularization.    Plan:  I have discussed the nature of the patient's clinical problems and the results of all of her diagnostic tests with  the patient and her daughter this afternoon. The indications, risks, and potential benefits of coronary artery bypass grafting were discussed. Alternative treatment strategies were discussed at length  including long-term medical therapy versus PCI and stenting. The timing of surgical revascularization was discussed including whether not to proceed with surgery in the near future with or without plication of the left ventricular aneurysm and removal of neural thrombus versus delayed surgical revascularization after a several month period of time on long-term anticoagulation therapy. Long-term prognosis with and without surgical revascularization was discussed. Long-term prognosis with or without smoking cessation was discussed and the patient was strongly cautioned regarding the need to quit smoking completely. All of her questions have been addressed. The patient is not interested in discussing matters further at this time and she states that she needs time to think about treatment options further. We will continue to follow along intermittently and assist with her care as desired.   I spent in excess of 120 minutes during the conduct of this hospital consultation and >50% of this time involved direct face-to-face encounter for counseling and/or coordination of the patient's care.    Salvatore Decent. Cornelius Moras, MD 10/13/2015 2:26 PM

## 2015-10-14 ENCOUNTER — Encounter (HOSPITAL_COMMUNITY): Payer: Self-pay

## 2015-10-14 ENCOUNTER — Inpatient Hospital Stay (HOSPITAL_COMMUNITY): Payer: MEDICAID

## 2015-10-14 ENCOUNTER — Inpatient Hospital Stay (HOSPITAL_COMMUNITY): Payer: Self-pay

## 2015-10-14 ENCOUNTER — Telehealth: Payer: Self-pay | Admitting: *Deleted

## 2015-10-14 ENCOUNTER — Encounter (HOSPITAL_COMMUNITY): Payer: Self-pay | Admitting: Cardiology

## 2015-10-14 DIAGNOSIS — Z7901 Long term (current) use of anticoagulants: Secondary | ICD-10-CM

## 2015-10-14 DIAGNOSIS — N390 Urinary tract infection, site not specified: Secondary | ICD-10-CM

## 2015-10-14 HISTORY — DX: Urinary tract infection, site not specified: N39.0

## 2015-10-14 HISTORY — DX: Long term (current) use of anticoagulants: Z79.01

## 2015-10-14 LAB — BLOOD GAS, ARTERIAL
Acid-base deficit: 0.9 mmol/L (ref 0.0–2.0)
BICARBONATE: 23 meq/L (ref 20.0–24.0)
Drawn by: 437071
FIO2: 0.21
O2 Saturation: 97.8 %
PCO2 ART: 36.3 mmHg (ref 35.0–45.0)
PH ART: 7.416 (ref 7.350–7.450)
PO2 ART: 86 mmHg (ref 80.0–100.0)
Patient temperature: 98
TCO2: 24.2 mmol/L (ref 0–100)

## 2015-10-14 LAB — PULMONARY FUNCTION TEST
DL/VA % pred: 105 %
DL/VA: 5.21 ml/min/mmHg/L
DLCO UNC % PRED: 56 %
DLCO UNC: 14.53 ml/min/mmHg
DLCO cor % pred: 63 %
DLCO cor: 16.18 ml/min/mmHg
FEF 25-75 PRE: 1.17 L/s
FEF 25-75 Post: 1.07 L/sec
FEF2575-%Change-Post: -8 %
FEF2575-%PRED-POST: 39 %
FEF2575-%Pred-Pre: 43 %
FEV1-%Change-Post: -2 %
FEV1-%PRED-POST: 57 %
FEV1-%PRED-PRE: 58 %
FEV1-POST: 1.45 L
FEV1-PRE: 1.49 L
FEV1FVC-%Change-Post: 8 %
FEV1FVC-%Pred-Pre: 88 %
FEV6-%CHANGE-POST: -9 %
FEV6-%PRED-POST: 60 %
FEV6-%Pred-Pre: 66 %
FEV6-PRE: 2.04 L
FEV6-Post: 1.85 L
FEV6FVC-%CHANGE-POST: 0 %
FEV6FVC-%PRED-POST: 101 %
FEV6FVC-%PRED-PRE: 101 %
FVC-%Change-Post: -10 %
FVC-%Pred-Post: 59 %
FVC-%Pred-Pre: 66 %
FVC-Post: 1.85 L
FVC-Pre: 2.06 L
POST FEV6/FVC RATIO: 100 %
Post FEV1/FVC ratio: 78 %
Pre FEV1/FVC ratio: 72 %
Pre FEV6/FVC Ratio: 100 %
RV % PRED: 87 %
RV: 1.52 L
TLC % PRED: 67 %
TLC: 3.52 L

## 2015-10-14 LAB — LIPID PANEL
CHOL/HDL RATIO: 2.6 ratio
CHOLESTEROL: 113 mg/dL (ref 0–200)
HDL: 43 mg/dL (ref >40)
LDL Cholesterol: 57 mg/dL (ref 0–99)
TRIGLYCERIDES: 67 mg/dL (ref <150)
VLDL: 13 mg/dL (ref 0–40)

## 2015-10-14 MED ORDER — ACETAMINOPHEN 325 MG PO TABS: 650.0000 mg | ORAL_TABLET | ORAL | Status: DC | PRN

## 2015-10-14 MED ORDER — ALBUTEROL SULFATE (2.5 MG/3ML) 0.083% IN NEBU
2.5000 mg | INHALATION_SOLUTION | Freq: Once | RESPIRATORY_TRACT | Status: AC
  Administered 2015-10-14: 2.5 mg via RESPIRATORY_TRACT

## 2015-10-14 MED ORDER — ATORVASTATIN CALCIUM 80 MG PO TABS: 80.0000 mg | ORAL_TABLET | Freq: Every day | ORAL | Status: DC

## 2015-10-14 MED ORDER — APIXABAN 5 MG PO TABS: 5.0000 mg | ORAL_TABLET | Freq: Two times a day (BID) | ORAL | Status: DC

## 2015-10-14 MED ORDER — ASPIRIN 81 MG PO TBEC: 81.0000 mg | DELAYED_RELEASE_TABLET | Freq: Every day | ORAL | Status: DC

## 2015-10-14 MED ORDER — LISINOPRIL 2.5 MG PO TABS: 2.5000 mg | ORAL_TABLET | Freq: Every day | ORAL | Status: DC

## 2015-10-14 MED ORDER — NITROGLYCERIN 0.4 MG SL SUBL: 0.4000 mg | SUBLINGUAL_TABLET | SUBLINGUAL | Status: DC | PRN

## 2015-10-14 MED ORDER — METOPROLOL SUCCINATE ER 25 MG PO TB24: 25.0000 mg | ORAL_TABLET | Freq: Every day | ORAL | Status: DC

## 2015-10-14 MED ORDER — PANTOPRAZOLE SODIUM 40 MG PO TBEC: 40.0000 mg | DELAYED_RELEASE_TABLET | Freq: Every day | ORAL | Status: DC

## 2015-10-14 MED ORDER — NICOTINE 14 MG/24HR TD PT24: 14.0000 mg | MEDICATED_PATCH | Freq: Every day | TRANSDERMAL | Status: DC

## 2015-10-14 MED FILL — METOPROLOL SUCC ER 25 MG TA: 25 | 30 days supply | Qty: 30 | Fill #0

## 2015-10-14 MED FILL — PANTOPRAZOLE SOD DR 40 MG T: 40 | 30 days supply | Qty: 30 | Fill #0

## 2015-10-14 MED FILL — LISINOPRIL 2.5 MG TABLET: 2.5 | 30 days supply | Qty: 30 | Fill #0

## 2015-10-14 MED FILL — ATORVASTATIN 80 MG TABLET: 80 | 30 days supply | Qty: 30 | Fill #0

## 2015-10-14 NOTE — Discharge Summary (Signed)
Physician Discharge Summary       Patient ID: CAMYRA VAETH MRN: [id number] DOB/AGE: 45-May-1972 45 y.o.  Admit date: 10/08/2015 Discharge date: 10/14/2015 Primary Cardiologist: Dr. Stanford Breed   Discharge Diagnoses:  Principal Problem:   NSTEMI (non-ST elevated myocardial infarction) [facility]) Active Problems:   LV (left ventricular) mural thrombus (HCC)   Coronary atherosclerosis of native coronary artery, multivessel with plans for CABG to be followed closely     Old anterior myocardial infarction   Tobacco abuse   Epigastric pain   Cardiomyopathy, ischemic   Anticoagulation adequate   UTI (urinary tract infection)   Discharged Condition: good  Procedures: 10/10/15  Cardiac cath by Dr. Claiborne Billings  Procedures    Left Heart Cath and Coronary Angiography    Conclusion     There is moderate to severe left ventricular systolic dysfunction.  Ost Ramus to Ramus lesion, 70% stenosed.  Ost LAD to Prox LAD lesion, 50% stenosed.  Prox LAD to Mid LAD lesion, 55% stenosed.  Prox Cx lesion, 20% stenosed.  Ost RCA lesion, 100% stenosed.  Moderately severe LV dysfunction with an ejection fraction of 30-35% with hypokinesis of the anterior to anterolateral wall with akinesis to dyskinesis in the distal anterior apical and apical segment with suggestion of a large, smooth bordered, probable mural thrombus apically extending to the distal anterolateral wall and focal basal inferior hypocontractility.  Previously documented dissection of the proximal LAD with improved filling and residual narrowing of 50-55%; 70% smooth eccentric proximal ramus stenosis, mild 20% left circumflex stenosis, and new total occlusion of the RCA at its origin with extensive left to right collaterals supplying almost the entire RCA territory distally to near [place].  Recommendation: Angiograms will be reviewed with colleagues. Since her catheterization of 08/04/2015, the RCA is now occluded [facility] which she  has extensively collateralized via left injection. She continues to have residual LAD proximal dissection, but there is TIMI-3 flow. Smoking cessation is essential. It appears that she has an extensive left ventricular mural thrombus. An echocardiogram was ordered. She will need to be on anticoagulation long-term. In addition, she has reduced right femoral pulse and duplex imaging of her right lower extremity is recommended. Recommended.     ECHO:  ------------------------------------------------------------------- LV EF: 30% - 35%  ------------------------------------------------------------------- Indications: Abnormal EKG 794.31.  ------------------------------------------------------------------- History: PMH: LV thrombus noted on echo performed 08-02-2015. Ischemic cardiomyopathy. PMH: Myocardial infarction. Risk factors: Current tobacco use.  ------------------------------------------------------------------- Study Conclusions  - Left ventricle: The cavity size was moderately dilated. Wall  thickness was increased in a pattern of mild LVH. There was focal  basal hypertrophy. Systolic function was moderately to severely  reduced. The estimated ejection fraction was in the range of 30%  to 35%. Dyskinesis of the apicalanteroseptal, lateral, and apical  myocardium. Features are consistent with a pseudonormal left  ventricular filling pattern, with concomitant abnormal relaxation  and increased filling pressure (grade 2 diastolic dysfunction).  There was a large, 3.5 cm (L) x 2.2 cm (W)thrombus. - Mitral valve: There was mild regurgitation. - Left atrium: The atrium was mildly dilated.  Urgent and Critical Findings: A critical finding, .   Hospital Course:   45 year old woman with history of CAD, not revascularized, EF 35-40% / ischemic cardiomyopathy, LV thrombus. Cardiac catheterization May 2017 showed a 90% proximal to mid LAD (possible  dissection), 60% ramus and 50% right coronary artery. At that time she presented with abdominal discomfort and nausea and given her akinetic anterior wall, it was thought that  she had likely completed an anterolateral infarct and medical management was started.   She followed in clinic with Dr. Stanford Breed and has now been off all medications. She recalls being told that she only needed one month of anticoagulation and no aspirin therapy although notes she that she should at least be on anticoagulation. At this point, she is on no medicines.  Over the past few days prior to admit on 10/08/15, she had experienced nausea/vommiting and epigastric discomfort. These are similar symptoms that led her to the hospital in May. She has never had chest pain.   In ED: ECG showing new inferior TWI, stable appearance to anterolateral QW. ISTAT troponin elevated to 0.61, Troponin I 1.2. RUQ checked and wnl. Started on heparin ACS protocol and given 325 ASA.   She was admitted on meds as above.  Troponin peaked at 1.17 then down to 0.91.  Cardiac cath was planned with results as above.     She was followed and continued with some abd. pain.  abd film with normal gallbladder, increased liver echogenicity commonly represents hepatic steatosis.  Dr. Tamala Julian felt this was costochondritis.    Pt had MRI viability study with recent MI.  She has has extensive viable myocardium in both the high anterior wall and the inferior wall. 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   Pt was restarted on Lisinopril and metoprolol along with Eliquis and ASA.     With results of MRI see below-- Dr. Tamala Julian " discussed this patient with interventional colleagues concerning an interventional approach such as CT oh intervention on the RCA and attempted stenting of the LAD and ramus intermedius. This is possible although would be of uncertain  outcome because of evidence of dissection in the LAD.  Therefore will also ask for a surgical consultation to contemplate multivessel revascularization within the next one to 3 months as we allow the apical thrombus to organize."  She met with Dr. Roxy Manns 10/13/15 and 10/14/15.  Dr. Roxy Manns felt she would do best with CABG.  She is willing to undergo coronary bypass grafting. Dr. Roxy Manns has suggested additional anticoagulation therapy to insure organization of the LV thrombus. The consensus opinion is that we should delay CABG until LV mural thrombus has resolved.  She will follow up in our office in 7-10 days TOC.     She denies chest pain. Right upper quadrant discomfort is musculoskeletal. No dyspnea on exertion. Dr. Tamala Julian had a long discussion concerning smoking cessation and medication compliance. She had medication assistance forms completed to insure that she will be able to get Eliquis.  Our office has supplied the first month of samples, she had already used 30 days free card.    Pt states she understands now that she needs to continue the medications.   She has been depressed and tearful with the diagnosis.    Plan is for 2 consecutive months of anticoagulation therapy prior to CABG.  She ambulated in the hall with cardiac rehab without complications.  Found stable for discharge by Dr. Tamala Julian.  Scheduled TOC appt.    PLEASE CHECK UA as outpt.  + Nitrites on admit UTI, treated with Rocephin and Keflex.     On ACE-Lisinopril On BB -toprol Asa Statin- Lipitor 80 mg  Consults: vascular surgery  Significant Diagnostic Studies:  BMP Latest Ref Rng 10/13/2015 10/12/2015 10/11/2015  Glucose 65 - 99 mg/dL 96 118(H) 122(H)  BUN 6 - 20 mg/dL <5(L) <5(L) <5(L)  Creatinine 0.44 - 1.00 mg/dL 0.51 0.58 0.64  Sodium 135 - 145 mmol/L 137 136 134(L)  Potassium 3.5 - 5.1 mmol/L 4.4 3.3(L) 4.0  Chloride 101 - 111 mmol/L 109 105 102  CO2 22 - 32 mmol/L '25 26 24  '$ Calcium 8.9 - 10.3 mg/dL 8.3(L) 8.2(L)  8.7(L)   CBC Latest Ref Rng 10/12/2015 10/11/2015 10/10/2015  WBC 4.0 - 10.5 K/uL 7.4 7.7 6.4  Hemoglobin 12.0 - 15.0 g/dL 10.5(L) 11.6(L) 11.7(L)  Hematocrit 36.0 - 46.0 % 31.7(L) 35.5(L) 34.5(L)  Platelets 150 - 400 K/uL 284 273 253    Troponin pk at 1.71  Lipid Panel     Component Value Date/Time   CHOL 113 10/14/2015 0357   TRIG 67 10/14/2015 0357   HDL 43 10/14/2015 0357   CHOLHDL 2.6 10/14/2015 0357   VLDL 13 10/14/2015 0357   LDLCALC 57 10/14/2015 0357   Hepatic Function Latest Ref Rng 10/08/2015 08/03/2015 08/02/2015  Total Protein 6.5 - 8.1 g/dL 7.6 6.5 6.1(L)  Albumin 3.5 - 5.0 g/dL 3.2(L) 3.0(L) 2.9(L)  AST 15 - 41 U/L 66(H) 62(H) 94(H)  ALT 14 - 54 U/L 50 76(H) 91(H)  Alk Phosphatase 38 - 126 U/L 126 74 81  Total Bilirubin 0.3 - 1.2 mg/dL 0.6 1.0 1.1  Bilirubin, Direct 0.1 - 0.5 mg/dL - 0.2 0.3       Discharge Exam: Blood pressure 121/62, pulse 73, temperature 98.5 F (36.9 C), temperature source Oral, resp. rate 18, height '5\' 5"'$  (1.651 m), weight 168 lb 1.6 oz (76.25 kg), SpO2 100 %.  Disposition: 01-Home or Self Care      Discharge Instructions    Amb Referral to Cardiac Rehabilitation    Complete by:  As directed   Diagnosis:  NSTEMI            Medication List    TAKE these medications        acetaminophen 325 MG tablet  Commonly known as:  TYLENOL  Take 2 tablets (650 mg total) by mouth every 4 (four) hours as needed for headache or mild pain.     apixaban 5 MG Tabs tablet  Commonly known as:  ELIQUIS  Take 1 tablet (5 mg total) by mouth 2 (two) times daily.     aspirin 81 MG EC tablet  Take 1 tablet (81 mg total) by mouth daily.     atorvastatin 80 MG tablet  Commonly known as:  LIPITOR  Take 1 tablet (80 mg total) by mouth daily.     lisinopril 2.5 MG tablet  Commonly known as:  PRINIVIL,ZESTRIL  Take 1 tablet (2.5 mg total) by mouth daily.     metoprolol succinate 25 MG 24 hr tablet  Commonly known as:  TOPROL-XL  Take 1 tablet (25  mg total) by mouth daily.     nicotine 14 mg/24hr patch  Commonly known as:  NICODERM CQ - dosed in mg/24 hours  Place 1 patch (14 mg total) onto the skin daily.     nitroGLYCERIN 0.4 MG SL tablet  Commonly known as:  NITROSTAT  Place 1 tablet (0.4 mg total) under the tongue every 5 (five) minutes x 3 doses as needed for chest pain.     pantoprazole 40 MG tablet  Commonly known as:  PROTONIX  Take 1 tablet (40 mg total) by mouth daily at 6 (six) AM.       Follow-up Information    Follow up with  [facility] on 10/18/2015.   Specialty:  Internal Medicine   Why:  at 2:45pm for a hospital follow up appointment with Dr Lisette Grinder information:   [address] [id number] [place] [phone]      Follow up with [facility] On 10/14/2015.   Specialty:  Cardiology   Why:  please go by the above office and ask person at desk for your meds.  you will receive a month supply.   Contact information:   [address], [place] [phone]      Follow up with Truitt Merle, NP On 10/24/2015.   Specialties:  Nurse Practitioner, Interventional Cardiology, Cardiology, Radiology   Why:  at 8:30 AM at the [facility] office.  She is Dr. Jacalyn Lefevre Nurse Practitioner   Contact information:   [address] [place] [address] [phone]       Follow up with Kirk Ruths, MD On 11/11/2015.   Specialty:  Cardiology   Why:  at 9:30 AM At the [facility].     Contact information:   [address] [place] [address] [phone]       Follow up with Rexene Alberts, MD On 11/14/2015.   Specialty:  Cardiothoracic Surgery   Why:  at 1230 pm -- very important    Contact information:   [place] [address] [phone]        Discharge Instructions: Take your Eliquis twice every day.  Pick up  1 month of samples at our [facility]- it is on the 3 rd floor.  Just ask at the desk.  Heart Healthy Diet.   Very Important to take your medications on time and to keep appointments.    Take 1 NTG, under your tongue, while sitting.  If no relief of pain may repeat NTG, one tab every 5 minutes up to 3 tablets total over 15 minutes.  If no relief CALL 911.  If you have dizziness/lightheadness  while taking NTG, stop taking and call 911.            Signed: Cecilie Kicks Nurse Practitioner-Certified [facility]: [facility] 10/14/2015, 4:19 PM  Time spent on discharge :> 35  minutes.   The patient has been seen in conjunction with Cecilie Kicks, NP. All aspects of care have been considered and discussed. The patient has been personally interviewed, examined, and all clinical data has been reviewed.   Complicated and somewhat sad clinical situation and this 45 year old African-American female with undiagnosed depression and ischemic heart disease. She has spontaneous LAD dissection with large anteroapical infarction an intercurrent total occlusion of the right coronary and ramus intermedius. LVEF is 35%. There is a large LV thrombus.  She has been evaluated by cardiac surgery, Dr. Remus Loffler. She is a candidate for bypass surgery.  She will be discharged on Eliquis and aspirin. We hope to complete another 6-8 weeks of anticoagulation therapy prior to coronary bypass grafting. The goal is to allow organization of apical thrombus to prevent embolic stroke prior to surgery.  Medical regimen is as noted above.  She will follow-up with cardiac surgery in 6-8 weeks.  She will follow-up with her primary cardiologist, Dr. Stanford Breed in 7-10 days.  We were able to get samples of Eliquis for one month. We have completed paperwork  for the drug program to allow her to give this medication without cost. We need to follow her closely to ensure that  anticoagulation therapy is continued without interruption.

## 2015-10-14 NOTE — Progress Notes (Signed)
TCTS BRIEF PROGRESS NOTE   Patient remains asymptomatic.  She has expressed an interest in CABG at some point and she states that she intends to quit smoking.   I have reviewed her cath films and case w/ several of my partners.  The consensus opinion is that we should delay CABG until LV mural thrombus has resolved.  I will be happy to see her back in the office once this has occurred, presuming that she remains compliant with medical therapy and has successfully quit smoking.  Patient understands.  All questions answered.  Please call if we can be of further assistance.  I spent in excess of 15 minutes during the conduct of this hospital encounter and >50% of this time involved direct face-to-face encounter with the patient for counseling and/or coordination of their care.   Purcell Nails, MD 10/14/2015 8:44 AM

## 2015-10-14 NOTE — Discharge Instructions (Signed)
Take your Eliquis twice every day.  Pick up 1 month of samples at our [facility]- [address]- it is on the 3 rd floor.  Just ask at the desk.  Heart Healthy Diet.   Very Important to take your medications on time and to keep appointments.    Take 1 NTG, under your tongue, while sitting.  If no relief of pain may repeat NTG, one tab every 5 minutes up to 3 tablets total over 15 minutes.  If no relief CALL 911.  If you have dizziness/lightheadness  while taking NTG, stop taking and call 911.

## 2015-10-14 NOTE — Progress Notes (Signed)
Patient Name: Casey [name] Date of Encounter: 10/14/2015    SUBJECTIVE: She is asymptomatic. She met with Dr. Roxy Manns last evening and also this morning. She is willing to undergo coronary bypass grafting. Dr. Earnie Larsson has suggested additional anticoagulation therapy to insure organization of the LV thrombus. She denies chest pain. Right upper quadrant discomfort is musculoskeletal. No dyspnea on exertion. Long discussion concerning smoking cessation and medication compliance. She needs to have medication assistance forms completed to insure that she will be able to get Eliquis.  TELEMETRY:  Normal sinus rhythm Filed Vitals:   10/13/15 1040 10/13/15 1440 10/13/15 2133 10/14/15 0403  BP: 105/62 117/62 128/63 131/76  Pulse: 82 79 82 93  Temp:  98.3 F (36.8 C) 98.3 F (36.8 C) 98 F (36.7 C)  TempSrc:  Oral Oral Oral  Resp:  '18 18 18  '$ Height:      Weight:    168 lb 1.6 oz (76.25 kg)  SpO2:  100% 100% 100%    Intake/Output Summary (Last 24 hours) at 10/14/15 0918 Last data filed at 10/13/15 1300  Gross per 24 hour  Intake    240 ml  Output      0 ml  Net    240 ml   LABS: Basic Metabolic Panel:  Recent Labs  10/12/15 0305 10/13/15 0506  NA 136 137  K 3.3* 4.4  CL 105 109  CO2 26 25  GLUCOSE 118* 96  BUN <5* <5*  CREATININE 0.58 0.51  CALCIUM 8.2* 8.3*   CBC:  Recent Labs  10/12/15 0305  WBC 7.4  HGB 10.5*  HCT 31.7*  MCV 94.6  PLT 284   Cardiac Enzymes: No results for input(s): CKTOTAL, CKMB, CKMBINDEX, TROPONINI in the last 72 hours. BNP: Invalid input(s): POCBNP Hemoglobin A1C: No results for input(s): HGBA1C in the last 72 hours. Fasting Lipid Panel:  Recent Labs  10/14/15 0357  CHOL 113  HDL 43  LDLCALC 57  TRIG 67  CHOLHDL 2.6    Radiology/Studies:  No new data  Physical Exam: Blood pressure 131/76, pulse 93, temperature 98 F (36.7 C), temperature source Oral, resp. rate 18, height '5\' 5"'$  (1.651 m), weight 168 lb 1.6 oz  (76.25 kg), SpO2 100 %. Weight change: 3 lb 10.2 oz (1.65 kg)  Wt Readings from Last 3 Encounters:  10/14/15 168 lb 1.6 oz (76.25 kg)  08/10/15 163 lb 3.2 oz (74.027 kg)  08/08/15 162 lb (73.483 kg)   Room is dark, all blinds are closed, no lights are on. Chest clear. No JVD. S4 gallop on auscultation. Neuro exam reveals a flat affect and difficulty with engaging in conversation and making eye contact.  ASSESSMENT:  1. Multivessel coronary artery disease with surgical anatomy, excepted for surgical revascularization by Dr. Roxy Manns. 2. Left ventricular apical thrombus. Thrombus has been present since at least early May. She definitely had one month of anticoagulation therapy from May 5 through mid June. She was instructed at discharge to continue anticoagulation for 4 weeks which he did. She is subsequently been back on anticoagulation since this admission commencing on 10/08/15. No bleeding complications. 3. Tobacco abuse, one half packs per day 4. Ischemic cardiomyopathy with EF of 35%, and  chronic systolic heart failure without evidence of volume overload.  Plan:  1. Discharge today. 2. We need to ensure that case management or who ever is involved assists with application for Eliquis drug program for the needy. 3. Plan is for 2 consecutive  months of anticoagulation therapy prior to CABG. 4. Stressed smoking cessation. 5. Needs follow-up appointment set with Dr.  Roxy Manns in 4-6 weeks to begin planning CABG. 6. [facility] follow-up with Dr. Kirk Ruths.  Signed, Belva Crome III 10/14/2015, 9:18 AM

## 2015-10-14 NOTE — Telephone Encounter (Signed)
Received call from Nada Boozer, NP requesting Eliquis samples for pt.  Will place samples at front desk for pt to pick up. Nada Boozer will notify pt to stop by office to pick up. Eliquis 5 mg, 4 boxes of 14 tablets, Lot [id number], exp. Sept 2019 left at front desk.

## 2015-10-14 NOTE — Care Management Note (Signed)
Case Management Note Donn Pierini RN, BSN Unit 2W-Case Manager [phone]  Patient Details  Name: Casey [name] MRN: [id number] Date of Birth: January 10, 1971  Subjective/Objective:   Pt admitted with NSTEMI and +thrombus                 Action/Plan: PTA pt lived at home- independent- referral received for Eliquis- per conversation with pt she was discharged on last admission with Eliquis 30 day free card and used that to fill her prescription she has not filled out any further paperwork for further assistance with the drug company and will need to do so as she is uninsured and will not be able to afford the drug without further assistance- she will not be able to use another 30 day free card- pt assistance form given to pt to fill out- MD will need to fill out prescribers part- pt will need samples for discharge in order to bridge her until approved for assistance. She was seen at [facility] in f/u - will call to see if another appointment can be scheduled. CM to f/u prior to discharge.   Expected Discharge Date:     10/14/15             Expected Discharge Plan:  Home/Self Care  In-House Referral:     Discharge planning Services  CM Consult, Medication Assistance, Indigent Health Clinic, Follow-up appt scheduled  Post Acute Care Choice:    Choice offered to:     DME Arranged:    DME Agency:     HH Arranged:    HH Agency:     Status of Service:  Completed, signed off  If discussed at [employer], dates discussed:    Additional Comments:  10/14/15- 1100- Donn Pierini RN, BSN- pt for d/c later today- pt has filled out her part of Eliquis pt assistance form- spoke with Garen Lah with cardiology-who filled out the prescribers part of form- CM then faxed to the pt assistance fax # along with paper script for pt- pt given original form for reference. PA has arranged for pt to pick up a months worth of samples from the [facility] Office on [address]. Pt is aware of  this and will go there once discharged- CM has given CM contact # if she has any further questions about the Eliquis pt assistance process- have also advised her to f/u with the Cardiology office should she need any further assistance. Pt states that she should be able to get her other scripts from [facility] outpt pharmacy.   Darrold Span, RN 10/14/2015, 1:59 PM

## 2015-10-14 NOTE — Progress Notes (Signed)
Patient in stable condition, this RN went over patient education with patient, they verbalised understanding, pt belongings at bedside, iv removed, tele dc ccmd notified patient taken off the unit by a rn

## 2015-10-14 NOTE — Progress Notes (Signed)
CARDIAC REHAB PHASE I   Pt states she walked independently this morning, no complaints. Pt declines additional ambulation at this time. Pt states she has no questions regarding education at this time. Reinforced need for tobacco cessation, medication compliance. Pt verbalized understanding. Will plan to follow if pt returns for surgery. Cardiac rehab will sign off at this time.    Joylene Grapes, RN, BSN 10/14/2015 11:04 AM

## 2015-10-15 LAB — HEMOGLOBIN A1C
HEMOGLOBIN A1C: 4.9 % (ref 4.8–5.6)
MEAN PLASMA GLUCOSE: 94 mg/dL

## 2015-10-18 ENCOUNTER — Ambulatory Visit: Payer: Self-pay | Attending: Family Medicine | Admitting: Family Medicine

## 2015-10-18 ENCOUNTER — Encounter: Payer: Self-pay | Admitting: Family Medicine

## 2015-10-18 VITALS — BP 109/73 | HR 79 | Temp 97.9°F | Resp 16 | Ht 65.0 in | Wt 161.6 lb

## 2015-10-18 DIAGNOSIS — I255 Ischemic cardiomyopathy: Secondary | ICD-10-CM

## 2015-10-18 DIAGNOSIS — I513 Intracardiac thrombosis, not elsewhere classified: Secondary | ICD-10-CM

## 2015-10-18 DIAGNOSIS — Z720 Tobacco use: Secondary | ICD-10-CM

## 2015-10-18 DIAGNOSIS — I25110 Atherosclerotic heart disease of native coronary artery with unstable angina pectoris: Secondary | ICD-10-CM

## 2015-10-18 DIAGNOSIS — I213 ST elevation (STEMI) myocardial infarction of unspecified site: Secondary | ICD-10-CM

## 2015-10-18 MED ORDER — ATORVASTATIN CALCIUM 80 MG PO TABS: 80.0000 mg | ORAL_TABLET | Freq: Every day | ORAL | Status: DC

## 2015-10-18 MED ORDER — LISINOPRIL 2.5 MG PO TABS: 2.5000 mg | ORAL_TABLET | Freq: Every day | ORAL | Status: DC

## 2015-10-18 MED ORDER — METOPROLOL SUCCINATE ER 25 MG PO TB24: 25.0000 mg | ORAL_TABLET | Freq: Every day | ORAL | Status: DC

## 2015-10-18 MED ORDER — ASPIRIN 81 MG PO TBEC: 81.0000 mg | DELAYED_RELEASE_TABLET | Freq: Every day | ORAL | Status: DC

## 2015-10-18 MED ORDER — PANTOPRAZOLE SODIUM 40 MG PO TBEC: 40.0000 mg | DELAYED_RELEASE_TABLET | Freq: Every day | ORAL | Status: DC

## 2015-10-18 NOTE — Progress Notes (Signed)
Pt here for hospital F/U. Pt denies pain today. Pt has taken medications today.  

## 2015-10-18 NOTE — Patient Instructions (Signed)
Smoking Cessation, Tips for Success If you are ready to quit smoking, congratulations! You have chosen to help yourself be healthier. Cigarettes bring nicotine, tar, carbon monoxide, and other irritants into your body. Your lungs, heart, and blood vessels will be able to work better without these poisons. There are many different ways to quit smoking. Nicotine gum, nicotine patches, a nicotine inhaler, or nicotine nasal spray can help with physical craving. Hypnosis, support groups, and medicines help break the habit of smoking. WHAT THINGS CAN I DO TO MAKE QUITTING EASIER?  Here are some tips to help you quit for good:  Pick a date when you will quit smoking completely. Tell all of your friends and family about your plan to quit on that date.  Do not try to slowly cut down on the number of cigarettes you are smoking. Pick a quit date and quit smoking completely starting on that day.  Throw away all cigarettes.   Clean and remove all ashtrays from your home, work, and car.  On a card, write down your reasons for quitting. Carry the card with you and read it when you get the urge to smoke.  Cleanse your body of nicotine. Drink enough water and fluids to keep your urine clear or pale yellow. Do this after quitting to flush the nicotine from your body.  Learn to predict your moods. Do not let a bad situation be your excuse to have a cigarette. Some situations in your life might tempt you into wanting a cigarette.  Never have "just one" cigarette. It leads to wanting another and another. Remind yourself of your decision to quit.  Change habits associated with smoking. If you smoked while driving or when feeling stressed, try other activities to replace smoking. Stand up when drinking your coffee. Brush your teeth after eating. Sit in a different chair when you read the paper. Avoid alcohol while trying to quit, and try to drink fewer caffeinated beverages. Alcohol and caffeine may urge you to  smoke.  Avoid foods and drinks that can trigger a desire to smoke, such as sugary or spicy foods and alcohol.  Ask people who smoke not to smoke around you.  Have something planned to do right after eating or having a cup of coffee. For example, plan to take a walk or exercise.  Try a relaxation exercise to calm you down and decrease your stress. Remember, you may be tense and nervous for the first 2 weeks after you quit, but this will pass.  Find new activities to keep your hands busy. Play with a pen, coin, or rubber band. Doodle or draw things on paper.  Brush your teeth right after eating. This will help cut down on the craving for the taste of tobacco after meals. You can also try mouthwash.   Use oral substitutes in place of cigarettes. Try using lemon drops, carrots, cinnamon sticks, or chewing gum. Keep them handy so they are available when you have the urge to smoke.  When you have the urge to smoke, try deep breathing.  Designate your home as a nonsmoking area.  If you are a heavy smoker, ask your health care provider about a prescription for nicotine chewing gum. It can ease your withdrawal from nicotine.  Reward yourself. Set aside the cigarette money you save and buy yourself something nice.  Look for support from others. Join a support group or smoking cessation program. Ask someone at home or at work to help you with your plan   to quit smoking.  Always ask yourself, "Do I need this cigarette or is this just a reflex?" Tell yourself, "Today, I choose not to smoke," or "I do not want to smoke." You are reminding yourself of your decision to quit.  Do not replace cigarette smoking with electronic cigarettes (commonly called e-cigarettes). The safety of e-cigarettes is unknown, and some may contain harmful chemicals.  If you relapse, do not give up! Plan ahead and think about what you will do the next time you get the urge to smoke. HOW WILL I FEEL WHEN I QUIT SMOKING? You  may have symptoms of withdrawal because your body is used to nicotine (the addictive substance in cigarettes). You may crave cigarettes, be irritable, feel very hungry, cough often, get headaches, or have difficulty concentrating. The withdrawal symptoms are only temporary. They are strongest when you first quit but will go away within 10-14 days. When withdrawal symptoms occur, stay in control. Think about your reasons for quitting. Remind yourself that these are signs that your body is healing and getting used to being without cigarettes. Remember that withdrawal symptoms are easier to treat than the major diseases that smoking can cause.  Even after the withdrawal is over, expect periodic urges to smoke. However, these cravings are generally short lived and will go away whether you smoke or not. Do not smoke! WHAT RESOURCES ARE AVAILABLE TO HELP ME QUIT SMOKING? Your health care provider can direct you to community resources or hospitals for support, which may include:  Group support.  Education.  Hypnosis.  Therapy.   This information is not intended to replace advice given to you by your health care provider. Make sure you discuss any questions you have with your health care provider.   Document Released: 12/16/2003 Document Revised: 04/09/2014 Document Reviewed: 09/04/2012 Elsevier Interactive Patient Education 2016 [employer]  

## 2015-10-18 NOTE — Progress Notes (Signed)
Subjective:  Patient ID: Casey [name], female    DOB: Sep 02, 1970  Age: 45 y.o. MRN: [id number]  CC: Follow-up   HPI Casey [name] is a 45 year old female with a history of tobacco abuse, ischemic cardiomyopathy (EF 35-40%), coronary artery disease, left ventricular thrombus hospitalized at [facility] from 10/08/15 through 10/14/15 after presentation with nausea, vomiting and epigastric discomfort and was found to have elevated troponins and EKG revealing new inferior T wave inversion.  Left heart cath revealed : Moderately severe LV dysfunction with an ejection fraction of 30-35% with hypokinesis of the anterior to anterolateral wall with akinesis to dyskinesis in the distal anterior apical and apical segment with suggestion of a large, smooth bordered, probable mural thrombus apically extending to the distal anterolateral wall and focal basal inferior hypocontractility. Previously documented dissection of the proximal LAD with improved filling and residual narrowing of 50-55%; 70% smooth eccentric proximal ramus stenosis, mild 20% left circumflex stenosis, and new total occlusion of the RCA at its origin with extensive left to right collaterals supplying almost the entire RCA territory distally to near [place].  Abdominal imaging revealed normal gallbladder, hepatic steatosis. She was commenced on aspirin and Eliquis; seen by Carfiothoracic surgery with a plan for anticoagulation for 2 months prior to CABG; recommendation was for long-term anticoagulation.  She denies chest pains, epigastric discomfort at this time and has been working on smoking cessation; last smoked 3 days ago.  Outpatient Prescriptions Prior to Visit  Medication Sig Dispense Refill  . apixaban (ELIQUIS) 5 MG TABS tablet Take 1 tablet (5 mg total) by mouth 2 (two) times daily. 60 tablet 11  . atorvastatin (LIPITOR) 80 MG tablet Take 1 tablet (80 mg total) by mouth daily. 30 tablet 6  . lisinopril (PRINIVIL,ZESTRIL)  2.5 MG tablet Take 1 tablet (2.5 mg total) by mouth daily. 30 tablet 6  . metoprolol succinate (TOPROL-XL) 25 MG 24 hr tablet Take 1 tablet (25 mg total) by mouth daily. 30 tablet 6  . pantoprazole (PROTONIX) 40 MG tablet Take 1 tablet (40 mg total) by mouth daily at 6 (six) AM. 30 tablet 2  . acetaminophen (TYLENOL) 325 MG tablet Take 2 tablets (650 mg total) by mouth every 4 (four) hours as needed for headache or mild pain. (Patient not taking: Reported on 10/18/2015)    . nicotine (NICODERM CQ - DOSED IN MG/24 HOURS) 14 mg/24hr patch Place 1 patch (14 mg total) onto the skin daily. (Patient not taking: Reported on 10/18/2015) 28 patch 1  . nitroGLYCERIN (NITROSTAT) 0.4 MG SL tablet Place 1 tablet (0.4 mg total) under the tongue every 5 (five) minutes x 3 doses as needed for chest pain. (Patient not taking: Reported on 10/18/2015) 25 tablet 4  . aspirin EC 81 MG EC tablet Take 1 tablet (81 mg total) by mouth daily. (Patient not taking: Reported on 10/18/2015)     [name] facility-administered medications prior to visit.    ROS Review of Systems  Constitutional: Negative for activity change, appetite change and fatigue.  HENT: Negative for congestion, sinus pressure and sore throat.   Eyes: Negative for visual disturbance.  Respiratory: Negative for cough, chest tightness, shortness of breath and wheezing.   Cardiovascular: Negative for chest pain and palpitations.  Gastrointestinal: Negative for abdominal pain, constipation and abdominal distention.  Endocrine: Negative for polydipsia.  Genitourinary: Negative for dysuria and frequency.  Musculoskeletal: Negative for back pain and arthralgias.  Skin: Negative for rash.  Neurological: Negative for tremors, light-headedness and numbness.  Hematological: Does not bruise/bleed easily.  Psychiatric/Behavioral: Negative for behavioral problems and agitation.    Objective:  BP 109/73 mmHg  Pulse 79  Temp(Src) 97.9 F (36.6 C) (Oral)  Resp 16  Ht   (1.651 m)  Wt 161 lb 9.6 oz (73.301 kg)  BMI 26.89 kg/m2  SpO2 98%  LMP  (LMP Unknown)  BP/Weight 10/18/2015 10/14/2015 08/10/2015  Systolic BP 109 121 104  Diastolic BP 73 62 68  Wt. (Lbs) 161.6 168.1 163.2  BMI 26.89 27.97 27.16      Physical Exam  Constitutional: She is oriented to person, place, and time. She appears well-developed and well-nourished.  Cardiovascular: Normal rate and normal heart sounds.   [name] murmur heard. Inability to palpate dorsalis pedis bilaterally  Pulmonary/Chest: Effort normal and breath sounds normal. She has [name] wheezes. She has [name] rales. She exhibits [name] tenderness.  Abdominal: Soft. Bowel sounds are normal. She exhibits [name] distension and [name] mass. There is [name] tenderness.  Musculoskeletal: Normal range of motion.  Neurological: She is alert and oriented to person, place, and time.  Skin: Skin is warm and dry.  Psychiatric: She has a normal mood and affect.     Assessment & Plan:   1. LV (left ventricular) mural thrombus (HCC) Continue  Eliquis She informs me she has been approved for medication assistance by the company - aspirin 81 MG EC tablet; Take 1 tablet (81 mg total) by mouth daily.  Dispense: 30 tablet; Refill: 3  2. Cardiomyopathy, ischemic EF 35-40% - lisinopril (PRINIVIL,ZESTRIL) 2.5 MG tablet; Take 1 tablet (2.5 mg total) by mouth daily.  Dispense: 30 tablet; Refill: 3 - metoprolol succinate (TOPROL-XL) 25 MG 24 hr tablet; Take 1 tablet (25 mg total) by mouth daily.  Dispense: 30 tablet; Refill: 3  3. Atherosclerosis of native coronary artery of native heart with unstable angina pectoris [facility]) Plans for CABG after 2 months of anticoagulation with Eliquis Chief upcoming appointment with cardiology, cardiac surgery. - atorvastatin (LIPITOR) 80 MG tablet; Take 1 tablet (80 mg total) by mouth daily.  Dispense: 30 tablet; Refill: 3   Meds ordered this encounter  Medications  . aspirin 81 MG EC tablet    Sig: Take 1 tablet (81 mg  total) by mouth daily.    Dispense:  30 tablet    Refill:  3  . atorvastatin (LIPITOR) 80 MG tablet    Sig: Take 1 tablet (80 mg total) by mouth daily.    Dispense:  30 tablet    Refill:  3  . lisinopril (PRINIVIL,ZESTRIL) 2.5 MG tablet    Sig: Take 1 tablet (2.5 mg total) by mouth daily.    Dispense:  30 tablet    Refill:  3  . metoprolol succinate (TOPROL-XL) 25 MG 24 hr tablet    Sig: Take 1 tablet (25 mg total) by mouth daily.    Dispense:  30 tablet    Refill:  3  . pantoprazole (PROTONIX) 40 MG tablet    Sig: Take 1 tablet (40 mg total) by mouth daily at 6 (six) AM.    Dispense:  30 tablet    Refill:  3    Follow-up: Return in about 2 months (around 12/19/2015) for Follow-up on cardiomyopathy.   Jaclyn Shaggy MD

## 2015-10-24 ENCOUNTER — Encounter: Payer: Self-pay | Admitting: Nurse Practitioner

## 2015-10-24 ENCOUNTER — Ambulatory Visit (INDEPENDENT_AMBULATORY_CARE_PROVIDER_SITE_OTHER): Payer: Self-pay | Admitting: Nurse Practitioner

## 2015-10-24 VITALS — BP 120/80 | HR 101 | Ht 65.0 in | Wt 160.8 lb

## 2015-10-24 DIAGNOSIS — I214 Non-ST elevation (NSTEMI) myocardial infarction: Secondary | ICD-10-CM

## 2015-10-24 DIAGNOSIS — I213 ST elevation (STEMI) myocardial infarction of unspecified site: Secondary | ICD-10-CM

## 2015-10-24 DIAGNOSIS — I2510 Atherosclerotic heart disease of native coronary artery without angina pectoris: Secondary | ICD-10-CM

## 2015-10-24 DIAGNOSIS — I513 Intracardiac thrombosis, not elsewhere classified: Secondary | ICD-10-CM

## 2015-10-24 DIAGNOSIS — Z720 Tobacco use: Secondary | ICD-10-CM

## 2015-10-24 LAB — URINALYSIS
Bilirubin Urine: NEGATIVE
Glucose, UA: NEGATIVE
Hgb urine dipstick: NEGATIVE
Ketones, ur: NEGATIVE
Leukocytes, UA: NEGATIVE
Nitrite: NEGATIVE
Protein, ur: NEGATIVE
Specific Gravity, Urine: 1.019 (ref 1.001–1.035)
pH: 5.5 (ref 5.0–8.0)

## 2015-10-24 LAB — BASIC METABOLIC PANEL
BUN: 6 mg/dL — ABNORMAL LOW (ref 7–25)
CO2: 23 mmol/L (ref 20–31)
Calcium: 9.2 mg/dL (ref 8.6–10.2)
Chloride: 106 mmol/L (ref 98–110)
Creat: 0.61 mg/dL (ref 0.50–1.10)
Glucose, Bld: 77 mg/dL (ref 65–99)
Potassium: 4.4 mmol/L (ref 3.5–5.3)
Sodium: 135 mmol/L (ref 135–146)

## 2015-10-24 LAB — CBC
HCT: 36.4 % (ref 35.0–45.0)
Hemoglobin: 12.1 g/dL (ref 11.7–15.5)
MCH: 31.6 pg (ref 27.0–33.0)
MCHC: 33.2 g/dL (ref 32.0–36.0)
MCV: 95 fL (ref 80.0–100.0)
MPV: 8.9 fL (ref 7.5–12.5)
Platelets: 468 10*3/uL — ABNORMAL HIGH (ref 140–400)
RBC: 3.83 MIL/uL (ref 3.80–5.10)
RDW: 17 % — ABNORMAL HIGH (ref 11.0–15.0)
WBC: 9.5 10*3/uL (ref 3.8–10.8)

## 2015-10-24 MED ORDER — METOPROLOL SUCCINATE ER 50 MG PO TB24: 50.0000 mg | ORAL_TABLET | Freq: Every day | ORAL | 3 refills | Status: DC

## 2015-10-24 NOTE — Patient Instructions (Addendum)
We will be checking the following labs today - BMET, CBC and urinalysis  Medication Instructions:    Continue with your current medicines. BUT  I am increasing the Toprol to 50 mg a day - take 2 of your 25 mg tablets to use up - I have sent in the RX for the 50 mg to your pharmacy    Testing/Procedures To Be Arranged:  N/A  Follow-Up:  See Dr. Jens Som as planned    Other Special Instructions:   Congrats for not smoking.     If you need a refill on your cardiac medications before your next appointment, please call your pharmacy.   Call the [facility] office at [phone] if you have any questions, problems or concerns.

## 2015-10-24 NOTE — Progress Notes (Signed)
CARDIOLOGY OFFICE NOTE  Date:  10/24/2015    Casey [name] Date of Birth: January 18, 1971 Medical Record #[id number]  PCP:  Arnoldo Morale, MD  Cardiologist:   Stanford Breed  Chief Complaint  Patient presents with  . Coronary Artery Disease    TOC - seen for Dr. Stanford Breed    History of Present Illness: Casey [name] is a 45 y.o. female who presents today for a TOC visit/post hospital. However there is no phone call documented. Seen for Dr. Stanford Breed.   She has a history of CAD, currently not revascularized, EF 35-40% / ischemic cardiomyopathy, LV thrombus. Cardiac catheterization May 2017 showed a 90% proximal to mid LAD (possible dissection), 60% ramus and 50% right coronary artery. At that time she presented with abdominal discomfort and nausea and given her akinetic anterior wall, it was thought that she had likely completed an anterolateral infarct and medical management was started.   She followed in clinic with Dr. Stanford Breed and was off all medications. She recalled being told that she only needed one month of anticoagulation and no aspirin therapy although it was noted she that she should at least be on anticoagulation.   Readmitted earlier this month - had experienced nausea/vommiting and epigastric discomfort. This was similar to the symptoms that led her to the hospitalization in May. She has never had chest pain.   In ED: ECG showing new inferior TWI, stable appearance to anterolateral QW. ISTAT troponin elevated to 0.61, Troponin I 1.2. RUQ checked and wnl. Started on heparin ACS protocol and given 325 ASA.   She was admitted on meds as above.  Troponin peaked at 1.17 then down to 0.91.  Cardiac cath was planned with results as below.   She was followed and continued with some abd. pain.  An abd film with normal gallbladder, increased liver echogenicity commonly represents hepatic steatosis.  r. Tamala Julian felt this was costochondritis.   Pt had MRI  viability study with recent MI.  She has has extensive viable myocardium in both the high anterior wall and the inferior wall. 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   Pt was restarted on Lisinopril and metoprolol along with Eliquis and ASA.     With results of MRI see below-- Dr. Tamala Julian " discussed this patient with interventional colleagues concerning an interventional approach such as CT oh intervention on the RCA and attempted stenting of the LAD and ramus intermedius. This is possible although would be of uncertain outcome because of evidence of dissection in the LAD.  Therefore will also ask for a surgical consultation to contemplate multivessel revascularization within the next one to 3 months as we allow the apical thrombus to organize."  She met with Dr. Roxy Manns 10/13/15 and 10/14/15.  Dr. Roxy Manns felt she would do best with CABG.  She is willing to undergo coronary bypass grafting. Dr. Roxy Manns has suggested additional anticoagulation therapy to insure organization of the LV thrombus. The consensus opinion is that we should delay CABG until LV mural thrombus has resolved.  She will follow up in our office in 7-10 days TOC.     Plan is for 2 consecutive months of anticoagulation therapy prior to CABG.  Comes back today. Here alone. Not very talkative. Looking at her phone the entire visit. Says nothing is wrong with her. She denies chest pain, abdominal pain or nausea. Keeps saying "I'm fine". Says she is  not smoking. Says she is taking her medicines and continues to reiterate "I'm fine".   Past Medical History:  Diagnosis Date  . Anticoagulation adequate 10/14/2015  . CAD (coronary artery disease)   . Coronary atherosclerosis of native coronary artery, multivessel with plans for CABG to be followed   08/04/2015  . Ischemic cardiomyopathy   . UTI (urinary tract infection) 10/14/2015    Past Surgical History:    Procedure Laterality Date  . CARDIAC CATHETERIZATION N/A 08/04/2015   Procedure: Coronary/Graft Angiography;  Surgeon: Peter M Martinique, MD;  Location: [facility];  Service: Cardiovascular;  Laterality: N/A;  . CARDIAC CATHETERIZATION N/A 10/10/2015   Procedure: Left Heart Cath and Coronary Angiography;  Surgeon: Troy Sine, MD;  Location: [facility];  Service: Cardiovascular;  Laterality: N/A;  . CESAREAN SECTION       Medications: Current Outpatient Prescriptions  Medication Sig Dispense Refill  . acetaminophen (TYLENOL) 325 MG tablet Take 2 tablets (650 mg total) by mouth every 4 (four) hours as needed for headache or mild pain.    Marland Kitchen apixaban (ELIQUIS) 5 MG TABS tablet Take 1 tablet (5 mg total) by mouth 2 (two) times daily. 60 tablet 11  . atorvastatin (LIPITOR) 80 MG tablet Take 1 tablet (80 mg total) by mouth daily. 30 tablet 3  . lisinopril (PRINIVIL,ZESTRIL) 2.5 MG tablet Take 1 tablet (2.5 mg total) by mouth daily. 30 tablet 3  . metoprolol succinate (TOPROL-XL) 50 MG 24 hr tablet Take 1 tablet (50 mg total) by mouth daily. Take with or immediately following a meal. 90 tablet 3   No current facility-administered medications for this visit.     Allergies: No Known Allergies  Social History: The patient  reports that she has been smoking Cigarettes.  She has been smoking about 0.50 packs per day. She has never used smokeless tobacco. She reports that she does not drink alcohol or use drugs.   Family History: The patient's family history is not on file.   Review of Systems: Please see the history of present illness.   Otherwise, the review of systems is positive for none.   All other systems are reviewed and negative.   Physical Exam: VS:  BP 120/80   Pulse (!) 101   Ht '5\' 5"'$  (1.651 m)   Wt 160 lb 12.8 oz (72.9 kg)   LMP  (LMP Unknown)   BMI 26.76 kg/m  .  BMI Body mass index is 26.76 kg/m.  Wt Readings from Last 3 Encounters:  10/24/15 160 lb 12.8  oz (72.9 kg)  10/18/15 161 lb 9.6 oz (73.3 kg)  10/14/15 168 lb 1.6 oz (76.2 kg)    General: Affect pretty flat. She is alert and in no acute distress. She smells of tobacco.   HEENT: Normal.  Neck: Supple, no JVD, carotid bruits, or masses noted.  Cardiac: Regular rate and rhythm but HR is fast.  No edema.  Respiratory:  Lungs are clear to auscultation bilaterally with normal work of breathing.  GI: Soft and nontender.  MS: No deformity or atrophy. Gait and ROM intact.  Skin: Warm and dry. Color is normal.  Neuro:  Strength and sensation are intact and no gross focal deficits noted.  Psych: Alert, appropriate and with normal affect.   LABORATORY DATA:  EKG:  EKG is ordered today. This demonstrates sinus tachycardia - poor R wave progression, lateral Q's.  Lab Results  Component Value Date   WBC 7.4 10/12/2015  HGB 10.5 (L) 10/12/2015   HCT 31.7 (L) 10/12/2015   PLT 284 10/12/2015   GLUCOSE 96 10/13/2015   CHOL 113 10/14/2015   TRIG 67 10/14/2015   HDL 43 10/14/2015   LDLCALC 57 10/14/2015   ALT 50 10/08/2015   AST 66 (H) 10/08/2015   NA 137 10/13/2015   K 4.4 10/13/2015   CL 109 10/13/2015   CREATININE 0.51 10/13/2015   BUN <5 (L) 10/13/2015   CO2 25 10/13/2015   INR 1.10 10/09/2015   HGBA1C 4.9 10/14/2015    BNP (last 3 results)  Recent Labs  10/08/15 2133  BNP 106.4*    ProBNP (last 3 results) No results for input(s): PROBNP in the last 8760 hours.   Other Studies Reviewed Today: Procedures: 10/10/15  Cardiac cath by Dr. Claiborne Billings     Procedures    Left Heart Cath and Coronary Angiography    Conclusion     There is moderate to severe left ventricular systolic dysfunction.  Ost Ramus to Ramus lesion, 70% stenosed.  Ost LAD to Prox LAD lesion, 50% stenosed.  Prox LAD to Mid LAD lesion, 55% stenosed.  Prox Cx lesion, 20% stenosed.  Ost RCA lesion, 100% stenosed.  Moderately severe LV dysfunction with an ejection fraction of 30-35% with  hypokinesis of the anterior to anterolateral wall with akinesis to dyskinesis in the distal anterior apical and apical segment with suggestion of a large, smooth bordered, probable mural thrombus apically extending to the distal anterolateral wall and focal basal inferior hypocontractility.  Previously documented dissection of the proximal LAD with improved filling and residual narrowing of 50-55%; 70% smooth eccentric proximal ramus stenosis, mild 20% left circumflex stenosis, and new total occlusion of the RCA at its origin with extensive left to right collaterals supplying almost the entire RCA territory distally to near [place].  Recommendation: Angiograms will be reviewed with colleagues. Since her catheterization of 08/04/2015, the RCA is now occluded [facility] which she has extensively collateralized via left injection. She continues to have residual LAD proximal dissection, but there is TIMI-3 flow. Smoking cessation is essential. It appears that she has an extensive left ventricular mural thrombus. An echocardiogram was ordered. She will need to be on anticoagulation long-term. In addition, she has reduced right femoral pulse and duplex imaging of her right lower extremity is recommended. Recommended.     ECHO:  ------------------------------------------------------------------- LV EF: 30% - 35%  ------------------------------------------------------------------- Indications: Abnormal EKG 794.31.  ------------------------------------------------------------------- History: PMH: LV thrombus noted on echo performed 08-02-2015. Ischemic cardiomyopathy. PMH: Myocardial infarction. Risk factors: Current tobacco use.  ------------------------------------------------------------------- Study Conclusions  - Left ventricle: The cavity size was moderately dilated. Wall  thickness was increased in a pattern of mild LVH. There was focal  basal hypertrophy.  Systolic function was moderately to severely  reduced. The estimated ejection fraction was in the range of 30%  to 35%. Dyskinesis of the apicalanteroseptal, lateral, and apical  myocardium. Features are consistent with a pseudonormal left  ventricular filling pattern, with concomitant abnormal relaxation  and increased filling pressure (grade 2 diastolic dysfunction).  There was a large, 3.5 cm (L) x 2.2 cm (W)thrombus. - Mitral valve: There was mild regurgitation. - Left atrium: The atrium was mildly dilated.  Urgent and Critical Findings: A critical finding, .    Assessment/Plan: 1. NSTEMI - as outlined above with plans for surgical intervention after approximate 2 months of anticoagulation. She appears to be without symptoms.   2. LV thrombus - on Eliquis   3.  Ischemic CM - appears to be without symptoms. HR is fast. Will try to increase her Toprol to 50 mg a day.   4. Tobacco abuse - she says she has stopped. Hopefully she will be able to abstain going forward.   5. Possible UTI - rechecking UA today.   6. Anemia - recheck CBC today  7. Chronic anticoagulation - on Eliquis - does not appear to have any issue at this time.   Current medicines are reviewed with the patient today.  The patient does not have concerns regarding medicines other than what has been noted above.  The following changes have been made:  See above.  Labs/ tests ordered today include:    Orders Placed This Encounter  Procedures  . Basic metabolic panel  . CBC  . Urinalysis  . EKG 12-Lead     Disposition:   FU with Dr. Stanford Breed and his team in 2 weeks.   Patient is agreeable to this plan and will call if any problems develop in the interim.   Signed: Burtis Junes, RN, ANP-C 10/24/2015 8:57 AM  [facility] [address] [place], [place]  [address] Phone: [phone] Fax: [phone]

## 2015-10-26 ENCOUNTER — Other Ambulatory Visit: Payer: Self-pay | Admitting: *Deleted

## 2015-10-26 MED ORDER — METOPROLOL SUCCINATE ER 25 MG PO TB24: 25.0000 mg | ORAL_TABLET | Freq: Every day | ORAL | 2 refills | Status: DC

## 2015-11-01 ENCOUNTER — Other Ambulatory Visit: Payer: Self-pay | Admitting: *Deleted

## 2015-11-01 MED ORDER — METOPROLOL SUCCINATE ER 25 MG PO TB24: ORAL_TABLET | ORAL | 9 refills | Status: DC

## 2015-11-01 MED ORDER — METOPROLOL SUCCINATE ER 50 MG PO TB24: ORAL_TABLET | ORAL | 3 refills | Status: DC

## 2015-11-01 MED FILL — METOPROLOL SUCC ER 50 MG TA: 50 | 30 days supply | Qty: 30 | Fill #0

## 2015-11-01 NOTE — Telephone Encounter (Signed)
Sent in to pharmacy toprol 50 XL to pt's pharmacy.  Casey [name] just stepped back in and discussed this pt. At office visit pt was not taking medication at all .  So pt is to be on (25 mg ) daily Toprol XL.

## 2015-11-01 NOTE — Telephone Encounter (Signed)
[facility] in [facility].  I resent in metoprolol (50 mg ) bid today.

## 2015-11-02 NOTE — Progress Notes (Signed)
HPI: FU coronary artery disease. Patient previously admitted with abdominal pain. Abdominal CT showed thrombus at the LV apex. Echocardiogram May 2017 showed ejection fraction 35-40% with anterior and apical wall motion abnormality. Grade 2 diastolic dysfunction. Thrombus at the apex. Mild mitral regurgitation and mild left atrial enlargement. Cardiac catheterization May 2017 showed a 90% proximal to mid LAD, 60% ramus and 50% right coronary artery. It was felt that she had likely infarcted the LAD territory and medical therapy recommended unless she has recurrent symptoms. She was placed on apixaban. Readmitted July 2017 and cardiac cath showed a 70% ramus, 50% followed by 55% LAD and occluded right coronary artery. Ejection 30-35%.  Apical thrombus noted. Echocardiogram July 2017 showed ejection fraction 30-35% and apical thrombus as well as mild mitral regurgitation. Cardiac MRI July 2017 showed ejection fraction 35%, apical thrombus and viability in the basal inferior, mid anterior walls. Patient seen by Dr. Cornelius Moras and plan ultimately is for  coronary artery bypass graft after several months of anticoagulation. There has been issues with compliance. Since last seen, the patient denies any dyspnea on exertion, orthopnea, PND, pedal edema, palpitations, syncope or chest pain.   Current Outpatient Prescriptions  Medication Sig Dispense Refill  . acetaminophen (TYLENOL) 325 MG tablet Take 2 tablets (650 mg total) by mouth every 4 (four) hours as needed for headache or mild pain.    Marland Kitchen apixaban (ELIQUIS) 5 MG TABS tablet Take 1 tablet (5 mg total) by mouth 2 (two) times daily. 60 tablet 11  . atorvastatin (LIPITOR) 80 MG tablet Take 1 tablet (80 mg total) by mouth daily. 30 tablet 3  . lisinopril (PRINIVIL,ZESTRIL) 2.5 MG tablet Take 1 tablet (2.5 mg total) by mouth daily. 30 tablet 3  . metoprolol succinate (TOPROL-XL) 25 MG 24 hr tablet Take with or immediately following a meal. 30 tablet 9  .  pantoprazole (PROTONIX) 40 MG tablet Take 40 mg by mouth daily.     No current facility-administered medications for this visit.      Past Medical History:  Diagnosis Date  . Anticoagulation adequate 10/14/2015  . CAD (coronary artery disease)   . Coronary atherosclerosis of native coronary artery, multivessel with plans for CABG to be followed   08/04/2015  . Ischemic cardiomyopathy   . UTI (urinary tract infection) 10/14/2015    Past Surgical History:  Procedure Laterality Date  . CARDIAC CATHETERIZATION N/A 08/04/2015   Procedure: Coronary/Graft Angiography;  Surgeon: Peter M Swaziland, MD;  Location: [facility] INVASIVE CV LAB;  Service: Cardiovascular;  Laterality: N/A;  . CARDIAC CATHETERIZATION N/A 10/10/2015   Procedure: Left Heart Cath and Coronary Angiography;  Surgeon: Lennette Bihari, MD;  Location: [facility] INVASIVE CV LAB;  Service: Cardiovascular;  Laterality: N/A;  . CESAREAN SECTION      Social History   Social History  . Marital status: Single    Spouse name: N/A  . Number of children: N/A  . Years of education: N/A   Occupational History  . Not on file.   Social History Main Topics  . Smoking status: Current Some Day Smoker    Packs/day: 0.50    Types: Cigarettes  . Smokeless tobacco: Never Used  . Alcohol use No     Comment: Two drinks a night.  . Drug use: No     Comment: No history of cocaine use  . Sexual activity: Not on file   Other Topics Concern  . Not on file   Social History Narrative  .  No narrative on file    Family History  Problem Relation Age of Onset  . Diabetes Mellitus II Neg Hx   . CAD Neg Hx     ROS: no fevers or chills, productive cough, hemoptysis, dysphasia, odynophagia, melena, hematochezia, dysuria, hematuria, rash, seizure activity, orthopnea, PND, pedal edema, claudication. Remaining systems are negative.  Physical Exam: Well-developed well-nourished in no acute distress.  Skin is warm and dry.  HEENT is normal.  Neck is supple.    Chest is clear to auscultation with normal expansion.  Cardiovascular exam is regular rate and rhythm.  Abdominal exam nontender or distended. No masses palpated. Extremities show no edema. neuro grossly intact  A/P  1 Coronary artery disease-Continue statin. Not on aspirin given need for anticoagulation. Patient will see Dr. Cornelius Moras next week with final opinion concerning proceeding with coronary artery bypass and graft and timing.  2 ischemic cardiomyopathy-continue ACE inhibitor and beta blocker.  3 LV apical thrombus-continue apixaban. I again explained the importance of compliance with medical therapy.  4 tobacco abuse-patient has discontinued.  Olga Millers MD

## 2015-11-04 ENCOUNTER — Telehealth: Payer: Self-pay

## 2015-11-04 NOTE — Telephone Encounter (Signed)
Patient has been accepted into Assistance program for Eliquis. Good through 10/13/2016.

## 2015-11-08 ENCOUNTER — Other Ambulatory Visit (HOSPITAL_COMMUNITY): Payer: Self-pay

## 2015-11-10 ENCOUNTER — Other Ambulatory Visit: Payer: Self-pay | Admitting: Thoracic Surgery (Cardiothoracic Vascular Surgery)

## 2015-11-10 DIAGNOSIS — I252 Old myocardial infarction: Secondary | ICD-10-CM

## 2015-11-11 ENCOUNTER — Ambulatory Visit (INDEPENDENT_AMBULATORY_CARE_PROVIDER_SITE_OTHER): Payer: Self-pay | Admitting: Cardiology

## 2015-11-11 ENCOUNTER — Encounter: Payer: Self-pay | Admitting: Cardiology

## 2015-11-11 VITALS — BP 110/64 | HR 89 | Ht 65.0 in | Wt 162.0 lb

## 2015-11-11 DIAGNOSIS — I213 ST elevation (STEMI) myocardial infarction of unspecified site: Secondary | ICD-10-CM

## 2015-11-11 DIAGNOSIS — I2510 Atherosclerotic heart disease of native coronary artery without angina pectoris: Secondary | ICD-10-CM

## 2015-11-11 DIAGNOSIS — I255 Ischemic cardiomyopathy: Secondary | ICD-10-CM

## 2015-11-11 DIAGNOSIS — I513 Intracardiac thrombosis, not elsewhere classified: Secondary | ICD-10-CM

## 2015-11-11 NOTE — Patient Instructions (Signed)
Medication Instructions:  Your physician recommends that you continue on your current medications as directed. Please refer to the Current Medication list given to you today.   Labwork: -None  Testing/Procedures: -None  Follow-Up: Your physician recommends that you keep your scheduled  follow-up appointment in 3 months with Dr. Jens Som   Any Other Special Instructions Will Be Listed Below (If Applicable).     If you need a refill on your cardiac medications before your next appointment, please call your pharmacy.

## 2015-11-14 ENCOUNTER — Other Ambulatory Visit: Payer: Self-pay | Admitting: *Deleted

## 2015-11-14 ENCOUNTER — Ambulatory Visit
Admission: RE | Admit: 2015-11-14 | Discharge: 2015-11-14 | Disposition: A | Discharge status: Home / Self Care | Payer: No Typology Code available for payment source | Source: Ambulatory Visit | Attending: Thoracic Surgery (Cardiothoracic Vascular Surgery) | Admitting: Thoracic Surgery (Cardiothoracic Vascular Surgery)

## 2015-11-14 ENCOUNTER — Ambulatory Visit (INDEPENDENT_AMBULATORY_CARE_PROVIDER_SITE_OTHER): Payer: Self-pay | Admitting: Thoracic Surgery (Cardiothoracic Vascular Surgery)

## 2015-11-14 ENCOUNTER — Encounter: Payer: Self-pay | Admitting: Thoracic Surgery (Cardiothoracic Vascular Surgery)

## 2015-11-14 VITALS — BP 130/80 | HR 80 | Resp 16 | Ht 65.0 in | Wt 162.0 lb

## 2015-11-14 DIAGNOSIS — I252 Old myocardial infarction: Secondary | ICD-10-CM

## 2015-11-14 DIAGNOSIS — I213 ST elevation (STEMI) myocardial infarction of unspecified site: Secondary | ICD-10-CM

## 2015-11-14 DIAGNOSIS — I214 Non-ST elevation (NSTEMI) myocardial infarction: Secondary | ICD-10-CM

## 2015-11-14 DIAGNOSIS — I255 Ischemic cardiomyopathy: Secondary | ICD-10-CM

## 2015-11-14 DIAGNOSIS — I2510 Atherosclerotic heart disease of native coronary artery without angina pectoris: Secondary | ICD-10-CM

## 2015-11-14 DIAGNOSIS — I513 Intracardiac thrombosis, not elsewhere classified: Secondary | ICD-10-CM

## 2015-11-14 DIAGNOSIS — I200 Unstable angina: Secondary | ICD-10-CM

## 2015-11-14 MED FILL — LISINOPRIL 2.5 MG TABLET: 2.5 | 30 days supply | Qty: 30 | Fill #0

## 2015-11-14 MED FILL — ATORVASTATIN 80 MG TABLET: 80 | 30 days supply | Qty: 30 | Fill #0

## 2015-11-14 MED FILL — ?PANTOPRAZOLE SOD DR 40MG: 40 MG | 30 days supply | Qty: 30 | Fill #0

## 2015-11-14 NOTE — Progress Notes (Signed)
[address]       [place] [id number]             [phone]     [facility] OFFICE NOTE  Referring Provider is Lyn RecordsSMITH, HENRY W, MD Primary Cardiologist is CRENSHAW, Madolyn FriezeBRIAN S, MD PCP is Jaclyn ShaggyEnobong, Amao, MD   HPI:  Patient is a 45 year old African-American female with multiple medical problems who returns to the office for follow-up today related to severe multivessel coronary artery disease with ischemic cardiomyopathy associated with left ventricular mural thrombus. The patient originally presented on 08/01/2015 with primary complaint of epigastric abdominal pain associated with nausea and vomiting.  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 Transthoracic echocardiogram revealed diffuse global left ventricular systolic dysfunction with ejection fraction estimated 35-40%. There was a large mural thrombus involving the distal anterior wall and apex of the left ventricle. Diagnostic cardiac catheterization revealed diffuse two-vessel coronary artery disease with 90% stenosis of the left anterior descending coronary artery. 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 She was treated medically and started on Eliquis for anticoagulation.  The patient stopped taking all of her medications in early June. She was readmitted to the hospital 10/08/2015 with symptoms of nausea, vomiting, and abdominal discomfort similar to that which she experienced in May. She ruled in for an acute non-ST segment elevation myocardial infarction with troponin I peaking at 1.12. Repeat diagnostic cardiac catheterization revealed interval 100% occlusion of the right coronary artery. There was moderately severe left ventricular systolic dysfunction with ejection fraction estimated 30-35%. There was hypokinesis of the anterior wall, anterolateral wall, and akinesis or distal  dyskinesis of the distal anteroapical segment and a large mural thrombus. Cardiac MRI viability study revealed moderate dilated left ventricle with ejection fraction calculated 35%.  There was a large, partially mobile left ventricular mural thrombus at the left ventricular apex. Cardiothoracic surgical consultation was requested. I had the opportunity to evaluate the patient in consultation on 10/13/2015.  Options were discussed at length and a plan was made to resume medical therapy for at least 3 months of continuous anticoagulation. The patient was strongly advised to quit smoking.  The patient returns to the office for follow-up today and reports that she is doing well. She was seen in follow-up by Dr. Jens Somrenshaw last week. She has been compliant with her medications. She claims that she has not been smoking cigarettes. She denies any symptoms of chest pain or chest tightness either with activity or at rest. She has not had shortness of breath. She has not been exerting herself strenuously.   Current Outpatient Prescriptions  Medication Sig Dispense Refill  . acetaminophen (TYLENOL) 325 MG tablet Take 2 tablets (650 mg total) by mouth every 4 (four) hours as needed for headache or mild pain.    Marland Kitchen. apixaban (ELIQUIS) 5 MG TABS tablet Take 1 tablet (5 mg total) by mouth 2 (two) times daily. 60 tablet 11  . atorvastatin (LIPITOR) 80 MG tablet Take 1 tablet (80 mg total) by mouth daily. 30 tablet 3  . lisinopril (PRINIVIL,ZESTRIL) 2.5 MG tablet Take 1 tablet (2.5 mg total) by mouth daily. 30 tablet 3  . metoprolol succinate (TOPROL-XL) 25 MG 24 hr tablet Take with or immediately following a meal. 30 tablet 9  . pantoprazole (PROTONIX) 40 MG tablet Take 40 mg by mouth daily.     No current facility-administered medications for this  visit.       Physical Exam:   BP 130/80   Pulse 80   Resp 16   Ht 5\' 5"  (1.651 m)   Wt 162 lb (73.5 kg)   LMP 11/06/2015 (Exact Date)   SpO2 98% Comment: ON RA   BMI 26.96 kg/m   General:  Obese but well appearing  Chest:   Clear to auscultation  CV:   Regular rate and rhythm without murmur  Incisions:  n/a  Abdomen:  Soft and nontender  Extremities:  Warm and well-perfused  Diagnostic Tests:  CHEST  2 VIEW  COMPARISON:  PA and lateral chest x-ray of October 14, 2015  FINDINGS: The rump lungs remain well-expanded. There is no focal infiltrate. There is linear atelectasis anteriorly and laterally on the left. The cardiac silhouette is mildly enlarged. The pulmonary vascularity is normal. There is no significant pleural effusion. The mediastinum is normal in width. The bony thorax is unremarkable.  IMPRESSION: Minimal subsegmental atelectasis in the left mid lung. Stable cardiomegaly. No pulmonary edema or significant pleural effusion.   Electronically Signed   By: David  Swaziland M.D.   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 She originally presented with symptoms of epigastric abdominal pain consistent with unstable angina in early May. 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 At that time the left anterior descending coronary artery was open with normal antegrade flow, and medical therapy was recommended. Unfortunately, the patient stopped taking all of her medications approximately one month later and last month she returned with recent acute inferior wall myocardial infarction with 100% occlusion of the right coronary artery.   Medical therapy was resumed at that time including Eliquis for continuous anticoagulation. Since then she has done well and remain compliant with medications. At present she remains clinically stable and essentially symptom-free. I  feel the patient would likely benefit from surgical revascularization at some point, but under the circumstances we should delay at least 2 more months so that her mural thrombus can resolve.  Plan:  We have not recommended any changes to the patient's current medications. The patient will undergo follow-up echocardiogram in approximately 2 months and return to our office at that time. If she is doing well, remains compliant with medical therapy, and the mural thrombus has resolved we may consider elective surgical revascularization. She has been reminded how important it will remain for her to continue to avoid all tobacco use. All of her questions have been addressed.  I spent in excess of 15 minutes during the conduct of this office consultation and >50% of this time involved direct face-to-face encounter with the patient for counseling and/or coordination of their care.    Salvatore Decent. Cornelius Moras, MD 11/14/2015 3:15 PM

## 2015-11-14 NOTE — Patient Instructions (Signed)
Avoid strenuous activity  Report any episodes of chest pain or chest tightness to your cardiologist's office (Dr. Jens Som)  Continue all previous medications without any changes at this time  Do not resume smoking

## 2015-12-08 ENCOUNTER — Other Ambulatory Visit: Payer: Self-pay | Admitting: *Deleted

## 2015-12-08 DIAGNOSIS — I513 Intracardiac thrombosis, not elsewhere classified: Secondary | ICD-10-CM

## 2015-12-13 MED FILL — LISINOPRIL 2.5 MG TABLET: 2.5 | 30 days supply | Qty: 30 | Fill #1

## 2015-12-13 MED FILL — ?PANTOPRAZOLE SOD DR 40MG: 40 MG | 30 days supply | Qty: 30 | Fill #1

## 2015-12-13 MED FILL — METOPROLOL SUCC ER 50 MG TA: 50 | 30 days supply | Qty: 30 | Fill #1

## 2015-12-13 MED FILL — ATORVASTATIN 80 MG TABLET: 80 | 30 days supply | Qty: 30 | Fill #1

## 2016-01-09 ENCOUNTER — Other Ambulatory Visit: Payer: Self-pay

## 2016-01-09 ENCOUNTER — Ambulatory Visit (HOSPITAL_COMMUNITY): Payer: Self-pay | Attending: Cardiovascular Disease

## 2016-01-09 VITALS — BP 130/84 | HR 75

## 2016-01-09 DIAGNOSIS — I340 Nonrheumatic mitral (valve) insufficiency: Secondary | ICD-10-CM | POA: Insufficient documentation

## 2016-01-09 DIAGNOSIS — I236 Thrombosis of atrium, auricular appendage, and ventricle as current complications following acute myocardial infarction: Secondary | ICD-10-CM

## 2016-01-09 DIAGNOSIS — R29898 Other symptoms and signs involving the musculoskeletal system: Secondary | ICD-10-CM | POA: Insufficient documentation

## 2016-01-09 DIAGNOSIS — I361 Nonrheumatic tricuspid (valve) insufficiency: Secondary | ICD-10-CM | POA: Insufficient documentation

## 2016-01-09 DIAGNOSIS — I213 ST elevation (STEMI) myocardial infarction of unspecified site: Secondary | ICD-10-CM

## 2016-01-09 DIAGNOSIS — I501 Left ventricular failure: Secondary | ICD-10-CM | POA: Insufficient documentation

## 2016-01-09 DIAGNOSIS — I351 Nonrheumatic aortic (valve) insufficiency: Secondary | ICD-10-CM | POA: Insufficient documentation

## 2016-01-09 DIAGNOSIS — I513 Intracardiac thrombosis, not elsewhere classified: Secondary | ICD-10-CM

## 2016-01-09 DIAGNOSIS — I5030 Unspecified diastolic (congestive) heart failure: Secondary | ICD-10-CM | POA: Insufficient documentation

## 2016-01-09 LAB — ECHOCARDIOGRAM COMPLETE

## 2016-01-09 MED ORDER — PERFLUTREN LIPID MICROSPHERE
1.0000 mL | INTRAVENOUS | Status: AC | PRN
  Administered 2016-01-09: 2 mL via INTRAVENOUS

## 2016-01-16 ENCOUNTER — Ambulatory Visit (INDEPENDENT_AMBULATORY_CARE_PROVIDER_SITE_OTHER): Payer: Self-pay | Admitting: Thoracic Surgery (Cardiothoracic Vascular Surgery)

## 2016-01-16 ENCOUNTER — Encounter: Payer: Self-pay | Admitting: Thoracic Surgery (Cardiothoracic Vascular Surgery)

## 2016-01-16 VITALS — BP 122/78 | HR 90 | Resp 20 | Ht 65.0 in | Wt 160.0 lb

## 2016-01-16 DIAGNOSIS — I214 Non-ST elevation (NSTEMI) myocardial infarction: Secondary | ICD-10-CM

## 2016-01-16 DIAGNOSIS — I2510 Atherosclerotic heart disease of native coronary artery without angina pectoris: Secondary | ICD-10-CM

## 2016-01-16 DIAGNOSIS — I200 Unstable angina: Secondary | ICD-10-CM

## 2016-01-16 DIAGNOSIS — I255 Ischemic cardiomyopathy: Secondary | ICD-10-CM

## 2016-01-16 NOTE — Patient Instructions (Signed)
Continue all previous medications without any changes at this time  Stop smoking immediately and permanently.  

## 2016-01-16 NOTE — Progress Notes (Signed)
[address]       [place] [address]             [phone]     CARDIOTHORACIC SURGERY OFFICE NOTE  Referring Provider is Lyn Records, MD Primary Cardiologist is CRENSHAW, Madolyn Frieze, MD PCP is Jaclyn Shaggy, MD  HPI:  Patient is a 45 year old African-American female with multiple medical problems who returns to the office for follow-up today related to severe multivessel coronary artery disease with ischemic cardiomyopathy associated with left ventricular mural thrombus. The patient originally presented on 08/01/2015 with primary complaint of epigastric abdominal pain associated with nausea and vomiting.  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 Transthoracic echocardiogram revealed diffuse global left ventricular systolic dysfunction with ejection fraction estimated 35-40%. There was a large mural thrombus involving the distal anterior wall and apex of the left ventricle. Diagnostic cardiac catheterization revealed diffuse two-vessel coronary artery disease with 90% stenosis of the left anterior descending coronary artery. 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 She was treated medically and started on Eliquis for anticoagulation.  The patient stopped taking all of her medications in early June. She was readmitted to the hospital 10/08/2015 with symptoms of nausea, vomiting, and abdominal discomfort similar to that which she experienced in May. She ruled in for an acute non-ST segment elevation myocardial infarction with troponin I peaking at 1.12. Repeat diagnostic cardiac catheterization revealed interval 100% occlusion of the right coronary artery. There was moderately severe left ventricular systolic dysfunction with ejection fraction estimated 30-35%. There was hypokinesis of the anterior wall, anterolateral wall, and akinesis or distal  dyskinesis of the distal anteroapical segment and a large mural thrombus. Cardiac MRI viability study revealed moderate dilated left ventricle with ejection fraction calculated 35%.  There was a large, partially mobile left ventricular mural thrombus at the left ventricular apex. Cardiothoracic surgical consultation was requested. I had the opportunity to evaluate the patient in consultation on 10/13/2015.  Options were discussed at length and a plan was made to resume medical therapy for at least 3 months of continuous anticoagulation. The patient was strongly advised to quit smoking.  She was seen in follow-up by Dr. Jens Som on 11/11/2015 and she was last seen in our office on 11/14/2015 at which time she denied any symptoms of chest pain or chest tightness either with activity or at rest.  She returns to our office for follow-up today. She reports that she has continued to remain compliant with all medical therapy. She does admit that she smokes one or 2 cigarettes daily. She denies any symptoms of exertional chest pain or chest tightness. She denies any symptoms of exertional shortness of breath.  She has never had any recurrent episodes of epigastric abdominal pain or chest pain similar to that which occurred at the time of her 2 previous hospitalizations. She does not work but she reports normal, unrestricted physical activity. She states that she has no physical limitations. She is asking for documentation file for medical disability.  The remainder of her review of systems is unremarkable.   Current Outpatient Prescriptions  Medication Sig Dispense Refill  . acetaminophen (TYLENOL) 325 MG tablet Take 2 tablets (650 mg total) by mouth every 4 (four) hours as needed for headache or mild pain.    Marland Kitchen apixaban (ELIQUIS) 5 MG TABS tablet Take 1 tablet (5 mg total) by mouth 2 (two) times  daily. 60 tablet 11  . atorvastatin (LIPITOR) 80 MG tablet Take 1 tablet (80 mg total) by mouth daily. 30 tablet 3  .  lisinopril (PRINIVIL,ZESTRIL) 2.5 MG tablet Take 1 tablet (2.5 mg total) by mouth daily. 30 tablet 3  . metoprolol succinate (TOPROL-XL) 25 MG 24 hr tablet Take with or immediately following a meal. 30 tablet 9  . pantoprazole (PROTONIX) 40 MG tablet Take 40 mg by mouth daily.     No current facility-administered medications for this visit.       Physical Exam:   BP 122/78 (BP Location: Right Arm, Patient Position: Sitting, Cuff Size: Normal)   Pulse 90   Resp 20   Ht 5\' 5"  (1.651 m)   Wt 160 lb (72.6 kg)   SpO2 93%   BMI 26.63 kg/m   General:  Moderately obese but well appearing  Chest:   Clear to auscultation  CV:   Regular rate and rhythm without murmur  Incisions:  n/a  Abdomen:  Soft nontender  Extremities:  Warm and well-perfused, no edema  Diagnostic Tests:  Transthoracic Echocardiography  Patient:    Obie, Silos MR #:       [id number] Study Date: 01/09/2016 Gender:     F Age:        45 Height:     165.1 cm Weight:     73.5 kg BSA:        1.85 m^2 Pt. Status: Room:   Rutherford Limerick, M.D.  REFERRING    Tressie Stalker, M.D.  SONOGRAPHER  Randa Evens, Will  PERFORMING   [facility], Outpatient  ATTENDING    Chilton Si, MD  cc:  ------------------------------------------------------------------- LV EF: 40% -   45%  ------------------------------------------------------------------- Indications:      (I23.6).  ------------------------------------------------------------------- History:   PMH:  Ischemic cardiomyopathy. LV apical mural thrombus. Acquired from the patient and from the patient&'s chart.  Coronary artery disease.  PMH:   Myocardial infarction.  Risk factors: Current tobacco use.  ------------------------------------------------------------------- Study Conclusions  - Left ventricle: The cavity size was mildly dilated. Systolic   function was mildly to moderately reduced. The estimated ejection   fraction was in the  range of 40% to 45%. Diffuse hypokinesis.   Akinesis of the mid-apical anteroseptal, apical inferior, apical   anterior, and apical myocardium. Doppler parameters are   consistent with a reversible restrictive pattern, indicative of   decreased left ventricular diastolic compliance and/or increased   left atrial pressure (grade 3 diastolic dysfunction). Doppler   parameters are consistent with high ventricular filling pressure.   There was a 2.8 cm (L) x 1.2 cm (W), apicalthrombus. - Aortic valve: Transvalvular velocity was within the normal range.   There was no stenosis. There was mild regurgitation. - Mitral valve: Transvalvular velocity was within the normal range.   There was no evidence for stenosis. There was mild regurgitation. - Left atrium: The atrium was moderately dilated. - Right ventricle: The cavity size was normal. Wall thickness was   normal. Systolic function was normal. - Tricuspid valve: There was mild regurgitation. - Pulmonary arteries: Systolic pressure was within the normal   range. PA peak pressure: 26 mm Hg (S).  ------------------------------------------------------------------- Study data:  Comparison was made to the study of 10/11/2015.  Study status:  Routine.  Procedure:  The patient reported no pain pre or post test. Transthoracic echocardiography for left ventricular function evaluation. Image quality was adequate. Intravenous contrast (Definity) was administered to opacify the  LV.  Study completion:  There were no complications.          Transthoracic echocardiography.  M-mode, complete 2D, spectral Doppler, and color Doppler.  Birthdate:  Patient birthdate: [birth year].  Age:  Patient is 45 yr old.  Sex:  Gender: female.    BMI: 27 kg/m^2.  Blood pressure:     110/64  Patient status:  Outpatient.  Study date: Study date: 01/09/2016. Study time: 10:43 AM.  Location:  [facility]  3  -------------------------------------------------------------------  ------------------------------------------------------------------- Left ventricle:  The cavity size was mildly dilated. Systolic function was mildly to moderately reduced. The estimated ejection fraction was in the range of 40% to 45%. Diffuse hypokinesis. There was a 2.8 cm (L) x 1.2 cm (W), apicalthrombus.  Regional wall motion abnormalities:  Akinesis of the mid-apical anteroseptal, apical inferior, apical anterior, and apical myocardium. Doppler parameters are consistent with a reversible restrictive pattern, indicative of decreased left ventricular diastolic compliance and/or increased left atrial pressure (grade 3 diastolic dysfunction). Doppler parameters are consistent with high ventricular filling pressure.  ------------------------------------------------------------------- Aortic valve:   Trileaflet; normal thickness leaflets. Mobility was not restricted.  Doppler:  Transvalvular velocity was within the normal range. There was no stenosis. There was mild regurgitation.   ------------------------------------------------------------------- Aorta:  Aortic root: The aortic root was normal in size.  ------------------------------------------------------------------- Mitral valve:   Structurally normal valve.   Mobility was not restricted.  Doppler:  Transvalvular velocity was within the normal range. There was no evidence for stenosis. There was mild regurgitation.    Peak gradient (D): 6 mm Hg.  ------------------------------------------------------------------- Left atrium:  The atrium was moderately dilated.  ------------------------------------------------------------------- Right ventricle:  The cavity size was normal. Wall thickness was normal. Systolic function was normal.  ------------------------------------------------------------------- Pulmonic valve:    Structurally normal valve.    Cusp separation was normal.  Doppler:  Transvalvular velocity was within the normal range. There was no evidence for stenosis. There was trivial regurgitation.  ------------------------------------------------------------------- Tricuspid valve:   Structurally normal valve.    Doppler: Transvalvular velocity was within the normal range. There was mild regurgitation.  ------------------------------------------------------------------- Pulmonary artery:   The main pulmonary artery was normal-sized. Systolic pressure was within the normal range.  ------------------------------------------------------------------- Right atrium:  The atrium was normal in size.  ------------------------------------------------------------------- Pericardium:  There was no pericardial effusion.  ------------------------------------------------------------------- Systemic veins: Inferior vena cava: The vessel was normal in size. The respirophasic diameter changes were in the normal range (>= 50%), consistent with normal central venous pressure.  ------------------------------------------------------------------- Measurements   Left ventricle                           Value        Reference  LV ID, ED, PLAX chordal          (H)     54.8  mm     43 - 52  LV ID, ES, PLAX chordal          (H)     39.9  mm     23 - 38  LV fx shortening, PLAX chordal   (L)     27    %      >=29  LV PW thickness, ED                      11.2  mm     ---------  IVS/LV PW ratio, ED  0.98         <=1.3  Stroke volume, 2D                        39    ml     ---------  Stroke volume/bsa, 2D                    21    ml/m^2 ---------  LV ejection fraction, 1-p A4C            45    %      ---------  LV end-diastolic volume, 2-p             94    ml     ---------  LV end-systolic volume, 2-p              50    ml     ---------  LV ejection fraction, 2-p                47    %      ---------  Stroke  volume, 2-p                       44    ml     ---------  LV end-diastolic volume/bsa, 2-p         51    ml/m^2 ---------  LV end-systolic volume/bsa, 2-p          27    ml/m^2 ---------  Stroke volume/bsa, 2-p                   23.8  ml/m^2 ---------  LV e&', lateral                           7.31  cm/s   ---------  LV E/e&', lateral                         16.69        ---------  LV e&', medial                            7.41  cm/s   ---------  LV E/e&', medial                          16.46        ---------  LV e&', average                           7.36  cm/s   ---------  LV E/e&', average                         16.58        ---------    Ventricular septum                       Value        Reference  IVS thickness, ED                        11    mm     ---------    LVOT  Value        Reference  LVOT ID, S                               20    mm     ---------  LVOT area                                3.14  cm^2   ---------  LVOT ID                                  20    mm     ---------  LVOT peak velocity, S                    62.4  cm/s   ---------  LVOT mean velocity, S                    41    cm/s   ---------  LVOT VTI, S                              12.4  cm     ---------  LVOT peak gradient, S                    2     mm Hg  ---------  Stroke volume (SV), LVOT DP              39    ml     ---------  Stroke index (SV/bsa), LVOT DP           21    ml/m^2 ---------    Aorta                                    Value        Reference  Aortic root ID, ED                       28    mm     ---------  Ascending aorta ID, A-P, S               27    mm     ---------    Left atrium                              Value        Reference  LA ID, A-P, ES                           40    mm     ---------  LA ID/bsa, A-P                           2.16  cm/m^2 <=2.2  LA volume, S                             66    ml     ---------  LA volume/bsa, S                          35.6  ml/m^2 ---------  LA volume, ES, 1-p A4C                   64    ml     ---------  LA volume/bsa, ES, 1-p A4C               34.6  ml/m^2 ---------  LA volume, ES, 1-p A2C                   56    ml     ---------  LA volume/bsa, ES, 1-p A2C               30.2  ml/m^2 ---------    Mitral valve                             Value        Reference  Mitral E-wave peak velocity              122   cm/s   ---------  Mitral A-wave peak velocity              55.3  cm/s   ---------  Mitral deceleration time                 208   ms     150 - 230  Mitral peak gradient, D                  6     mm Hg  ---------  Mitral E/A ratio, peak                   2.2          ---------    Pulmonary arteries                       Value        Reference  PA pressure, S, DP                       26    mm Hg  <=30    Tricuspid valve                          Value        Reference  Tricuspid regurg peak velocity           242   cm/s   ---------  Tricuspid peak RV-RA gradient            23    mm Hg  ---------    Systemic veins                           Value        Reference  Estimated CVP                            3     mm Hg  ---------    Right ventricle  Value        Reference  RV pressure, S, DP                       26    mm Hg  <=30  RV s&', lateral, S                        10.7  cm/s   ---------  Legend: (L)  and  (H)  mark values outside specified reference range.  ------------------------------------------------------------------- Prepared and Electronically Authenticated by  Chilton Si, MD 2017-10-09T15:32:56   Impression:  Patient has severe multivessel coronary artery disease with ischemic cardiomyopathy, apical left ventricular aneurysm and left ventricular mural thrombus. She originally presented with symptoms of epigastric abdominal pain consistent with unstable angina in early May. 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 At that time the left anterior descending coronary artery was open with normal antegrade flow, and medical therapy was recommended. Unfortunately, the patient stopped taking all of her medications approximately one month later and last month she returned with recent acute inferior wall myocardial infarction with 100% occlusion of the right coronary artery.  Medical therapy was resumed at that time including Eliquis for continuous anticoagulation. Since then she has done well and remain compliant with medications. At present she remains clinically stable and essentially symptom-free.  I have personally reviewed the patient's recent follow-up transthoracic echocardiogram. 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 There is also at least mild mitral regurgitation. Her most recent catheterization revealed 100% occlusion of the right coronary artery with left-to-right collateral filling of the terminal branches. The left anterior descending coronary artery remained patent. There were no other flow limiting lesions. The potential benefits of coronary artery bypass grafting with or without resection of left ventricular aneurysm remains somewhat unclear in this patient who claims to be asymptomatic.   Plan:  The patient is scheduled to follow-up with Dr. Jens Som at [facility] in approximately 3 weeks. We have not recommended any changes to her current medications at this time.   Her case will be reviewed by a multidisciplinary team at specialists. At this point I am leaning towards continued medical therapy with close follow-up rather than proceeding with elective surgery.  The patient has been reminded how important will be for her to find a way to quit smoking completely.  The  patient will return to our office for follow-up in a proximally 6 months or sooner should clear indications for surgical intervention arise.  I spent in excess of 15 minutes during the conduct of this office consultation and >50% of this time involved direct face-to-face encounter with the patient for counseling and/or coordination of their care.    Salvatore Decent. Cornelius Moras, MD 01/16/2016 5:54 PM

## 2016-01-17 ENCOUNTER — Telehealth: Payer: Self-pay | Admitting: *Deleted

## 2016-01-17 MED FILL — ATORVASTATIN 80 MG TABLET: 80 | 30 days supply | Qty: 30 | Fill #2

## 2016-01-17 MED FILL — METOPROLOL SUCC ER 50 MG TA: 50 | 30 days supply | Qty: 30 | Fill #2

## 2016-01-17 MED FILL — LISINOPRIL 2.5 MG TABLET: 2.5 | 30 days supply | Qty: 30 | Fill #2

## 2016-01-17 MED FILL — PANTOPRAZOLE SOD DR 40 MG T: 40 | 30 days supply | Qty: 30 | Fill #2

## 2016-01-17 NOTE — Telephone Encounter (Signed)
Patient came in office today requesting a letter stating she is unable to lift over 5 pounds/ work.  She is needing this to take to the Social Security office to file for disability Advised patient Dr Jens Som not in the office today Patient stated she just needed to get SS letter by the end of the month and appreciated my time.  Will forward to Dr Jens Som for review

## 2016-01-17 NOTE — Telephone Encounter (Signed)
Schedule fuov Casey [name]  

## 2016-01-17 NOTE — Telephone Encounter (Signed)
Spoke with pt, Aware of dr Ludwig Clarks recommendations. She has a follow up in November.

## 2016-02-08 NOTE — Progress Notes (Signed)
HPI: FU coronary artery disease. Patient previously admitted with abdominal pain. Abdominal CT showed thrombus at the LV apex. Echocardiogram May 2017 showed ejection fraction 35-40% with anterior and apical wall motion abnormality. Grade 2 diastolic dysfunction. Thrombus at the apex. Mild mitral regurgitation and mild left atrial enlargement. Cardiac catheterization May 2017 showed a 90% proximal to mid LAD, 60% ramus and 50% right coronary artery. It was felt that she had likely infarcted the LAD territory and medical therapy recommended unless she has recurrent symptoms. She was placed on apixaban. Readmitted July 2017 and cardiac cath showed a 70% ramus, 50% followed by 55% LAD and occluded right coronary artery. Ejection 30-35%.  Apical thrombus noted. Cardiac MRI July 2017 showed ejection fraction 35%. There was a large apical thrombus. There was mild mitral regurgitation.  Patient seen by Dr. Cornelius Moras October 2017. He now feels that medical therapy is best treatment option. There has been problems with compliance. Echocardiogram repeated October 2017 and showed ejection fraction 40-45%, grade 3 diastolic dysfunction, apical thrombus, mild aortic insufficiency and mitral regurgitation and moderate left atrial enlargement. Since last seen, the patient has dyspnea with more extreme activities but not with routine activities. It is relieved with rest. It is not associated with chest pain. There is no orthopnea, PND or pedal edema. There is no syncope or palpitations. There is no exertional chest pain.   Current Outpatient Prescriptions  Medication Sig Dispense Refill  . apixaban (ELIQUIS) 5 MG TABS tablet Take 1 tablet (5 mg total) by mouth 2 (two) times daily. 60 tablet 11  . atorvastatin (LIPITOR) 80 MG tablet Take 1 tablet (80 mg total) by mouth daily. 30 tablet 3  . lisinopril (PRINIVIL,ZESTRIL) 2.5 MG tablet Take 1 tablet (2.5 mg total) by mouth daily. 30 tablet 3  . metoprolol succinate  (TOPROL-XL) 25 MG 24 hr tablet Take with or immediately following a meal. 30 tablet 9  . pantoprazole (PROTONIX) 40 MG tablet Take 40 mg by mouth daily.     No current facility-administered medications for this visit.      Past Medical History:  Diagnosis Date  . Anticoagulation adequate 10/14/2015  . CAD (coronary artery disease)   . Coronary atherosclerosis of native coronary artery, multivessel with plans for CABG to be followed   08/04/2015  . Ischemic cardiomyopathy   . UTI (urinary tract infection) 10/14/2015    Past Surgical History:  Procedure Laterality Date  . CARDIAC CATHETERIZATION N/A 08/04/2015   Procedure: Coronary/Graft Angiography;  Surgeon: Peter M Swaziland, MD;  Location: [facility] INVASIVE CV LAB;  Service: Cardiovascular;  Laterality: N/A;  . CARDIAC CATHETERIZATION N/A 10/10/2015   Procedure: Left Heart Cath and Coronary Angiography;  Surgeon: Lennette Bihari, MD;  Location: [facility] INVASIVE CV LAB;  Service: Cardiovascular;  Laterality: N/A;  . CESAREAN SECTION      Social History   Social History  . Marital status: Divorced    Spouse name: N/A  . Number of children: N/A  . Years of education: N/A   Occupational History  . Not on file.   Social History Main Topics  . Smoking status: Current Some Day Smoker    Packs/day: 0.50    Types: Cigarettes  . Smokeless tobacco: Never Used  . Alcohol use No     Comment: Two drinks a night.  . Drug use: No     Comment: No history of cocaine use  . Sexual activity: Not on file   Other Topics Concern  .  Not on file   Social History Narrative  . No narrative on file    Family History  Problem Relation Age of Onset  . Diabetes Mellitus II Neg Hx   . CAD Neg Hx     ROS: no fevers or chills, productive cough, hemoptysis, dysphasia, odynophagia, melena, hematochezia, dysuria, hematuria, rash, seizure activity, orthopnea, PND, pedal edema, claudication. Remaining systems are negative.  Physical Exam: Well-developed  well-nourished in no acute distress.  Skin is warm and dry.  HEENT is normal.  Neck is supple.  Chest is clear to auscultation with normal expansion.  Cardiovascular exam is regular rate and rhythm.  Abdominal exam nontender or distended. No masses palpated. Extremities show no edema. neuro grossly intact  A/P  1 coronary artery disease-Dr. Cornelius Moraswen now feels medical therapy is most appropriate for now. Continue statin. No aspirin given need for anticoagulation.   2hyperlipidemia-continue statin. Check lipids and liver.   3 ischemic cardiomyopathy-continue ACE inhibitor and beta blocker.  4 LV apical thrombus-continue apixabaan; check hgb and renal function.  5 tobacco abuse-patient has discontinued.   Casey MillersBrian Jianni Shelden, MD

## 2016-02-13 ENCOUNTER — Encounter: Payer: Self-pay | Admitting: Cardiology

## 2016-02-13 ENCOUNTER — Ambulatory Visit (INDEPENDENT_AMBULATORY_CARE_PROVIDER_SITE_OTHER): Payer: Self-pay | Admitting: Cardiology

## 2016-02-13 VITALS — BP 108/62 | HR 84 | Ht 65.0 in | Wt 168.0 lb

## 2016-02-13 DIAGNOSIS — I2510 Atherosclerotic heart disease of native coronary artery without angina pectoris: Secondary | ICD-10-CM

## 2016-02-13 DIAGNOSIS — Z720 Tobacco use: Secondary | ICD-10-CM

## 2016-02-13 DIAGNOSIS — I10 Essential (primary) hypertension: Secondary | ICD-10-CM

## 2016-02-13 DIAGNOSIS — I255 Ischemic cardiomyopathy: Secondary | ICD-10-CM

## 2016-02-13 NOTE — Patient Instructions (Signed)

## 2016-02-20 DIAGNOSIS — Z736 Limitation of activities due to disability: Secondary | ICD-10-CM

## 2016-02-29 MED FILL — ATORVASTATIN 80 MG TABLET: 80 | 30 days supply | Qty: 30 | Fill #3

## 2016-02-29 MED FILL — PANTOPRAZOLE SOD DR 40 MG T: 40 | 30 days supply | Qty: 30 | Fill #3

## 2016-02-29 MED FILL — LISINOPRIL 2.5 MG TABLET: 2.5 | 30 days supply | Qty: 30 | Fill #3

## 2016-02-29 MED FILL — METOPROLOL SUCC ER 50 MG TA: 50 | 30 days supply | Qty: 30 | Fill #3

## 2016-03-16 ENCOUNTER — Encounter: Payer: Self-pay | Admitting: *Deleted

## 2016-05-21 ENCOUNTER — Other Ambulatory Visit: Payer: Self-pay | Admitting: Family Medicine

## 2016-05-21 ENCOUNTER — Other Ambulatory Visit: Payer: Self-pay | Admitting: *Deleted

## 2016-05-21 DIAGNOSIS — I25110 Atherosclerotic heart disease of native coronary artery with unstable angina pectoris: Secondary | ICD-10-CM

## 2016-05-21 DIAGNOSIS — I255 Ischemic cardiomyopathy: Secondary | ICD-10-CM

## 2016-05-21 NOTE — Telephone Encounter (Signed)
°*  STAT* If patient is at the pharmacy, call can be transferred to refill team.   1. Which medications need to be refilled? (please list name of each medication and dose if known) Metoprolol 50mg , Lisinopril 2.5mg  , Atorvastatin 40mg  , and Pantopravole 40 mg   2. Which pharmacy/location (including street and city if local pharmacy) is medication to be sent to?[facility] on [address]  3. Do they need a 30 day or 90 day supply? 30   need a new prescription

## 2016-05-22 MED ORDER — ATORVASTATIN CALCIUM 80 MG PO TABS: 80.0000 mg | ORAL_TABLET | Freq: Every day | ORAL | 9 refills | Status: DC

## 2016-05-22 MED ORDER — PANTOPRAZOLE SODIUM 40 MG PO TBEC: 40.0000 mg | DELAYED_RELEASE_TABLET | Freq: Every day | ORAL | 9 refills | Status: DC

## 2016-05-22 MED ORDER — LISINOPRIL 2.5 MG PO TABS: 2.5000 mg | ORAL_TABLET | Freq: Every day | ORAL | 9 refills | Status: DC

## 2016-05-22 MED ORDER — METOPROLOL SUCCINATE ER 25 MG PO TB24: 25.0000 mg | ORAL_TABLET | Freq: Every day | ORAL | 9 refills | Status: DC

## 2016-05-22 NOTE — Telephone Encounter (Signed)
Rx(s) sent to pharmacy electronically.  

## 2016-06-18 MED FILL — LISINOPRIL 2.5 MG TABLET: 2.5 | 30 days supply | Qty: 30 | Fill #0

## 2016-06-18 MED FILL — METOPROLOL SUCC ER 25 MG TA: 25 | 30 days supply | Qty: 30 | Fill #0

## 2016-06-18 MED FILL — ?PANTOPRAZOLE SOD DR 40MG: 40 MG | 30 days supply | Qty: 30 | Fill #0

## 2016-06-18 MED FILL — ATORVASTATIN 80 MG TABLET: 80 | 30 days supply | Qty: 30 | Fill #0

## 2016-07-16 ENCOUNTER — Encounter: Payer: Self-pay | Admitting: Thoracic Surgery (Cardiothoracic Vascular Surgery)

## 2016-07-23 ENCOUNTER — Encounter: Payer: Self-pay | Admitting: Thoracic Surgery (Cardiothoracic Vascular Surgery)

## 2016-07-24 ENCOUNTER — Encounter: Payer: Self-pay | Admitting: Cardiology

## 2016-07-31 DIAGNOSIS — Z736 Limitation of activities due to disability: Secondary | ICD-10-CM

## 2016-08-07 NOTE — Progress Notes (Deleted)
HPI: FU coronary artery disease. Patient previously admitted with abdominal pain. Abdominal CT showed thrombus at the LV apex. Echocardiogram May 2017 showed ejection fraction 35-40% with anterior and apical wall motion abnormality. Grade 2 diastolic dysfunction. Thrombus at the apex. Mild mitral regurgitation and mild left atrial enlargement. Cardiac catheterization May 2017 showed a 90% proximal to mid LAD, 60% ramus and 50% right coronary artery. It was felt that she had likely infarcted the LAD territory and medical therapy recommended unless she has recurrent symptoms. She was placed on apixaban. Readmitted July 2017 and cardiac cath showed a 70% ramus, 50% followed by 55% LAD and occluded right coronary artery. Ejection 30-35%. Apical thrombus noted. Cardiac MRI July 2017 showed ejection fraction 35%. There was a large apical thrombus. There was mild mitral regurgitation.  Patient seen by Dr. Cornelius Moras October 2017. He now feels that medical therapy is best treatment option. There has been problems with compliance. Echocardiogram repeated October 2017 and showed ejection fraction 40-45%, grade 3 diastolic dysfunction, apical thrombus, mild aortic insufficiency and mitral regurgitation and moderate left atrial enlargement. Since last seen,   Current Outpatient Prescriptions  Medication Sig Dispense Refill  . apixaban (ELIQUIS) 5 MG TABS tablet Take 1 tablet (5 mg total) by mouth 2 (two) times daily. 60 tablet 11  . atorvastatin (LIPITOR) 80 MG tablet Take 1 tablet (80 mg total) by mouth daily. 30 tablet 9  . lisinopril (PRINIVIL,ZESTRIL) 2.5 MG tablet Take 1 tablet (2.5 mg total) by mouth daily. 30 tablet 9  . metoprolol succinate (TOPROL-XL) 25 MG 24 hr tablet Take 1 tablet (25 mg total) by mouth daily. Take with or immediately following a meal. 30 tablet 9  . pantoprazole (PROTONIX) 40 MG tablet Take 1 tablet (40 mg total) by mouth daily. 30 tablet 9   No current facility-administered  medications for this visit.      Past Medical History:  Diagnosis Date  . Anticoagulation adequate 10/14/2015  . CAD (coronary artery disease)   . Coronary atherosclerosis of native coronary artery, multivessel with plans for CABG to be followed   08/04/2015  . Ischemic cardiomyopathy   . UTI (urinary tract infection) 10/14/2015    Past Surgical History:  Procedure Laterality Date  . CARDIAC CATHETERIZATION N/A 08/04/2015   Procedure: Coronary/Graft Angiography;  Surgeon: Peter M Swaziland, MD;  [facility] INVASIVE CV LAB;  Service: Cardiovascular;  Laterality: N/A;  . CARDIAC CATHETERIZATION N/A 10/10/2015   Procedure: Left Heart Cath and Coronary Angiography;  Surgeon: Lennette Bihari, MD;  Location: [facility] INVASIVE CV LAB;  Service: Cardiovascular;  Laterality: N/A;  . CESAREAN SECTION      Social History   Social History  . Marital status: Divorced    Spouse name: N/A  . Number of children: N/A  . Years of education: N/A   Occupational History  . Not on file.   Social History Main Topics  . Smoking status: Current Some Day Smoker    Packs/day: 0.50    Types: Cigarettes  . Smokeless tobacco: Never Used  . Alcohol use No     Comment: Two drinks a night.  . Drug use: No     Comment: No history of cocaine use  . Sexual activity: Not on file   Other Topics Concern  . Not on file   Social History Narrative  . No narrative on file    Family History  Problem Relation Age of Onset  . Diabetes Mellitus II Neg Hx   .  CAD Neg Hx     ROS: no fevers or chills, productive cough, hemoptysis, dysphasia, odynophagia, melena, hematochezia, dysuria, hematuria, rash, seizure activity, orthopnea, PND, pedal edema, claudication. Remaining systems are negative.  Physical Exam: Well-developed well-nourished in no acute distress.  Skin is warm and dry.  HEENT is normal.  Neck is supple.  Chest is clear to auscultation with normal expansion.  Cardiovascular exam is regular rate and  rhythm.  Abdominal exam nontender or distended. No masses palpated. Extremities show no edema. neuro grossly intact  ECG- personally reviewed  A/P  1  Casey Millers, MD

## 2016-08-13 ENCOUNTER — Ambulatory Visit: Payer: Self-pay | Admitting: Cardiology

## 2016-09-27 ENCOUNTER — Telehealth: Payer: Self-pay | Admitting: *Deleted

## 2016-09-27 NOTE — Telephone Encounter (Signed)
[employer] patient assistance foundation for [employer] provider information and script completed. Awaiting for patient to complete her portion, mailed to patient by Danielle Rankin

## 2016-09-27 NOTE — Telephone Encounter (Signed)
Nada Boozer, NP received Eliquis Assistance renewal form to be filled out. I s/w Addison Lank, RN one of our Asst. Prgm nurse who advised to mail out the pt's portion and she will hold the doctor portion of the form for our office to fill out. Per Selena Batten make sure pt's paperwork is returned to [employer], LPN. Once we receive the pt's portion of the renewal Eliquis Asst Prgm paperwork our office will fax the all the paperwork in to see if pt will be approved again for the Eliquis through the Asst Prgm. I will route this message to [facility], LPN as an [place].

## 2016-10-24 ENCOUNTER — Other Ambulatory Visit: Payer: Self-pay | Admitting: Pharmacist Clinician (PhC)/ Clinical Pharmacy Specialist

## 2016-10-24 MED ORDER — APIXABAN 5 MG PO TABS: 5.0000 mg | ORAL_TABLET | Freq: Two times a day (BID) | ORAL | 1 refills | Status: DC

## 2016-10-24 MED ORDER — APIXABAN 5 MG PO TABS
5.0000 mg | ORAL_TABLET | Freq: Two times a day (BID) | ORAL | 1 refills | Status: DC
Start: 2016-10-24 — End: 2016-10-24

## 2016-12-20 ENCOUNTER — Telehealth: Payer: Self-pay | Admitting: Cardiology

## 2016-12-20 DIAGNOSIS — I25110 Atherosclerotic heart disease of native coronary artery with unstable angina pectoris: Secondary | ICD-10-CM

## 2016-12-20 MED ORDER — METOPROLOL SUCCINATE ER 25 MG PO TB24: 25.0000 mg | ORAL_TABLET | Freq: Every day | ORAL | 0 refills | Status: DC

## 2016-12-20 MED ORDER — ATORVASTATIN CALCIUM 80 MG PO TABS: 80.0000 mg | ORAL_TABLET | Freq: Every day | ORAL | 0 refills | Status: DC

## 2016-12-20 MED ORDER — PANTOPRAZOLE SODIUM 40 MG PO TBEC: 40.0000 mg | DELAYED_RELEASE_TABLET | Freq: Every day | ORAL | 0 refills | Status: DC

## 2016-12-20 NOTE — Telephone Encounter (Signed)
New message    Refill meds  1. lipitor 80 mg 2. Metoprolol 25mg  3. protonix 40mg   [facility] 30 day supply (smartphrase not loaded yet)

## 2016-12-20 NOTE — Telephone Encounter (Signed)
Refill authorization sent to pt's preferred pharmacy for #30 and 0 refills - she is overdue for recommended follow up. She does have an appt w Wynema Birch on 10/4 but I saw that this had not been confirmed. I called pt back but no answer or means to leave VM.

## 2017-01-03 ENCOUNTER — Ambulatory Visit: Payer: Self-pay | Admitting: Physician Assistant

## 2017-01-03 NOTE — Progress Notes (Deleted)
Cardiology Office Note    Date:  01/03/2017   ID:  Casey [name], DOB [birth year], MRN [id number]  PCP:  Jaclyn Shaggy, MD  Cardiologist:  Dr. Jens Som  [name] chief complaint on file.   History of Present Illness:  Casey [name] is a 46 y.o. female with PMH of ICM and CAD. Patient was previously admitted with abdominal pain. Abdominal CT showed thrombus at the LV apex. Echocardiogram in May 2017 showed ejection fraction 35-40% with anterior and apical wall motion abnormality, grade 2 DD, thrombus at the apex, mild MR, mild left atrial enlargement. Cardiac catheterization followed in May 2017 showed 90% proximal to mid LAD, 60% ramus, 50% RCA. It was felt she likely had infarcted LAD territory and medical therapy was recommended unless she has any recurrence of the symptoms. She was placed on eliquis. Later she was readmitted in July 2017, cardiac catheterization at that time showed a 70% ramus, 50% followed by 55% LAD, occluded RCA, EF 30-35%. Apical thrombus noted again. Cardiac MRI in July 2017 showed EF 35%, large apical thrombus, mild MR. She was seen by Dr. Cornelius Moras in October 2017, and felt medical therapy was the best option. Compliance issue. Echocardiogram repeated in October 2017 showed EF 40-45%, grade 3 DD, apical thrombus, mild AI and mitral regurgitation with moderate LAE.  She has dyspnea was more extreme activities but not with routine activity. She was last seen in November 2017, 6 month follow-up was recommended.  Yes EKG  Past Medical History:  Diagnosis Date  . Anticoagulation adequate 10/14/2015  . CAD (coronary artery disease)   . Coronary atherosclerosis of native coronary artery, multivessel with plans for CABG to be followed   08/04/2015  . Ischemic cardiomyopathy   . UTI (urinary tract infection) 10/14/2015    Past Surgical History:  Procedure Laterality Date  . CARDIAC CATHETERIZATION N/A 08/04/2015   Procedure: Coronary/Graft Angiography;  Surgeon: Peter M  Swaziland, MD;  Location: [facility] INVASIVE CV LAB;  Service: Cardiovascular;  Laterality: N/A;  . CARDIAC CATHETERIZATION N/A 10/10/2015   Procedure: Left Heart Cath and Coronary Angiography;  Surgeon: Lennette Bihari, MD;  Location: [facility] INVASIVE CV LAB;  Service: Cardiovascular;  Laterality: N/A;  . CESAREAN SECTION      Current Medications: Outpatient Medications Prior to Visit  Medication Sig Dispense Refill  . apixaban (ELIQUIS) 5 MG TABS tablet Take 1 tablet (5 mg total) by mouth 2 (two) times daily. 180 tablet 1  . atorvastatin (LIPITOR) 80 MG tablet Take 1 tablet (80 mg total) by mouth daily. 30 tablet 0  . lisinopril (PRINIVIL,ZESTRIL) 2.5 MG tablet Take 1 tablet (2.5 mg total) by mouth daily. 30 tablet 9  . metoprolol succinate (TOPROL-XL) 25 MG 24 hr tablet Take 1 tablet (25 mg total) by mouth daily. Take with or immediately following a meal. 30 tablet 0  . pantoprazole (PROTONIX) 40 MG tablet Take 1 tablet (40 mg total) by mouth daily. 30 tablet 0   [name] facility-administered medications prior to visit.      Allergies:   Patient has [name] known allergies.   Social History   Social History  . Marital status: Divorced    Spouse name: N/A  . Number of children: N/A  . Years of education: N/A   Social History Main Topics  . Smoking status: Current Some Day Smoker    Packs/day: 0.50    Types: Cigarettes  . Smokeless tobacco: Never Used  . Alcohol use [name]     Comment:  Two drinks a night.  . Drug use: [name]     Comment: [name] history of cocaine use  . Sexual activity: Not on file   Other Topics Concern  . Not on file   Social History Narrative  . [name] narrative on file     Family History:  The patient's ***family history is not on file.   ROS:   Please see the history of present illness.    ROS All other systems reviewed and are negative.   PHYSICAL EXAM:   VS:  There were [name] vitals taken for this visit.   GEN: Well nourished, well developed, in [name] acute distress  HEENT: normal    Neck: [name] JVD, carotid bruits, or masses Cardiac: ***RRR; [name] murmurs, rubs, or gallops,[name] edema  Respiratory:  clear to auscultation bilaterally, normal work of breathing GI: soft, nontender, nondistended, + BS MS: [name] deformity or atrophy  Skin: warm and dry, [name] rash Neuro:  Alert and Oriented x 3, Strength and sensation are intact Psych: euthymic mood, full affect  Wt Readings from Last 3 Encounters:  02/13/16 168 lb (76.2 kg)  01/16/16 160 lb (72.6 kg)  11/14/15 162 lb (73.5 kg)      Studies/Labs Reviewed:   EKG:  EKG is*** ordered today.  The ekg ordered today demonstrates ***  Recent Labs: [name] results found for requested labs within last 8760 hours.   Lipid Panel    Component Value Date/Time   CHOL 113 10/14/2015 0357   TRIG 67 10/14/2015 0357   HDL 43 10/14/2015 0357   CHOLHDL 2.6 10/14/2015 0357   VLDL 13 10/14/2015 0357   LDLCALC 57 10/14/2015 0357    Additional studies/ records that were reviewed today include:    Cath 08/04/2015 Conclusion    Ost RPDA lesion, 40% stenosed.  Dist RCA lesion, 50% stenosed.  Prox Cx to Mid Cx lesion, 35% stenosed.  Ost Ramus to Ramus lesion, 60% stenosed.  Prox RCA to Mid RCA lesion, 25% stenosed.  Prox LAD to Mid LAD lesion, 90% stenosed.   1. Diffuse coronary artery disease with small caliber vessels. The proximal to mid LAD has a segmental severe stenosis with appearance of dissection.  Plan: Initial medical therapy. Anticoagulation per primary team. Given Q waves on Ecg and extensive akinesis on Echo I would not recommend PCI of the LAD unless she has refractory angina on medical therapy or evidence of viability.       Cath 10/10/2015 Conclusion    There is moderate to severe left ventricular systolic dysfunction.  Ost Ramus to Ramus lesion, 70% stenosed.  Ost LAD to Prox LAD lesion, 50% stenosed.  Prox LAD to Mid LAD lesion, 55% stenosed.  Prox Cx lesion, 20% stenosed.  Ost RCA lesion, 100%  stenosed.   Moderately severe LV dysfunction with an ejection fraction of 30-35% with hypokinesis of the anterior to anterolateral wall with akinesis to dyskinesis in the distal anterior apical and apical segment with suggestion of a large, smooth bordered, probable mural thrombus apically extending to the distal anterolateral wall and focal basal inferior hypocontractility.  Previously documented dissection of the proximal LAD with improved filling and residual narrowing of 50-55%; 70% smooth eccentric proximal ramus stenosis, mild 20% left circumflex stenosis, and new total occlusion of the RCA at its origin with extensive left to right collaterals supplying almost the entire RCA territory distally to near [place].  Recommendation: Angiograms will be reviewed with colleagues.  Since her catheterization of 08/04/2015, the RCA is now  occluded [place] which she has extensively collateralized via left injection.  She continues to have residual LAD proximal dissection, but there is TIMI-3 flow.  Smoking cessation is essential.  It appears that she has an extensive left ventricular mural thrombus.  An echocardiogram was ordered.  She will need to be on anticoagulation long-term.  In addition, she has reduced right femoral pulse and duplex imaging of her right lower extremity is recommended.  Recommended.       Echo 01/09/2016 LV EF: 40% -   45%  Study Conclusions  - Left ventricle: The cavity size was mildly dilated. Systolic   function was mildly to moderately reduced. The estimated ejection   fraction was in the range of 40% to 45%. Diffuse hypokinesis.   Akinesis of the mid-apical anteroseptal, apical inferior, apical   anterior, and apical myocardium. Doppler parameters are   consistent with a reversible restrictive pattern, indicative of   decreased left ventricular diastolic compliance and/or increased   left atrial pressure (grade 3 diastolic dysfunction). Doppler   parameters are  consistent with high ventricular filling pressure.   There was a 2.8 cm (L) x 1.2 cm (W), apicalthrombus. - Aortic valve: Transvalvular velocity was within the normal range.   There was [name] stenosis. There was mild regurgitation. - Mitral valve: Transvalvular velocity was within the normal range.   There was [name] evidence for stenosis. There was mild regurgitation. - Left atrium: The atrium was moderately dilated. - Right ventricle: The cavity size was normal. Wall thickness was   normal. Systolic function was normal. - Tricuspid valve: There was mild regurgitation. - Pulmonary arteries: Systolic pressure was within the normal   range. PA peak pressure: 26 mm Hg (S).      ASSESSMENT:    [name] diagnosis found.   PLAN:  In order of problems listed above:  1. ***    Medication Adjustments/Labs and Tests Ordered: Current medicines are reviewed at length with the patient today.  Concerns regarding medicines are outlined above.  Medication changes, Labs and Tests ordered today are listed in the Patient Instructions below. There are [name] Patient Instructions on file for this visit.   Ramond Dial, [place]  01/03/2017 8:00 AM    [facility] [address] [place], [place], [place]  [address] Phone: [phone]; Fax: [phone]

## 2017-03-04 ENCOUNTER — Other Ambulatory Visit: Payer: Self-pay | Admitting: Nurse Practitioner

## 2017-03-04 MED FILL — ?PANTOPRAZOLE SOD DR 40MG: 40 MG | 30 days supply | Qty: 30 | Fill #0

## 2017-03-04 MED FILL — LISINOPRIL 2.5 MG TABLET: 2.5 | 30 days supply | Qty: 30 | Fill #0

## 2017-03-04 MED FILL — ATORVASTATIN 80 MG TABLET: 80 | 30 days supply | Qty: 30 | Fill #0

## 2017-03-05 MED FILL — METOPROLOL SUCC ER 50 MG TA: 50 | 30 days supply | Qty: 30 | Fill #0

## 2017-03-05 NOTE — Telephone Encounter (Signed)
This is Dr. Crenshaw's pt 

## 2017-03-07 ENCOUNTER — Ambulatory Visit (INDEPENDENT_AMBULATORY_CARE_PROVIDER_SITE_OTHER): Payer: Self-pay | Admitting: Physician Assistant

## 2017-03-07 VITALS — BP 155/100 | HR 105 | Ht 65.0 in | Wt 160.0 lb

## 2017-03-07 DIAGNOSIS — Z720 Tobacco use: Secondary | ICD-10-CM

## 2017-03-07 DIAGNOSIS — I513 Intracardiac thrombosis, not elsewhere classified: Secondary | ICD-10-CM

## 2017-03-07 DIAGNOSIS — I2510 Atherosclerotic heart disease of native coronary artery without angina pectoris: Secondary | ICD-10-CM

## 2017-03-07 DIAGNOSIS — I255 Ischemic cardiomyopathy: Secondary | ICD-10-CM

## 2017-03-07 MED ORDER — METOPROLOL SUCCINATE ER 50 MG PO TB24: 50.0000 mg | ORAL_TABLET | Freq: Every day | ORAL | 5 refills | Status: DC

## 2017-03-07 NOTE — Progress Notes (Signed)
Cardiology Office Note    Date:  03/09/2017   ID:  Casey [name], DOB 14-Mar-1971, MRN [id number]  PCP:  Jaclyn ShaggyAmao, Enobong, MD  Cardiologist:  Dr. Jens Somrenshaw   Chief Complaint  Patient presents with  . Follow-up    seen for Dr. Jens Somrenshaw.     History of Present Illness:  Casey [name] is a 46 y.o. female with PMH of CAD and ICM.  She was previously admitted for abdominal pain, abdominal CT showed thrombus at the LV apex.  Echocardiogram in May 2017 showed EF 35-40% with anterior and apical wall motion abnormality, thrombus at the apex, mild MR.  Cardiac catheterization in May 2017 showed 90% proximal to mid LAD, 60% ramus, 50% RCA.  It was felt that she likely had a LAD territory infarction given the presence of LV thrombus and wall motion abnormality, herefore medical therapy was recommended.  She was placed on apixaban.  She was readmitted in July 2017, cardiac catheterization showed a 70% ramus, 50% followed by 55% LAD and occluded RCA, EF 30-35%.  Apical thrombus again noted.  Cardiac MRI in July 2017 showed EF 35%, large apical thrombus, mild mitral regurgitation.  Patient was seen by Dr. Cornelius Moraswen in October 2017, medical therapy was recommended.  Last echocardiogram in October 20, grade 3 DD, apical thrombus, mild aortic insufficiency, mitral regurgitation and moderate LAE.   She presents today for cardiology office evaluation.  She denies any exertional chest pain or shortness of breath.  There is no lower extremity edema, orthopnea or PND.  [facility] for her Eliquis.  Otherwise she has been doing well.  She can follow-up with Dr. Jens Somrenshaw in 6 months.  I did recommend repeat echocardiogram to assess if LV thrombus is still present.  She says she has not missed any Eliquis recently.  Unfortunately, she continues to smoke to this day, she says she smoked less than a pack.  I strongly advised her to stop smoking.    Past Medical History:    Diagnosis Date  . Anticoagulation adequate 10/14/2015  . CAD (coronary artery disease)   . Coronary atherosclerosis of native coronary artery, multivessel with plans for CABG to be followed   08/04/2015  . Ischemic cardiomyopathy   . UTI (urinary tract infection) 10/14/2015    Past Surgical History:  Procedure Laterality Date  . CARDIAC CATHETERIZATION N/A 08/04/2015   Procedure: Coronary/Graft Angiography;  Surgeon: Peter M SwazilandJordan, MD;  [facility];  Service: Cardiovascular;  Laterality: N/A;  . CARDIAC CATHETERIZATION N/A 10/10/2015   Procedure: Left Heart Cath and Coronary Angiography;  Surgeon: Lennette Biharihomas A Kelly, MD;  Location: [facility] INVASIVE CV LAB;  Service: Cardiovascular;  Laterality: N/A;  . CESAREAN SECTION      Current Medications: Outpatient Medications Prior to Visit  Medication Sig Dispense Refill  . apixaban (ELIQUIS) 5 MG TABS tablet Take 1 tablet (5 mg total) by mouth 2 (two) times daily. 180 tablet 1  . atorvastatin (LIPITOR) 80 MG tablet Take 1 tablet (80 mg total) by mouth daily. 30 tablet 0  . lisinopril (PRINIVIL,ZESTRIL) 2.5 MG tablet Take 1 tablet (2.5 mg total) by mouth daily. 30 tablet 9  . pantoprazole (PROTONIX) 40 MG tablet Take 1 tablet (40 mg total) by mouth daily. 30 tablet 0  . metoprolol succinate (TOPROL-XL) 25 MG 24 hr tablet Take 1 tablet (25 mg total) by mouth daily. Take with or immediately following a meal. 30 tablet 0  .  metoprolol succinate (TOPROL-XL) 50 MG 24 hr tablet TAKE 1 TABLET DAILY WITH OR IMMEDIATELY FOLLOWING A MEAL. 30 tablet 0   No facility-administered medications prior to visit.      Allergies:   Patient has no known allergies.   Social History   Socioeconomic History  . Marital status: Divorced    Spouse name: None  . Number of children: None  . Years of education: None  . Highest education level: None  Social Needs  . Financial resource strain: None  . Food insecurity - worry: None  . Food insecurity -  inability: None  . Transportation needs - medical: None  . Transportation needs - non-medical: None  Occupational History  . None  Tobacco Use  . Smoking status: Current Some Day Smoker    Packs/day: 0.50    Types: Cigarettes  . Smokeless tobacco: Never Used  Substance and Sexual Activity  . Alcohol use: No    Alcohol/week: 0.0 oz    Comment: Two drinks a night.  . Drug use: No    Comment: No history of cocaine use  . Sexual activity: None  Other Topics Concern  . None  Social History Narrative  . None     Family History:   Per patient, she does not know her father and her mother has already passed away, therefore, she does not know her FHx  ROS:   Please see the history of present illness.    ROS All other systems reviewed and are negative.   PHYSICAL EXAM:   VS:  BP (!) 155/100   Pulse (!) 105   Ht 5\' 5"  (1.651 m)   Wt 160 lb (72.6 kg)   BMI 26.63 kg/m    GEN: Well nourished, well developed, in no acute distress  HEENT: normal  Neck: no JVD, carotid bruits, or masses Cardiac: RRR; no murmurs, rubs, or gallops,no edema  Respiratory:  clear to auscultation bilaterally, normal work of breathing GI: soft, nontender, nondistended, + BS MS: no deformity or atrophy  Skin: warm and dry, no rash Neuro:  Alert and Oriented x 3, Strength and sensation are intact Psych: euthymic mood, full affect  Wt Readings from Last 3 Encounters:  03/07/17 160 lb (72.6 kg)  02/13/16 168 lb (76.2 kg)  01/16/16 160 lb (72.6 kg)      Studies/Labs Reviewed:   EKG:  EKG is ordered today.  The ekg ordered today demonstrates sinus tachycardia, heart rate 105, T wave inversion in lead V4 -V6  Recent Labs: No results found for requested labs within last 8760 hours.   Lipid Panel    Component Value Date/Time   CHOL 113 10/14/2015 0357   TRIG 67 10/14/2015 0357   HDL 43 10/14/2015 0357   CHOLHDL 2.6 10/14/2015 0357   VLDL 13 10/14/2015 0357   LDLCALC 57 10/14/2015 0357     Additional studies/ records that were reviewed today include:   Echo 01/09/2016 LV EF: 40% -   45%  ------------------------------------------------------------------- Study Conclusions  - Left ventricle: The cavity size was mildly dilated. Systolic   function was mildly to moderately reduced. The estimated ejection   fraction was in the range of 40% to 45%. Diffuse hypokinesis.   Akinesis of the mid-apical anteroseptal, apical inferior, apical   anterior, and apical myocardium. Doppler parameters are   consistent with a reversible restrictive pattern, indicative of   decreased left ventricular diastolic compliance and/or increased   left atrial pressure (grade 3 diastolic dysfunction). Doppler  parameters are consistent with high ventricular filling pressure.   There was a 2.8 cm (L) x 1.2 cm (W), apicalthrombus. - Aortic valve: Transvalvular velocity was within the normal range.   There was no stenosis. There was mild regurgitation. - Mitral valve: Transvalvular velocity was within the normal range.   There was no evidence for stenosis. There was mild regurgitation. - Left atrium: The atrium was moderately dilated. - Right ventricle: The cavity size was normal. Wall thickness was   normal. Systolic function was normal. - Tricuspid valve: There was mild regurgitation. - Pulmonary arteries: Systolic pressure was within the normal   range. PA peak pressure: 26 mm Hg (S).    ASSESSMENT:    1. Atherosclerosis of native coronary artery of native heart without angina pectoris   2. Mural thrombus of cardiac apex   3. Ischemic cardiomyopathy   4. Tobacco abuse      PLAN:  In order of problems listed above:  1. CAD: Denies any significant chest pain.  His cardiac MRI has shown significant anterior wall infarct.  She is not on aspirin due to the need for Eliquis.  2. Ischemic cardiomyopathy: EF 35%, appears to be euvolemic on physical exam.  Continue Toprol-XL and  lisinopril.  I have increased her Toprol-XL to 50 mg daily.  3. Apical thrombus: Continue on Eliquis.  I have filled out medication assistance form.  4. Tobacco abuse: Although she has cut back, she continues to smoke to this day.  I strongly advised her to quit smoking.   Medication Adjustments/Labs and Tests Ordered: Current medicines are reviewed at length with the patient today.  Concerns regarding medicines are outlined above.  Medication changes, Labs and Tests ordered today are listed in the Patient Instructions below. Patient Instructions  Medication Instructions:  INCREASE METOPROLOL SUCCINATE 50MG  DAILY If you need a refill on your cardiac medications before your next appointment, please call your pharmacy.  Testing/Procedures: Your physician has requested that you have an echocardiogram. Echocardiography is a painless test that uses sound waves to create images of your heart. It provides your doctor with information about the size and shape of your heart and how well your heart's chambers and valves are working. This procedure takes approximately one hour. There are no restrictions for this procedure.  Follow-Up: Your physician wants you to follow-up in: 6 MONTHS WITH DR CRENSHAW. You should receive a reminder letter in the mail two months in advance. If you do not receive a letter, please call our office April 2019 to schedule the June 2019 follow-up appointment.   Thank you for choosing [facility] at [employer], [place]  03/09/2017 10:02 AM    [facility] [address] [place], [place], [place]  [address] Phone: [phone]; Fax: [phone]

## 2017-03-07 NOTE — Patient Instructions (Signed)
Medication Instructions:  INCREASE METOPROLOL SUCCINATE 50MG  DAILY If you need a refill on your cardiac medications before your next appointment, please call your pharmacy.  Testing/Procedures: Your physician has requested that you have an echocardiogram. Echocardiography is a painless test that uses sound waves to create images of your heart. It provides your doctor with information about the size and shape of your heart and how well your heart's chambers and valves are working. This procedure takes approximately one hour. There are no restrictions for this procedure.  Follow-Up: Your physician wants you to follow-up in: 6 MONTHS WITH DR CRENSHAW. You should receive a reminder letter in the mail two months in advance. If you do not receive a letter, please call our office April 2019 to schedule the June 2019 follow-up appointment.   Thank you for choosing [facility] at [facility]!!

## 2017-03-09 ENCOUNTER — Encounter: Payer: Self-pay | Admitting: Physician Assistant

## 2017-03-18 ENCOUNTER — Ambulatory Visit (HOSPITAL_COMMUNITY): Payer: Medicaid Other | Attending: Cardiovascular Disease

## 2017-03-18 ENCOUNTER — Telehealth: Payer: Self-pay | Admitting: Cardiology

## 2017-03-18 ENCOUNTER — Other Ambulatory Visit: Payer: Self-pay

## 2017-03-18 DIAGNOSIS — I255 Ischemic cardiomyopathy: Secondary | ICD-10-CM | POA: Insufficient documentation

## 2017-03-18 DIAGNOSIS — I513 Intracardiac thrombosis, not elsewhere classified: Secondary | ICD-10-CM | POA: Diagnosis not present

## 2017-03-18 DIAGNOSIS — I2510 Atherosclerotic heart disease of native coronary artery without angina pectoris: Secondary | ICD-10-CM | POA: Diagnosis not present

## 2017-03-18 MED ORDER — APIXABAN 5 MG PO TABS: 5.0000 mg | ORAL_TABLET | Freq: Two times a day (BID) | ORAL | 1 refills | Status: DC

## 2017-03-18 MED ORDER — PERFLUTREN LIPID MICROSPHERE
1.0000 mL | INTRAVENOUS | Status: AC | PRN
  Administered 2017-03-18: 2 mL via INTRAVENOUS

## 2017-03-18 MED FILL — ELIQUIS 5 MG TABLET: 5 | 30 days supply | Qty: 60 | Fill #0

## 2017-03-18 NOTE — Telephone Encounter (Signed)
Spoke with pt, New script sent to the pharmacy  

## 2017-03-18 NOTE — Telephone Encounter (Signed)
°  New Prob  Requesting a new prescription for Eliquis 5 mg. States she has been taking this medication for over a year now. However, company was sending her the medication for free. Now that she has medicaid, she is in need of a prescription.

## 2017-04-24 ENCOUNTER — Telehealth: Payer: Self-pay | Admitting: Cardiology

## 2017-04-24 DIAGNOSIS — I255 Ischemic cardiomyopathy: Secondary | ICD-10-CM

## 2017-04-24 DIAGNOSIS — I25110 Atherosclerotic heart disease of native coronary artery with unstable angina pectoris: Secondary | ICD-10-CM

## 2017-04-24 MED ORDER — PANTOPRAZOLE SODIUM 40 MG PO TBEC: 40.0000 mg | DELAYED_RELEASE_TABLET | Freq: Every day | ORAL | 5 refills | Status: DC

## 2017-04-24 MED ORDER — METOPROLOL SUCCINATE ER 50 MG PO TB24: 50.0000 mg | ORAL_TABLET | Freq: Every day | ORAL | 5 refills | Status: DC

## 2017-04-24 MED ORDER — LISINOPRIL 2.5 MG PO TABS: 2.5000 mg | ORAL_TABLET | Freq: Every day | ORAL | 5 refills | Status: DC

## 2017-04-24 MED ORDER — ATORVASTATIN CALCIUM 80 MG PO TABS: 80.0000 mg | ORAL_TABLET | Freq: Every day | ORAL | 5 refills | Status: DC

## 2017-04-24 NOTE — Telephone Encounter (Signed)
°*  STAT* If patient is at the pharmacy, call can be transferred to refill team.   1. Which medications need to be refilled? (please list name of each medication and dose if known) Apixaban 5 mg, Atorvastatin 80 mg, Lisinopril 2.5 mg, Metoprolol Succinate 50 mg and Pantoprazole 40 mg.  2. Which pharmacy/location (including street and city if local pharmacy) is medication to be sent to? [facility] [place], [place] - [address]  3. Do they need a 30 day or 90 day supply? 30 day

## 2017-04-25 MED FILL — ATORVASTATIN 80 MG TABLET: 80 | 30 days supply | Qty: 30 | Fill #0

## 2017-04-25 MED FILL — !ELIQUIS 5 MG TABLET: 5 | 30 days supply | Qty: 60 | Fill #1

## 2017-04-25 MED FILL — METOPROLOL SUCCINATE ER 50: 50 | 30 days supply | Qty: 30 | Fill #0

## 2017-04-25 MED FILL — LISINOPRIL 2.5 MG TABLET: 2.5 | 30 days supply | Qty: 30 | Fill #0

## 2017-04-25 MED FILL — ?PANTOPRAZOLE SOD DR 40MG: 40 MG | 30 days supply | Qty: 30 | Fill #0

## 2017-05-09 ENCOUNTER — Ambulatory Visit: Payer: Self-pay | Attending: Family Medicine | Admitting: Family Medicine

## 2017-05-09 ENCOUNTER — Encounter: Payer: Self-pay | Admitting: Family Medicine

## 2017-05-09 VITALS — BP 107/75 | HR 105 | Temp 98.6°F | Resp 16 | Ht 65.0 in | Wt 156.5 lb

## 2017-05-09 DIAGNOSIS — R112 Nausea with vomiting, unspecified: Secondary | ICD-10-CM | POA: Insufficient documentation

## 2017-05-09 DIAGNOSIS — I255 Ischemic cardiomyopathy: Secondary | ICD-10-CM | POA: Insufficient documentation

## 2017-05-09 DIAGNOSIS — I2510 Atherosclerotic heart disease of native coronary artery without angina pectoris: Secondary | ICD-10-CM | POA: Insufficient documentation

## 2017-05-09 DIAGNOSIS — Z79899 Other long term (current) drug therapy: Secondary | ICD-10-CM | POA: Insufficient documentation

## 2017-05-09 DIAGNOSIS — Z9889 Other specified postprocedural states: Secondary | ICD-10-CM | POA: Insufficient documentation

## 2017-05-09 DIAGNOSIS — K219 Gastro-esophageal reflux disease without esophagitis: Secondary | ICD-10-CM | POA: Insufficient documentation

## 2017-05-09 MED ORDER — CETIRIZINE HCL 10 MG PO TABS: 10.0000 mg | ORAL_TABLET | Freq: Every day | ORAL | 1 refills | Status: DC

## 2017-05-09 MED ORDER — ONDANSETRON 4 MG PO TBDP
4.0000 mg | ORAL_TABLET | Freq: Once | ORAL | Status: AC
  Administered 2017-05-09: 4 mg via ORAL

## 2017-05-09 MED ORDER — PROMETHAZINE HCL 25 MG PO TABS: 25.0000 mg | ORAL_TABLET | Freq: Three times a day (TID) | ORAL | 0 refills | Status: DC | PRN

## 2017-05-09 MED FILL — ?CETIRIZINE HCL 10 MG TABLE: 10 | 30 days supply | Qty: 30 | Fill #0

## 2017-05-09 MED FILL — PROMETHAZINE 25 MG TABLET: 25 | 6 days supply | Qty: 20 | Fill #0

## 2017-05-09 NOTE — Patient Instructions (Signed)
Vomiting, Adult  Vomiting occurs when stomach contents are thrown up and out of the mouth. Many people notice nausea before vomiting. Vomiting can make you feel weak and dehydrated. Dehydration can make you tired and thirsty, cause you to have a dry mouth, and decrease how often you urinate. Older adults and people who have other diseases or a weak immune system are at higher risk for dehydration.It is important to treat vomiting as told by your health care provider.  Follow these instructions at home:  Follow your health care provider's instructions about how to care for yourself at home.  Eating and drinking  Follow these recommendations as told by your health care provider:   Take an oral rehydration solution (ORS). This is a drink that is sold at pharmacies and retail stores.   Eat bland, easy-to-digest foods in small amounts as you are able. These foods include bananas, applesauce, rice, lean meats, toast, and crackers.   Drink clear fluids in small amounts as you are able. Clear fluids include water, ice chips, low-calorie sports drinks, and fruit juice that has water added (diluted fruit juice).   Avoid fluids that contain a lot of sugar or caffeine.   Avoid alcohol and foods that are spicy or fatty.    General instructions     Wash your hands frequently with soap and water. If soap and water are not available, use hand sanitizer. Make sure that everyone in your household washes their hands frequently.   Take over-the-counter and prescription medicines only as told by your health care provider.   Watch your condition for any changes.   Keep all follow-up visits as told by your health care provider. This is important.  Contact a health care provider if:   You have a fever.   You are not able to keep fluids down.   Your vomiting gets worse.   You have new symptoms.   You feel light-headed or dizzy.   You have a headache.   You have muscle cramps.  Get help right away if:   You have pain in  your chest, neck, arm, or jaw.   You feel extremely weak or you faint.   You have persistent vomiting.   You have vomit that is bright red or looks like black coffee grounds.   You have stools that are bloody or black, or stools that look like tar.   You have severe pain, cramping, or bloating in your abdomen.   You have a severe headache, a stiff neck, or both.   You have a rash.   You have trouble breathing or you are breathing very quickly.   Your heart is beating very quickly.   Your skin feels cold and clammy.   You feel confused.   You have pain while urinating.   You have signs of dehydration, such as:  ? Dark urine, or very little or no urine.  ? Cracked lips.  ? Dry mouth.  ? Sunken eyes.  ? Sleepiness.  ? Weakness.  These symptoms may represent a serious problem that is an emergency. Do not wait to see if the symptoms will go away. Get medical help right away. Call your local emergency services (911 in the [place] Do not drive yourself to the hospital.  This information is not intended to replace advice given to you by your health care provider. Make sure you discuss any questions you have with your health care provider.  Document Released: 04/15/2015 Document Revised: 08/25/2015 Document   Reviewed: 11/23/2014  Elsevier Interactive Patient Education  2018 [employer]

## 2017-05-09 NOTE — Progress Notes (Signed)
zoPatient is here to establish care.   Patient stated she have stomach pain and it started a week already.  Patient had been vomiting and no appetite.

## 2017-05-09 NOTE — Progress Notes (Signed)
Subjective:  Patient ID: Casey [name], female    DOB: 07-Feb-1971  Age: 47 y.o. 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 Her last office visit was in 10/2015.  She has produced a lot of mucus with her nausea and vomiting as has not been able to tolerate anything orally but today had a breakfast of sausage and a couple of minutes after vomited her breakfast.  She also complains of feeling a knot in her right upper quadrant but denies abdominal pain or cramping. She has not had diarrhea but states her stools have been loose. She does have a history of GERD and has been taking Protonix but states her reflux symptoms are still present and she continues to burp  often. She endorses nasal congestion, postnasal drip and a slight cough but no sinus pressure or fever.  With regards to her cardiomyopathy she was seen by cardiology in 03/2017 at which time her Toprol-XL dose was increased and echocardiogram ordered.  Echocardiogram 03/18/2017 Study Conclusions  - Left ventricle: The cavity size was normal. Wall thickness was   increased in a pattern of mild LVH. Systolic function was   moderately to severely reduced. The estimated ejection fraction   was in the range of 30% to 35%. Akinesis of the   mid-apicalanteroseptal and apical myocardium. Doppler parameters   are consistent with abnormal left ventricular relaxation (grade 1   diastolic dysfunction).  Impressions:  - Moderately reduced LV function   There is an apical thrombus present.   Past Medical History:  Diagnosis Date  . Anticoagulation adequate 10/14/2015  . CAD (coronary artery disease)   . Coronary atherosclerosis of native  coronary artery, multivessel with plans for CABG to be followed   08/04/2015  . Ischemic cardiomyopathy   . UTI (urinary tract infection) 10/14/2015    Past Surgical History:  Procedure Laterality Date  . CARDIAC CATHETERIZATION N/A 08/04/2015   Procedure: Coronary/Graft Angiography;  Surgeon: Peter M Martinique, MD;  Location: [place] [facility];  Service: Cardiovascular;  Laterality: N/A;  . CARDIAC CATHETERIZATION N/A 10/10/2015   Procedure: Left Heart Cath and Coronary Angiography;  Surgeon: Troy Sine, MD;  Location: [place] [facility];  Service: Cardiovascular;  Laterality: N/A;  . CESAREAN SECTION      No Known Allergies   Outpatient Medications Prior to Visit  Medication Sig Dispense Refill  . apixaban (ELIQUIS) 5 MG TABS tablet Take 1 tablet (5 mg total) by mouth 2 (two) times daily. 180 tablet 1  . atorvastatin (LIPITOR) 80 MG tablet Take 1 tablet (80 mg total) by mouth daily. 30 tablet 5  . lisinopril (PRINIVIL,ZESTRIL) 2.5 MG tablet Take 1 tablet (2.5 mg total) by mouth daily. 30 tablet 5  . metoprolol succinate (TOPROL-XL) 50 MG 24 hr tablet Take 1 tablet (50 mg total) by mouth daily. Take with or immediately following a meal. 30 tablet 5  . pantoprazole (PROTONIX) 40 MG tablet Take 1 tablet (40 mg total) by mouth daily. 30 tablet 5   No facility-administered medications prior to visit.     ROS Review of Systems  Constitutional: Negative for activity change, appetite change and fatigue.  HENT: Positive for congestion. Negative for sinus pressure and  sore throat.   Eyes: Negative for visual disturbance.  Respiratory: Negative for cough, chest tightness, shortness of breath and wheezing.   Cardiovascular: Negative for chest pain and palpitations.  Gastrointestinal: Positive for nausea and vomiting. Negative for abdominal distention, abdominal pain and constipation.  Endocrine: Negative for polydipsia.  Genitourinary: Negative for dysuria and frequency.  Musculoskeletal:  Negative for arthralgias and back pain.  Skin: Negative for rash.  Neurological: Negative for tremors, light-headedness and numbness.  Hematological: Does not bruise/bleed easily.  Psychiatric/Behavioral: Negative for agitation and behavioral problems.    Objective:  BP 107/75 (BP Location: Left Arm, Patient Position: Sitting, Cuff Size: Normal)   Pulse (!) 105   Temp 98.6 F (37 C) (Oral)   Resp 16   Ht _0  (1.651 m)   Wt 156 lb 8 oz (71 kg)   SpO2 98%   BMI 26.04 kg/m   BP/Weight 05/09/2017 03/07/2017 70/35/0093  Systolic BP 818 299 371  Diastolic BP 75 696 62  Wt. (Lbs) 156.5 160 168  BMI 26.04 26.63 27.96      Physical Exam  Constitutional: She is oriented to person, place, and time. She appears well-developed and well-nourished.  Slightly dehydrated  Cardiovascular: Normal heart sounds and intact distal pulses. Tachycardia present.  No murmur heard. Pulmonary/Chest: Effort normal and breath sounds normal. She has no wheezes. She has no rales. She exhibits no tenderness.  Abdominal: Soft. Bowel sounds are normal. She exhibits no distension and no mass. There is no tenderness.  Musculoskeletal: Normal range of motion.  Neurological: She is alert and oriented to person, place, and time.  Skin: Skin is warm and dry.  Psychiatric: She has a normal mood and affect.     Assessment & Plan:   1. Atherosclerosis of native coronary artery of native heart without angina pectoris Continue atorvastatin Line low-cholesterol diet  2. Cardiomyopathy, ischemic EF 30-35%, moderately to severely reduced systolic function, akinesis of the mid apical anteroseptal and apical myocardium, apical thrombus from echo of 03/2017 Euvolemic at this time Continue metoprolol, ACE inhibitor  3. Nausea and vomiting, intractability of vomiting not specified, unspecified vomiting type Unknown etiology Possibly viral gastroenteritis Discussed the BRAT diet, increase fluid intake.  Take small  portions frequently rather than huge meals at once. We will also check hepatitis A - ondansetron (ZOFRAN-ODT) disintegrating tablet 4 mg - promethazine (PHENERGAN) 25 MG tablet; Take 1 tablet (25 mg total) by mouth every 8 (eight) hours as needed for nausea or vomiting.  Dispense: 20 tablet; Refill: 0 - CMP14+EGFR  4. Gastroesophageal reflux disease without esophagitis Uncontrolled If GI symptoms resolve within the next week she has been advised to hold off on Protonix for the next 2 weeks prior to her visit so a H. pylori breath test can be performed   Meds ordered this encounter  Medications  . ondansetron (ZOFRAN-ODT) disintegrating tablet 4 mg  . promethazine (PHENERGAN) 25 MG tablet    Sig: Take 1 tablet (25 mg total) by mouth every 8 (eight) hours as needed for nausea or vomiting.    Dispense:  20 tablet    Refill:  0    Follow-up: Return in about 3 weeks (around 05/30/2017) for follow up on GERD and abdominal symptoms.   Charlott Rakes MD

## 2017-05-10 LAB — CMP14+EGFR
A/G RATIO: 1.3 (ref 1.2–2.2)
ALK PHOS: 109 IU/L (ref 39–117)
ALT: 63 IU/L — ABNORMAL HIGH (ref 0–32)
AST: 76 IU/L — AB (ref 0–40)
Albumin: 3.9 g/dL (ref 3.5–5.5)
BILIRUBIN TOTAL: 0.2 mg/dL (ref 0.0–1.2)
BUN/Creatinine Ratio: 6 — ABNORMAL LOW (ref 9–23)
BUN: 4 mg/dL — ABNORMAL LOW (ref 6–24)
CALCIUM: 9 mg/dL (ref 8.7–10.2)
CHLORIDE: 101 mmol/L (ref 96–106)
CO2: 24 mmol/L (ref 20–29)
Creatinine, Ser: 0.64 mg/dL (ref 0.57–1.00)
GFR calc Af Amer: 124 mL/min/{1.73_m2} (ref >59)
GFR, EST NON AFRICAN AMERICAN: 107 mL/min/{1.73_m2} (ref >59)
GLOBULIN, TOTAL: 3 g/dL (ref 1.5–4.5)
Glucose: 95 mg/dL (ref 65–99)
POTASSIUM: 4 mmol/L (ref 3.5–5.2)
SODIUM: 142 mmol/L (ref 134–144)
Total Protein: 6.9 g/dL (ref 6.0–8.5)

## 2017-05-10 LAB — HEPATITIS A ANTIBODY, IGM: HEP A IGM: NEGATIVE

## 2017-05-16 ENCOUNTER — Telehealth: Payer: Self-pay

## 2017-05-16 NOTE — Telephone Encounter (Signed)
Patient was called and informed to contact office for lab results.   If patient returns phone call please inform patient Her labs are negative for hepatitis A but reveals slightly elevated liver enzymes which are lower compared to severe elevation she has had in the past. Please advise her to keep upcoming appointment but if her symptoms do not resolve she might have to go to the emergency room.

## 2017-06-03 MED FILL — !ELIQUIS 5MG TABLET: 5 | 30 days supply | Qty: 60 | Fill #2

## 2017-08-06 MED FILL — !ELIQUIS 5MG TABLET: 5 | 30 days supply | Qty: 60 | Fill #3

## 2017-08-07 ENCOUNTER — Encounter: Payer: Self-pay | Admitting: Family Medicine

## 2017-08-07 ENCOUNTER — Ambulatory Visit: Payer: Self-pay | Attending: Family Medicine | Admitting: Family Medicine

## 2017-08-07 VITALS — BP 96/67 | HR 108 | Temp 97.9°F | Ht 65.0 in | Wt 161.6 lb

## 2017-08-07 DIAGNOSIS — Z9889 Other specified postprocedural states: Secondary | ICD-10-CM | POA: Insufficient documentation

## 2017-08-07 DIAGNOSIS — K5909 Other constipation: Secondary | ICD-10-CM

## 2017-08-07 DIAGNOSIS — I2510 Atherosclerotic heart disease of native coronary artery without angina pectoris: Secondary | ICD-10-CM | POA: Insufficient documentation

## 2017-08-07 DIAGNOSIS — K5900 Constipation, unspecified: Secondary | ICD-10-CM | POA: Insufficient documentation

## 2017-08-07 DIAGNOSIS — I513 Intracardiac thrombosis, not elsewhere classified: Secondary | ICD-10-CM | POA: Insufficient documentation

## 2017-08-07 DIAGNOSIS — Z09 Encounter for follow-up examination after completed treatment for conditions other than malignant neoplasm: Secondary | ICD-10-CM | POA: Insufficient documentation

## 2017-08-07 DIAGNOSIS — K219 Gastro-esophageal reflux disease without esophagitis: Secondary | ICD-10-CM | POA: Insufficient documentation

## 2017-08-07 DIAGNOSIS — Z87891 Personal history of nicotine dependence: Secondary | ICD-10-CM | POA: Insufficient documentation

## 2017-08-07 DIAGNOSIS — Z79899 Other long term (current) drug therapy: Secondary | ICD-10-CM | POA: Insufficient documentation

## 2017-08-07 DIAGNOSIS — I255 Ischemic cardiomyopathy: Secondary | ICD-10-CM | POA: Insufficient documentation

## 2017-08-07 DIAGNOSIS — Z7901 Long term (current) use of anticoagulants: Secondary | ICD-10-CM | POA: Insufficient documentation

## 2017-08-07 MED ORDER — PANTOPRAZOLE SODIUM 40 MG PO TBEC: 40.0000 mg | DELAYED_RELEASE_TABLET | Freq: Every day | ORAL | 5 refills | Status: DC

## 2017-08-07 MED ORDER — POLYETHYLENE GLYCOL 3350 17 GM/SCOOP PO POWD: 17.0000 g | Freq: Every day | ORAL | 1 refills | Status: DC

## 2017-08-07 MED FILL — PANTOPRAZOLE SOD DR 40 MG T: 40 | 30 days supply | Qty: 30 | Fill #0

## 2017-08-07 MED FILL — POLYETHYLENE GLYCOL 3350 PO: 30 days supply | Qty: 510 | Fill #0

## 2017-08-07 NOTE — Patient Instructions (Signed)

## 2017-08-07 NOTE — Progress Notes (Signed)
Subjective:  Patient ID: Casey [name], female    DOB: 09-25-1970  Age: 47 y.o. 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 At her last office visit she had complained of excessive burping despite taking her Protonix and also feeling a knot in her abdomen.  The plan had been to hold off on Protonix for 2 weeks and perform a H. pylori breath test however she never kept that appointment that she states her aunt passed away and she had to take a to [place]. She has been off Protonix and continues to complain of reflux symptoms, constipation with hard bowel movements even though she moves her bowels once to twice a day.  She also feels a knot intermittently in her abdomen but denies nausea or vomiting.  With regards to her cardiac symptoms she denies shortness of breath, pedal edema or orthopnea.  She is currently in the process of completing her paperwork to assist with the medication assistance program for her Eliquis.  She denies bruising or bleeding on Eliquis and will need to make an appointment to see cardiology for follow-up next month. She is not up-to-date on her Pap smear or mammogram.  Past Medical History:  Diagnosis Date  . Anticoagulation adequate 10/14/2015  . CAD (coronary artery disease)   . Coronary atherosclerosis of native coronary artery, multivessel with plans for CABG to be followed   08/04/2015  . Ischemic cardiomyopathy   . UTI (urinary tract infection) 10/14/2015    Past Surgical History:  Procedure Laterality Date  . CARDIAC CATHETERIZATION N/A 08/04/2015   Procedure: Coronary/Graft Angiography;  Surgeon: Peter M Swaziland, MD;  Location: [facility] INVASIVE CV LAB;  Service: Cardiovascular;  Laterality: N/A;  . CARDIAC CATHETERIZATION N/A  10/10/2015   Procedure: Left Heart Cath and Coronary Angiography;  Surgeon: Lennette Bihari, MD;  Location: [facility] INVASIVE CV LAB;  Service: Cardiovascular;  Laterality: N/A;  . CESAREAN SECTION      [name] Known Allergies   Outpatient Medications Prior to Visit  Medication Sig Dispense Refill  . apixaban (ELIQUIS) 5 MG TABS tablet Take 1 tablet (5 mg total) by mouth 2 (two) times daily. 180 tablet 1  . atorvastatin (LIPITOR) 80 MG tablet Take 1 tablet (80 mg total) by mouth daily. 30 tablet 5  . cetirizine (ZYRTEC) 10 MG tablet Take 1 tablet (10 mg total) by mouth daily. 30 tablet 1  . lisinopril (PRINIVIL,ZESTRIL) 2.5 MG tablet Take 1 tablet (2.5 mg total) by mouth daily. 30 tablet 5  . metoprolol succinate (TOPROL-XL) 50 MG 24 hr tablet Take 1 tablet (50 mg total) by mouth daily. Take with or immediately following a meal. 30 tablet 5  . promethazine (PHENERGAN) 25 MG tablet Take 1 tablet (25 mg total) by mouth every 8 (eight) hours as needed for nausea or vomiting. 20 tablet 0  . pantoprazole (PROTONIX) 40 MG tablet Take 1 tablet (40 mg total) by mouth daily. (Patient not taking: Reported on 08/07/2017) 30 tablet 5   [name] facility-administered medications prior to visit.     ROS Review of Systems  Constitutional: Negative for activity change, appetite change and fatigue.  HENT: Negative for congestion, sinus pressure and sore throat.   Eyes: Negative for visual disturbance.  Respiratory: Negative for cough, chest tightness, shortness of  breath and wheezing.   Cardiovascular: Negative for chest pain and palpitations.  Gastrointestinal: Negative for abdominal distention, abdominal pain and constipation.  Endocrine: Negative for polydipsia.  Genitourinary: Negative for dysuria and frequency.  Musculoskeletal: Negative for arthralgias and back pain.  Skin: Negative for rash.  Neurological: Negative for tremors, light-headedness and numbness.  Hematological: Does not bruise/bleed easily.    Psychiatric/Behavioral: Negative for agitation and behavioral problems.    Objective:  BP 96/67   Pulse (!) 108   Temp 97.9 F (36.6 C) (Oral)   Ht 5\' 5"  (1.651 m)   Wt 161 lb 9.6 oz (73.3 kg)   SpO2 97%   BMI 26.89 kg/m   BP/Weight 08/07/2017 05/09/2017 03/07/2017  Systolic BP 96 107 155  Diastolic BP 67 75 100  Wt. (Lbs) 161.6 156.5 160  BMI 26.89 26.04 26.63      Physical Exam  Constitutional: She is oriented to person, place, and time. She appears well-developed and well-nourished.  Cardiovascular: Normal heart sounds and intact distal pulses. Tachycardia present.  [name] murmur heard. Pulmonary/Chest: Effort normal and breath sounds normal. She has [name] wheezes. She has [name] rales. She exhibits [name] tenderness.  Abdominal: Soft. Bowel sounds are normal. She exhibits [name] distension and [name] mass. There is [name] tenderness.  Musculoskeletal: Normal range of motion.  Neurological: She is alert and oriented to person, place, and time.  Skin: Skin is warm and dry.  Psychiatric: She has a normal mood and affect.     Assessment & Plan:   1. Cardiomyopathy, ischemic EF 30 to 35% Continue medications Risk factor modification  2. Mural thrombus of cardiac apex On anticoagulation with Eliquis  3. Other constipation Placed on MiraLAX Increase fiber intake  4. Gastroesophageal reflux disease without esophagitis H. pylori breath test today and has been advised to resume Protonix - H. pylori breath test   Meds ordered this encounter  Medications  . pantoprazole (PROTONIX) 40 MG tablet    Sig: Take 1 tablet (40 mg total) by mouth daily.    Dispense:  30 tablet    Refill:  5  . polyethylene glycol powder (GLYCOLAX/MIRALAX) powder    Sig: Take 17 g by mouth daily.    Dispense:  3350 g    Refill:  1    Follow-up: Return in about 1 month (around 09/07/2017) for Complete physical exam.   Hoy Register MD

## 2017-08-08 LAB — H. PYLORI BREATH TEST: H PYLORI BREATH TEST: POSITIVE — AB

## 2017-08-09 ENCOUNTER — Other Ambulatory Visit: Payer: Self-pay | Admitting: Family Medicine

## 2017-08-09 MED ORDER — AMOXICILLIN 500 MG PO CAPS: 1000.0000 mg | ORAL_CAPSULE | Freq: Two times a day (BID) | ORAL | 0 refills | Status: DC

## 2017-08-09 MED ORDER — CLARITHROMYCIN 500 MG PO TABS: 500.0000 mg | ORAL_TABLET | Freq: Two times a day (BID) | ORAL | 0 refills | Status: DC

## 2017-08-12 ENCOUNTER — Telehealth: Payer: Self-pay

## 2017-08-12 NOTE — Telephone Encounter (Signed)
Patient was called and informed of lab results. 

## 2017-09-02 MED FILL — CLARITHROMYCIN 500 MG TAB: 500 | 14 days supply | Qty: 28 | Fill #0

## 2017-09-02 MED FILL — AMOXICILLIN 500 MG CAPSULE: 500 | 14 days supply | Qty: 56 | Fill #0

## 2017-09-16 ENCOUNTER — Ambulatory Visit: Payer: Self-pay | Attending: Family Medicine | Admitting: Family Medicine

## 2017-09-16 ENCOUNTER — Encounter: Payer: Self-pay | Admitting: Family Medicine

## 2017-09-16 VITALS — BP 97/64 | HR 93 | Temp 98.1°F | Ht 65.0 in | Wt 164.0 lb

## 2017-09-16 DIAGNOSIS — Z0000 Encounter for general adult medical examination without abnormal findings: Secondary | ICD-10-CM | POA: Insufficient documentation

## 2017-09-16 DIAGNOSIS — Z124 Encounter for screening for malignant neoplasm of cervix: Secondary | ICD-10-CM

## 2017-09-16 DIAGNOSIS — I255 Ischemic cardiomyopathy: Secondary | ICD-10-CM | POA: Insufficient documentation

## 2017-09-16 DIAGNOSIS — Z87440 Personal history of urinary (tract) infections: Secondary | ICD-10-CM | POA: Insufficient documentation

## 2017-09-16 DIAGNOSIS — Z87891 Personal history of nicotine dependence: Secondary | ICD-10-CM | POA: Insufficient documentation

## 2017-09-16 DIAGNOSIS — Z1231 Encounter for screening mammogram for malignant neoplasm of breast: Secondary | ICD-10-CM

## 2017-09-16 DIAGNOSIS — Z7901 Long term (current) use of anticoagulants: Secondary | ICD-10-CM | POA: Insufficient documentation

## 2017-09-16 DIAGNOSIS — Z1239 Encounter for other screening for malignant neoplasm of breast: Secondary | ICD-10-CM

## 2017-09-16 DIAGNOSIS — Z79899 Other long term (current) drug therapy: Secondary | ICD-10-CM | POA: Insufficient documentation

## 2017-09-16 DIAGNOSIS — I2510 Atherosclerotic heart disease of native coronary artery without angina pectoris: Secondary | ICD-10-CM | POA: Insufficient documentation

## 2017-09-16 MED ORDER — TETANUS-DIPHTH-ACELL PERTUSSIS 5-2.5-18.5 LF-MCG/0.5 IM SUSP: 0.5000 mL | INTRAMUSCULAR | 0 refills | Status: DC

## 2017-09-16 NOTE — Addendum Note (Signed)
Addended by: Ronette Deter on: 09/16/2017 04:22 PM   Modules accepted: Orders

## 2017-09-16 NOTE — Progress Notes (Signed)
Subjective:  Patient ID: Casey [name], female    DOB: 1971-02-24  Age: 47 y.o. 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 She is unable to recall her last Pap smear and has never had a mammogram.  Past Medical History:  Diagnosis Date  . Anticoagulation adequate 10/14/2015  . CAD (coronary artery disease)   . Coronary atherosclerosis of native coronary artery, multivessel with plans for CABG to be followed   08/04/2015  . Ischemic cardiomyopathy   . UTI (urinary tract infection) 10/14/2015    Past Surgical History:  Procedure Laterality Date  . CARDIAC CATHETERIZATION N/A 08/04/2015   Procedure: Coronary/Graft Angiography;  Surgeon: Peter M Swaziland, MD;  Location: [facility] INVASIVE CV LAB;  Service: Cardiovascular;  Laterality: N/A;  . CARDIAC CATHETERIZATION N/A 10/10/2015   Procedure: Left Heart Cath and Coronary Angiography;  Surgeon: Lennette Bihari, MD;  Location: [facility] INVASIVE CV LAB;  Service: Cardiovascular;  Laterality: N/A;  . CESAREAN SECTION      [name] Known Allergies   Outpatient Medications Prior to Visit  Medication Sig Dispense Refill  . apixaban (ELIQUIS) 5 MG TABS tablet Take 1 tablet (5 mg total) by mouth 2 (two) times daily. 180 tablet 1  . atorvastatin (LIPITOR) 80 MG tablet Take 1 tablet (80 mg total) by mouth daily. 30 tablet 5  . lisinopril (PRINIVIL,ZESTRIL) 2.5 MG tablet Take 1 tablet (2.5 mg total) by mouth daily. 30 tablet 5  . metoprolol succinate (TOPROL-XL) 50 MG 24 hr tablet Take 1 tablet (50 mg total) by mouth daily. Take with or immediately following a meal. 30 tablet 5  . pantoprazole (PROTONIX) 40 MG tablet Take 1 tablet (40 mg total) by mouth daily. 30 tablet 5  . promethazine (PHENERGAN) 25 MG tablet Take 1  tablet (25 mg total) by mouth every 8 (eight) hours as needed for nausea or vomiting. 20 tablet 0  . amoxicillin (AMOXIL) 500 MG capsule Take 2 capsules (1,000 mg total) by mouth 2 (two) times daily. (Patient not taking: Reported on 09/16/2017) 56 capsule 0  . cetirizine (ZYRTEC) 10 MG tablet Take 1 tablet (10 mg total) by mouth daily. (Patient not taking: Reported on 09/16/2017) 30 tablet 1  . clarithromycin (BIAXIN) 500 MG tablet Take 1 tablet (500 mg total) by mouth 2 (two) times daily. (Patient not taking: Reported on 09/16/2017) 28 tablet 0  . polyethylene glycol powder (GLYCOLAX/MIRALAX) powder Take 17 g by mouth daily. (Patient not taking: Reported on 09/16/2017) 3350 g 1   [name] facility-administered medications prior to visit.     ROS Review of Systems  Constitutional: Negative for activity change, appetite change and fatigue.  HENT: Negative for congestion, sinus pressure and sore throat.   Eyes: Negative for visual disturbance.  Respiratory: Negative for cough, chest tightness, shortness of breath and wheezing.   Cardiovascular: Negative for chest pain and palpitations.  Gastrointestinal: Negative for abdominal distention, abdominal pain and constipation.  Endocrine: Negative for polydipsia.  Genitourinary: Negative for dysuria and frequency.  Musculoskeletal: Negative for arthralgias and back pain.  Skin: Negative for rash.  Neurological: Negative for tremors, light-headedness and numbness.  Hematological: Does not bruise/bleed easily.  Psychiatric/Behavioral: Negative for agitation and behavioral problems.    Objective:  BP 97/64   Pulse 93  Temp 98.1 F (36.7 C) (Oral)   Ht 5\' 5"  (1.651 m)   Wt 164 lb (74.4 kg)   SpO2 99%   BMI 27.29 kg/m   BP/Weight 09/16/2017 08/07/2017 05/09/2017  Systolic BP 97 96 107  Diastolic BP 64 67 75  Wt. (Lbs) 164 161.6 156.5  BMI 27.29 26.89 26.04      Physical Exam  Constitutional: She is oriented to person, place, and time. She appears  well-developed and well-nourished. [name] distress.  HENT:  Head: Normocephalic.  Right Ear: External ear normal.  Left Ear: External ear normal.  Nose: Nose normal.  Mouth/Throat: Oropharynx is clear and moist.  Eyes: Pupils are equal, round, and reactive to light. Conjunctivae and EOM are normal.  Neck: Normal range of motion. [name] JVD present.  Cardiovascular: Normal rate, regular rhythm, normal heart sounds and intact distal pulses. Exam reveals [name] gallop.  [name] murmur heard. Pulmonary/Chest: Effort normal and breath sounds normal. [name] respiratory distress. She has [name] wheezes. She has [name] rales. She exhibits [name] tenderness. Right breast exhibits [name] mass and [name] tenderness. Left breast exhibits [name] mass and [name] tenderness.  Abdominal: Soft. Bowel sounds are normal. She exhibits [name] distension and [name] mass. There is [name] tenderness.  Genitourinary:  Genitourinary Comments: External genitalia, vagina, cervix-normal  Musculoskeletal: Normal range of motion. She exhibits [name] edema or tenderness.  Neurological: She is alert and oriented to person, place, and time. She has normal reflexes.  Skin: Skin is warm and dry. She is not diaphoretic.  Psychiatric: She has a normal mood and affect.     Assessment & Plan:   1. Annual physical exam Counseled on 150 minutes of exercise per week, healthy eating (including decreased daily intake of saturated fats, cholesterol, added sugars, sodium), STI prevention, routine healthcare maintenance. - Lipid panel - HIV antibody (with reflex)  2. Screening for cervical cancer - Cytology - PAP([place])  3. Screening for breast cancer - MM Digital Screening; Future   [name] orders of the defined types were placed in this encounter.   Follow-up: Return in about 3 months (around 12/17/2017) for Follow-up of chronic medical conditions.   Hoy Register MD

## 2017-09-16 NOTE — Patient Instructions (Signed)

## 2017-09-17 LAB — LIPID PANEL
CHOL/HDL RATIO: 3.9 ratio (ref 0.0–4.4)
Cholesterol, Total: 194 mg/dL (ref 100–199)
HDL: 50 mg/dL (ref >39)
LDL CALC: 129 mg/dL — AB (ref 0–99)
TRIGLYCERIDES: 77 mg/dL (ref 0–149)
VLDL Cholesterol Cal: 15 mg/dL (ref 5–40)

## 2017-09-17 LAB — HIV ANTIBODY (ROUTINE TESTING W REFLEX): HIV SCREEN 4TH GENERATION: NONREACTIVE

## 2017-09-18 LAB — CYTOLOGY - PAP
CHLAMYDIA, DNA PROBE: NEGATIVE
Diagnosis: NEGATIVE
HPV (WINDOPATH): NOT DETECTED
NEISSERIA GONORRHEA: NEGATIVE

## 2017-09-18 MED FILL — $BOOSTRIX VACCINE SYRINGE: 5-2.5-18.5 | 1 days supply | Qty: 1 | Fill #0

## 2017-09-19 ENCOUNTER — Other Ambulatory Visit: Payer: Self-pay | Admitting: Family Medicine

## 2017-09-19 DIAGNOSIS — A5901 Trichomonal vulvovaginitis: Secondary | ICD-10-CM | POA: Insufficient documentation

## 2017-09-19 MED ORDER — METRONIDAZOLE 500 MG PO TABS
2000.0000 mg | ORAL_TABLET | Freq: Once | ORAL | 0 refills | Status: AC
Start: 2017-09-19 — End: 2017-09-19

## 2017-09-24 ENCOUNTER — Telehealth: Payer: Self-pay

## 2017-09-24 NOTE — Telephone Encounter (Signed)
-----   Message from Hoy Register, MD sent at 09/19/2017 12:31 PM EDT ----- Negative for malignancy however cultures revealed presence of trichomonas vaginalis which is an STD and I have sent a prescription for metronidazole to her pharmacy and her partner would need to be treated.  She will need to retest in 3 months

## 2017-09-24 NOTE — Telephone Encounter (Signed)
Patient was called and informed of all lab results. 

## 2017-09-30 NOTE — Progress Notes (Signed)
Cardiology Office Note   Date:  10/01/2017   ID:  Casey [name], DOB [birth year], MRN [id number]  PCP:  Hoy Register, MD  Cardiologist:  [facility]  Chief Complaint  Patient presents with  . Follow-up     History of Present Illness: Casey [name] is a 47 y.o. female who presents for ongoing assessment and management of CAD with hx of ICM. She had an LAD infarct and LV thrombus per cath in 08/2015. This revealed a 90% proximal to mid LAD 60% ramus, 50% RCA. Repeat cardiac cath in July of 2017 demonstrated 70% ramus, occluded RCA, and 50%-55% LAD with EF of 35% and a large apical thrombus. She is now on Eliquis. She unfortunately continues to smoke. On last office visit with Dr.Crenshaw, on 03/07/2017 at which time Toprol was increased to 50 mg daily.   She is here without complaints today of recurrent chest pain, dyspnea, or fatigue. She denies melena, hemoptysis, or epistaxis on Eliquis. Medically compliant.   Past Medical History:  Diagnosis Date  . Anticoagulation adequate 10/14/2015  . CAD (coronary artery disease)   . Coronary atherosclerosis of native coronary artery, multivessel with plans for CABG to be followed   08/04/2015  . Ischemic cardiomyopathy   . UTI (urinary tract infection) 10/14/2015    Past Surgical History:  Procedure Laterality Date  . CARDIAC CATHETERIZATION N/A 08/04/2015   Procedure: Coronary/Graft Angiography;  Surgeon: Peter M Swaziland, MD;  Location: [facility] INVASIVE CV LAB;  Service: Cardiovascular;  Laterality: N/A;  . CARDIAC CATHETERIZATION N/A 10/10/2015   Procedure: Left Heart Cath and Coronary Angiography;  Surgeon: Lennette Bihari, MD;  Location: [facility] INVASIVE CV LAB;  Service: Cardiovascular;  Laterality: N/A;  . CESAREAN SECTION       Current Outpatient Medications  Medication Sig Dispense Refill  . amoxicillin (AMOXIL) 500 MG capsule Take 2 capsules (1,000 mg total) by mouth 2 (two) times daily. 56 capsule 0  . apixaban (ELIQUIS) 5 MG TABS tablet  Take 1 tablet (5 mg total) by mouth 2 (two) times daily. 180 tablet 3  . atorvastatin (LIPITOR) 80 MG tablet Take 1 tablet (80 mg total) by mouth daily. 30 tablet 5  . cetirizine (ZYRTEC) 10 MG tablet Take 1 tablet (10 mg total) by mouth daily. 30 tablet 1  . clarithromycin (BIAXIN) 500 MG tablet Take 1 tablet (500 mg total) by mouth 2 (two) times daily. 28 tablet 0  . lisinopril (PRINIVIL,ZESTRIL) 2.5 MG tablet Take 1 tablet (2.5 mg total) by mouth daily. 30 tablet 5  . metoprolol succinate (TOPROL-XL) 50 MG 24 hr tablet Take 1 tablet (50 mg total) by mouth daily. Take with or immediately following a meal. 30 tablet 5  . pantoprazole (PROTONIX) 40 MG tablet Take 1 tablet (40 mg total) by mouth daily. 30 tablet 5  . promethazine (PHENERGAN) 25 MG tablet Take 1 tablet (25 mg total) by mouth every 8 (eight) hours as needed for nausea or vomiting. 20 tablet 0  . Tdap (BOOSTRIX) 5-2.5-18.5 LF-MCG/0.5 injection Inject 0.5 mLs into the muscle as directed. 0.5 mL 0  . ezetimibe (ZETIA) 10 MG tablet Take 1 tablet (10 mg total) by mouth daily. 90 tablet 3   [name] current facility-administered medications for this visit.     Allergies:   Patient has [name] known allergies.    Social History:  The patient  reports that she has been smoking cigarettes.  She has been smoking about 0.50 packs per day. She has never used smokeless  tobacco. She reports that she drinks alcohol. She reports that she does not use drugs.   Family History:  The patient's family history is not on file.    ROS: All other systems are reviewed and negative. Unless otherwise mentioned in H&P    PHYSICAL EXAM: VS:  BP 122/78   Pulse 100   Ht 5\' 5"  (1.651 m)   Wt 167 lb 3.2 oz (75.8 kg)   BMI 27.82 kg/m  , BMI Body mass index is 27.82 kg/m. GEN: Well nourished, well developed, in [name] acute distress  HEENT: normal  Neck: [name] JVD, carotid bruits, or masses Cardiac: RRR; [name] murmurs, rubs, or gallops,[name] edema  Respiratory:  clear to  auscultation bilaterally, normal work of breathing GI: soft, nontender, nondistended, + BS MS: [name] deformity or atrophy  Skin: warm and dry, [name] rash Neuro:  Strength and sensation are intact Psych: euthymic mood, full affect   EKG: Not completed during this office visit.   Recent Labs: 05/09/2017: ALT 63; BUN 4; Creatinine, Ser 0.64; Potassium 4.0; Sodium 142    Lipid Panel    Component Value Date/Time   CHOL 194 09/16/2017 1031   TRIG 77 09/16/2017 1031   HDL 50 09/16/2017 1031   CHOLHDL 3.9 09/16/2017 1031   CHOLHDL 2.6 10/14/2015 0357   VLDL 13 10/14/2015 0357   LDLCALC 129 (H) 09/16/2017 1031      Wt Readings from Last 3 Encounters:  10/01/17 167 lb 3.2 oz (75.8 kg)  09/16/17 164 lb (74.4 kg)  08/07/17 161 lb 9.6 oz (73.3 kg)      Other studies Reviewed: Echocardiogram 2016/02/05  Left ventricle: The cavity size was mildly dilated. Systolic function was mildly to moderately reduced. The estimated ejection fraction was in the range of 40% to 45%. Diffuse hypokinesis. Akinesis of the mid-apical anteroseptal, apical inferior, apical anterior, and apical myocardium. Doppler parameters are consistent with a reversible restrictive pattern, indicative of decreased left ventricular diastolic compliance and/or increased left atrial pressure (grade 3 diastolic dysfunction). Doppler parameters are consistent with high ventricular filling pressure. There was a 2.8 cm (L) x 1.2 cm (W), apicalthrombus. - Aortic valve: Transvalvular velocity was within the normal range. There was [name] stenosis. There was mild regurgitation. - Mitral valve: Transvalvular velocity was within the normal range. There was [name] evidence for stenosis. There was mild regurgitation. - Left atrium: The atrium was moderately dilated. - Right ventricle: The cavity size was normal. Wall thickness was normal. Systolic function was normal. - Tricuspid valve: There was mild regurgitation. -  Pulmonary arteries: Systolic pressure was within the normal range. PA peak pressure: 26 mm Hg (S).     ASSESSMENT AND PLAN:  1. CAD: She is doing well and without complaints. She is medically compliant. Will continue her on regimen metoprolol, lisinopril.   2 Hypercholesterolemia: Recent labs revealed LDL of 128. She remains on atorvastatin 80 mg daily, will add Zetia. She is counseled on a low cholesterol diet.    3.Mural Thrombus: Continue Eliquis. Refills provided.   4. HFreF: Most recent echo revealed EF of 35%,  Will need to repeat this study for evaluation of improvement in symptoms. [name] evidence of volume overload.   Current medicines are reviewed at length with the patient today.    Labs/ tests ordered today include: None Bettey Mare. Liborio Nixon, ANP, AACC   10/01/2017 12:05 PM    [facility] [address] [place], [place] [address] Phone: [phone]; Fax: [phone])  [phone] 

## 2017-10-01 ENCOUNTER — Encounter: Payer: Self-pay | Admitting: Adult Health

## 2017-10-01 ENCOUNTER — Ambulatory Visit (INDEPENDENT_AMBULATORY_CARE_PROVIDER_SITE_OTHER): Payer: Self-pay | Admitting: Adult Health

## 2017-10-01 VITALS — BP 122/78 | HR 100 | Ht 65.0 in | Wt 167.2 lb

## 2017-10-01 DIAGNOSIS — I43 Cardiomyopathy in diseases classified elsewhere: Secondary | ICD-10-CM

## 2017-10-01 DIAGNOSIS — E7800 Pure hypercholesterolemia, unspecified: Secondary | ICD-10-CM

## 2017-10-01 DIAGNOSIS — I2510 Atherosclerotic heart disease of native coronary artery without angina pectoris: Secondary | ICD-10-CM

## 2017-10-01 DIAGNOSIS — I236 Thrombosis of atrium, auricular appendage, and ventricle as current complications following acute myocardial infarction: Secondary | ICD-10-CM

## 2017-10-01 MED ORDER — EZETIMIBE 10 MG PO TABS: 10.0000 mg | ORAL_TABLET | Freq: Every day | ORAL | 3 refills | Status: DC

## 2017-10-01 MED ORDER — APIXABAN 5 MG PO TABS: 5.0000 mg | ORAL_TABLET | Freq: Two times a day (BID) | ORAL | 3 refills | Status: DC

## 2017-10-01 MED FILL — !ELIQUIS 5MG TABLET: 5 | 30 days supply | Qty: 60 | Fill #0

## 2017-10-01 MED FILL — EZETIMIBE 10 MG TABS: 10 | 30 days supply | Qty: 30 | Fill #0

## 2017-10-01 NOTE — Patient Instructions (Signed)
Medication Instructions:  NO CHANGES- Your physician recommends that you continue on your current medications as directed. Please refer to the Current Medication list given to you today.  If you need a refill on your cardiac medications before your next appointment, please call your pharmacy.  Follow-Up: Your physician wants you to follow-up in: 6 MONTHS WITH DR CRENSHAW.   Thank you for choosing [facility] at [facility]!!

## 2017-10-02 ENCOUNTER — Telehealth: Payer: Self-pay | Admitting: Adult Health

## 2017-10-02 NOTE — Telephone Encounter (Signed)
New message   Pt c/o medication issue:  1. Name of Medication: atorvastatin (LIPITOR) 80 MG tablet  2. How are you currently taking this medication (dosage and times per day)?   3. Are you having a reaction (difficulty breathing--STAT)? no  4. What is your medication issue?  Patient wants to know if she needs to continue Lipitor

## 2017-10-02 NOTE — Telephone Encounter (Signed)
Per K. Lawrence DNP note from 10/01/17: 2 Hypercholesterolemia: Recent labs revealed LDL of 128. She remains on atorvastatin 80 mg daily, will add Zetia. She is counseled on a low cholesterol diet.    Patient aware to continue both oral high cholesterol medications

## 2017-10-10 NOTE — Telephone Encounter (Signed)
Received fax from [facility] stating they received application for assistance with ELIQUIS. They will respond within 2 business days.

## 2018-01-02 MED FILL — METOPROLOL SUCCINATE ER 50: 50 | 30 days supply | Qty: 30 | Fill #1

## 2018-01-02 MED FILL — ATORVASTATIN 80 MG TABLET: 80 | 30 days supply | Qty: 30 | Fill #1

## 2018-01-02 MED FILL — ELIQUIS 5 MG TABLET: 5 | 30 days supply | Qty: 60 | Fill #1

## 2018-01-02 MED FILL — PANTOPRAZOLE SOD DR 40 MG T: 40 | 30 days supply | Qty: 30 | Fill #1

## 2018-01-02 MED FILL — LISINOPRIL 2.5 MG TABLET: 2.5 | 30 days supply | Qty: 30 | Fill #1

## 2018-03-07 ENCOUNTER — Other Ambulatory Visit: Payer: Self-pay

## 2018-03-07 DIAGNOSIS — I25110 Atherosclerotic heart disease of native coronary artery with unstable angina pectoris: Secondary | ICD-10-CM

## 2018-03-07 DIAGNOSIS — I255 Ischemic cardiomyopathy: Secondary | ICD-10-CM

## 2018-03-07 MED ORDER — APIXABAN 5 MG PO TABS: 5.0000 mg | ORAL_TABLET | Freq: Two times a day (BID) | ORAL | 3 refills | Status: DC

## 2018-03-07 MED ORDER — ATORVASTATIN CALCIUM 80 MG PO TABS: 80.0000 mg | ORAL_TABLET | Freq: Every day | ORAL | 3 refills | Status: DC

## 2018-03-07 MED ORDER — LISINOPRIL 2.5 MG PO TABS: 2.5000 mg | ORAL_TABLET | Freq: Every day | ORAL | 3 refills | Status: DC

## 2018-03-12 ENCOUNTER — Other Ambulatory Visit: Payer: Self-pay | Admitting: *Deleted

## 2018-03-12 MED ORDER — METOPROLOL SUCCINATE ER 50 MG PO TB24: 50.0000 mg | ORAL_TABLET | Freq: Every day | ORAL | 2 refills | Status: DC

## 2018-03-17 ENCOUNTER — Telehealth: Payer: Self-pay | Admitting: Cardiology

## 2018-03-17 NOTE — Telephone Encounter (Signed)
New Message  .        Patient is calling to verify the continuing taking "Panpoprazole 40 mg"

## 2018-03-18 MED ORDER — PANTOPRAZOLE SODIUM 40 MG PO TBEC: 40.0000 mg | DELAYED_RELEASE_TABLET | Freq: Every day | ORAL | 3 refills | Status: DC

## 2018-03-18 NOTE — Telephone Encounter (Signed)
Spoke with pt, aware to continue the protonix and refill sent to the pharmacy

## 2018-03-19 ENCOUNTER — Telehealth: Payer: Self-pay

## 2018-03-19 ENCOUNTER — Other Ambulatory Visit: Payer: Self-pay

## 2018-03-19 DIAGNOSIS — I255 Ischemic cardiomyopathy: Secondary | ICD-10-CM

## 2018-03-19 NOTE — Telephone Encounter (Signed)
Called pt to schedule lab work for eliquis but pt stated they do not need eliquis refilled they need Pantoprolazol refilled instead and that somehow there was miscommunication when they called previously.

## 2018-03-20 NOTE — Progress Notes (Signed)
HPI: FU coronary artery disease. Patient previously admitted with abdominal pain. Abdominal CT showed thrombus at the LV apex. Echocardiogram May 2017 showed ejection fraction 35-40% with anterior and apical wall motion abnormality. Grade 2 diastolic dysfunction. Thrombus at the apex. Mild mitral regurgitation and mild left atrial enlargement. Cardiac catheterization May 2017 showed a 90% proximal to mid LAD, 60% ramus and 50% right coronary artery. It was felt that she had likely infarcted the LAD territory and medical therapy recommended unless she has recurrent symptoms. She was placed on apixaban. Readmitted July 2017 and cardiac cath showed a 70% ramus, 50% followed by 55% LAD and occluded right coronary artery. Ejection 30-35%. Apical thrombus noted. Cardiac MRI July 2017 showed ejection fraction 35%. There was a large apical thrombus. There was mild mitral regurgitation.  Patient seen by Dr. Cornelius Moraswen October 2017. Medical therapy recommended. There has been problems with compliance. Last echocardiogram December 2018 showed ejection fraction 30 to 35%, mild diastolic dysfunction and apical thrombus. Since last seen, the patient denies any dyspnea on exertion, orthopnea, PND, pedal edema, palpitations, syncope or chest pain.   Current Outpatient Medications  Medication Sig Dispense Refill  . apixaban (ELIQUIS) 5 MG TABS tablet Take 1 tablet (5 mg total) by mouth 2 (two) times daily. 180 tablet 3  . atorvastatin (LIPITOR) 80 MG tablet Take 1 tablet (80 mg total) by mouth daily. 90 tablet 3  . lisinopril (PRINIVIL,ZESTRIL) 2.5 MG tablet Take 1 tablet (2.5 mg total) by mouth daily. 90 tablet 3  . metoprolol succinate (TOPROL-XL) 50 MG 24 hr tablet Take 1 tablet (50 mg total) by mouth daily. Take with or immediately following a meal. 90 tablet 2  . pantoprazole (PROTONIX) 40 MG tablet Take 1 tablet (40 mg total) by mouth daily. 90 tablet 3  . ezetimibe (ZETIA) 10 MG tablet Take 1 tablet (10 mg  total) by mouth daily. 90 tablet 3   No current facility-administered medications for this visit.      Past Medical History:  Diagnosis Date  . Anticoagulation adequate 10/14/2015  . CAD (coronary artery disease)   . Coronary atherosclerosis of native coronary artery, multivessel with plans for CABG to be followed   08/04/2015  . Ischemic cardiomyopathy   . UTI (urinary tract infection) 10/14/2015    Past Surgical History:  Procedure Laterality Date  . CARDIAC CATHETERIZATION N/A 08/04/2015   Procedure: Coronary/Graft Angiography;  Surgeon: Peter M SwazilandJordan, MD;  [facility];  Service: Cardiovascular;  Laterality: N/A;  . CARDIAC CATHETERIZATION N/A 10/10/2015   Procedure: Left Heart Cath and Coronary Angiography;  Surgeon: Lennette Biharihomas A Kelly, MD;  Location: [facility] INVASIVE CV LAB;  Service: Cardiovascular;  Laterality: N/A;  . CESAREAN SECTION      Social History   Socioeconomic History  . Marital status: Divorced    Spouse name: Not on file  . Number of children: Not on file  . Years of education: Not on file  . Highest education level: Not on file  Occupational History  . Not on file  Social Needs  . Financial resource strain: Not on file  . Food insecurity:    Worry: Not on file    Inability: Not on file  . Transportation needs:    Medical: Not on file    Non-medical: Not on file  Tobacco Use  . Smoking status: Current Some Day Smoker    Packs/day: 0.50    Types: Cigarettes  . Smokeless tobacco: Never Used  Substance and Sexual Activity  . Alcohol use: Yes    Alcohol/week: 0.0 standard drinks    Comment: Two drinks a night.  . Drug use: No    Comment: No history of cocaine use  . Sexual activity: Not on file  Lifestyle  . Physical activity:    Days per week: Not on file    Minutes per session: Not on file  . Stress: Not on file  Relationships  . Social connections:    Talks on phone: Not on file    Gets together: Not on file    Attends religious  service: Not on file    Active member of club or organization: Not on file    Attends meetings of clubs or organizations: Not on file    Relationship status: Not on file  . Intimate partner violence:    Fear of current or ex partner: Not on file    Emotionally abused: Not on file    Physically abused: Not on file    Forced sexual activity: Not on file  Other Topics Concern  . Not on file  Social History Narrative  . Not on file    Family History  Problem Relation Age of Onset  . Diabetes Mellitus II Neg Hx   . CAD Neg Hx     ROS: no fevers or chills, productive cough, hemoptysis, dysphasia, odynophagia, melena, hematochezia, dysuria, hematuria, rash, seizure activity, orthopnea, PND, pedal edema, claudication. Remaining systems are negative.  Physical Exam: Well-developed well-nourished in no acute distress.  Skin is warm and dry.  HEENT is normal.  Neck is supple.  Chest is clear to auscultation with normal expansion.  Cardiovascular exam is regular rate and rhythm.  Abdominal exam nontender or distended. No masses palpated. Extremities show no edema. neuro grossly intact  ECG-sinus rhythm at a rate of 97, left ventricular hypertrophy, anterior infarct.  Personally reviewed  A/P  1 coronary artery disease-patient denies chest pain or dyspnea.  Plan to continue medical therapy.  Continue statin.  She is not on aspirin given need for anticoagulation.  2 history of left ventricular apical thrombus-continue apixaban.  Check hemoglobin and renal function.  3 ischemic cardiomyopathy-patient does not have evidence of congestive heart failure.  I will increase lisinopril to 5 mg daily and Toprol to 75 mg daily.  Check potassium and renal function in 1 week.  She will return to see 1 of our APP's in 2 to 4 weeks to further titrate cardiac medications.  We will repeat echocardiogram after medications titrated.  If ejection fraction less than 35% would need ICD.  4  hyperlipidemia-continue statin.  Check lipids and liver.  5 tobacco abuse-patient counseled on discontinuing.  Olga Millers, MD

## 2018-04-01 ENCOUNTER — Encounter: Payer: Self-pay | Admitting: Cardiology

## 2018-04-01 ENCOUNTER — Encounter (INDEPENDENT_AMBULATORY_CARE_PROVIDER_SITE_OTHER): Payer: Self-pay

## 2018-04-01 ENCOUNTER — Ambulatory Visit (INDEPENDENT_AMBULATORY_CARE_PROVIDER_SITE_OTHER): Payer: Medicare HMO | Admitting: Cardiology

## 2018-04-01 VITALS — BP 138/82 | HR 97 | Ht 65.0 in | Wt 173.0 lb

## 2018-04-01 DIAGNOSIS — E7800 Pure hypercholesterolemia, unspecified: Secondary | ICD-10-CM

## 2018-04-01 DIAGNOSIS — I10 Essential (primary) hypertension: Secondary | ICD-10-CM | POA: Diagnosis not present

## 2018-04-01 DIAGNOSIS — I255 Ischemic cardiomyopathy: Secondary | ICD-10-CM

## 2018-04-01 DIAGNOSIS — I2510 Atherosclerotic heart disease of native coronary artery without angina pectoris: Secondary | ICD-10-CM

## 2018-04-01 MED ORDER — LISINOPRIL 5 MG PO TABS: 5.0000 mg | ORAL_TABLET | Freq: Every day | ORAL | 3 refills | Status: DC

## 2018-04-01 MED ORDER — METOPROLOL SUCCINATE ER 25 MG PO TB24: 25.0000 mg | ORAL_TABLET | Freq: Every day | ORAL | 3 refills | Status: DC

## 2018-04-01 NOTE — Patient Instructions (Signed)
Medication Instructions:  INCREASE LISINOPRIL TO 5 MG ONCE DAILY= 2 OF THE 2.5 MG TABLETS ONCE DAILY  INCREASE METOPROLOL TO 75 MG ONCE DAILY= 1 OF THE 50 MG TABLETS AND 1 OF THE 25 MG TABLETS ONCE DAILY  If you need a refill on your cardiac medications before your next appointment, please call your pharmacy.   Lab work: Your physician recommends that you return for lab work in: ONE WEEK PRIOR TO EATING If you have labs (blood work) drawn today and your tests are completely normal, you will receive your results only by: Marland Kitchen MyChart Message (if you have MyChart) OR . A paper copy in the mail If you have any lab test that is abnormal or we need to change your treatment, we will call you to review the results.   Follow-Up: At [facility], you and your health needs are our priority.  As part of our continuing mission to provide you with exceptional heart care, we have created designated Provider Care Teams.  These Care Teams include your primary Cardiologist (physician) and Advanced Practice Providers (APPs -  Physician Assistants and Nurse Practitioners) who all work together to provide you with the care you need, when you need it.  Your physician recommends that you schedule a follow-up appointment in: 2-4 WEEKS WITH APP  Your physician recommends that you schedule a follow-up appointment in: 3 MONTHS WITH DR Jens Som

## 2018-04-22 ENCOUNTER — Encounter: Payer: Self-pay | Admitting: Medical

## 2018-04-22 ENCOUNTER — Ambulatory Visit (INDEPENDENT_AMBULATORY_CARE_PROVIDER_SITE_OTHER): Payer: Medicare HMO | Admitting: Medical

## 2018-04-22 VITALS — BP 130/82 | HR 97 | Ht 65.0 in | Wt 174.8 lb

## 2018-04-22 DIAGNOSIS — I10 Essential (primary) hypertension: Secondary | ICD-10-CM

## 2018-04-22 DIAGNOSIS — E7800 Pure hypercholesterolemia, unspecified: Secondary | ICD-10-CM

## 2018-04-22 DIAGNOSIS — I739 Peripheral vascular disease, unspecified: Secondary | ICD-10-CM | POA: Diagnosis not present

## 2018-04-22 DIAGNOSIS — Z720 Tobacco use: Secondary | ICD-10-CM

## 2018-04-22 DIAGNOSIS — I255 Ischemic cardiomyopathy: Secondary | ICD-10-CM | POA: Diagnosis not present

## 2018-04-22 DIAGNOSIS — I2510 Atherosclerotic heart disease of native coronary artery without angina pectoris: Secondary | ICD-10-CM

## 2018-04-22 DIAGNOSIS — I513 Intracardiac thrombosis, not elsewhere classified: Secondary | ICD-10-CM

## 2018-04-22 MED ORDER — LISINOPRIL 10 MG PO TABS: 10.0000 mg | ORAL_TABLET | Freq: Every day | ORAL | 2 refills | Status: DC

## 2018-04-22 MED ORDER — METOPROLOL SUCCINATE ER 100 MG PO TB24: 100.0000 mg | ORAL_TABLET | Freq: Every day | ORAL | 2 refills | Status: DC

## 2018-04-22 MED ORDER — NITROGLYCERIN 0.4 MG SL SUBL: 0.4000 mg | SUBLINGUAL_TABLET | SUBLINGUAL | 1 refills | Status: DC | PRN

## 2018-04-22 NOTE — Patient Instructions (Addendum)
Medication Instructions:  INCREASE Lisinopril to 10mg  Take 1 tablet once a day  INCREASE Metoprolol Succinate to 100mg  Take 1 tablet once a day  START Nitrostat Take 1 tablet as needed for emergency chest pain  If you need a refill on your cardiac medications before your next appointment, please call your pharmacy.   Lab work: Complete you labs by the end of the week  If you have labs (blood work) drawn today and your tests are completely normal, you will receive your results only by: Marland Kitchen MyChart Message (if you have MyChart) OR . A paper copy in the mail If you have any lab test that is abnormal or we need to change your treatment, we will call you to review the results.  Testing/Procedures: Your physician has requested that you have an echocardiogram. Echocardiography is a painless test that uses sound waves to create images of your heart. It provides your doctor with information about the size and shape of your heart and how well your heart's chambers and valves are working. This procedure takes approximately one hour. There are no restrictions for this procedure. SCHEDULE ECHO IN 3 MONTHS   Your physician has requested that you have an ankle brachial index (ABI). During this test an ultrasound and blood pressure cuff are used to evaluate the arteries that supply the arms and legs with blood. Allow thirty minutes for this exam. There are no restrictions or special instructions.  Your physician has requested that you have a lower extremity arterial exercise duplex. During this test, exercise and ultrasound are used to evaluate arterial blood flow in the legs. Allow one hour for this exam. There are no restrictions or special instructions.    Follow-Up: At [facility], you and your health needs are our priority.  As part of our continuing mission to provide you with exceptional heart care, we have created designated Provider Care Teams.  These Care Teams include your primary Cardiologist  (physician) and Advanced Practice Providers (APPs -  Physician Assistants and Nurse Practitioners) who all work together to provide you with the care you need, when you need it.  . Your physician recommends that you schedule a follow-up appointment in: AFTER ECHO WITH DR CRENSHAW ONLY  Any Other Special Instructions Will Be Listed Below (If Applicable). I will have our Care Guide contact you to discuss home health options.    Low-Sodium Eating Plan Sodium, which is an element that makes up salt, helps you maintain a healthy balance of fluids in your body. Too much sodium can increase your blood pressure and cause fluid and waste to be held in your body. Your health care provider or dietitian may recommend following this plan if you have high blood pressure (hypertension), kidney disease, liver disease, or heart failure. Eating less sodium can help lower your blood pressure, reduce swelling, and protect your heart, liver, and kidneys. What are tips for following this plan? General guidelines  Most people on this plan should limit their sodium intake to 1,500-2,000 mg (milligrams) of sodium each day. Reading food labels   The Nutrition Facts label lists the amount of sodium in one serving of the food. If you eat more than one serving, you must multiply the listed amount of sodium by the number of servings.  Choose foods with less than 140 mg of sodium per serving.  Avoid foods with 300 mg of sodium or more per serving. Shopping  Look for lower-sodium products, often labeled as "low-sodium" or "no salt added."  Always check the sodium content even if foods are labeled as "unsalted" or "no salt added".  Buy fresh foods. ? Avoid canned foods and premade or frozen meals. ? Avoid canned, cured, or processed meats  Buy breads that have less than 80 mg of sodium per slice. Cooking  Eat more home-cooked food and less restaurant, buffet, and fast food.  Avoid adding salt when cooking. Use  salt-free seasonings or herbs instead of table salt or sea salt. Check with your health care provider or pharmacist before using salt substitutes.  Cook with plant-based oils, such as canola, sunflower, or olive oil. Meal planning  When eating at a restaurant, ask that your food be prepared with less salt or no salt, if possible.  Avoid foods that contain MSG (monosodium glutamate). MSG is sometimes added to [place] food, bouillon, and some canned foods. What foods are recommended? The items listed may not be a complete list. Talk with your dietitian about what dietary choices are best for you. Grains Low-sodium cereals, including oats, puffed wheat and rice, and shredded wheat. Low-sodium crackers. Unsalted rice. Unsalted pasta. Low-sodium bread. Whole-grain breads and whole-grain pasta. Vegetables Fresh or frozen vegetables. "No salt added" canned vegetables. "No salt added" tomato sauce and paste. Low-sodium or reduced-sodium tomato and vegetable juice. Fruits Fresh, frozen, or canned fruit. Fruit juice. Meats and other protein foods Fresh or frozen (no salt added) meat, poultry, seafood, and fish. Low-sodium canned tuna and salmon. Unsalted nuts. Dried peas, beans, and lentils without added salt. Unsalted canned beans. Eggs. Unsalted nut butters. Dairy Milk. Soy milk. Cheese that is naturally low in sodium, such as ricotta cheese, fresh mozzarella, or Swiss cheese Low-sodium or reduced-sodium cheese. Cream cheese. Yogurt. Fats and oils Unsalted butter. Unsalted margarine with no trans fat. Vegetable oils such as canola or olive oils. Seasonings and other foods Fresh and dried herbs and spices. Salt-free seasonings. Low-sodium mustard and ketchup. Sodium-free salad dressing. Sodium-free light mayonnaise. Fresh or refrigerated horseradish. Lemon juice. Vinegar. Homemade, reduced-sodium, or low-sodium soups. Unsalted popcorn and pretzels. Low-salt or salt-free chips. What foods are not  recommended? The items listed may not be a complete list. Talk with your dietitian about what dietary choices are best for you. Grains Instant hot cereals. Bread stuffing, pancake, and biscuit mixes. Croutons. Seasoned rice or pasta mixes. Noodle soup cups. Boxed or frozen macaroni and cheese. Regular salted crackers. Self-rising flour. Vegetables Sauerkraut, pickled vegetables, and relishes. Olives. [place] fries. Onion rings. Regular canned vegetables (not low-sodium or reduced-sodium). Regular canned tomato sauce and paste (not low-sodium or reduced-sodium). Regular tomato and vegetable juice (not low-sodium or reduced-sodium). Frozen vegetables in sauces. Meats and other protein foods Meat or fish that is salted, canned, smoked, spiced, or pickled. Bacon, ham, sausage, hotdogs, corned beef, chipped beef, packaged lunch meats, salt pork, jerky, pickled herring, anchovies, regular canned tuna, sardines, salted nuts. Dairy Processed cheese and cheese spreads. Cheese curds. Blue cheese. Feta cheese. String cheese. Regular cottage cheese. Buttermilk. Canned milk. Fats and oils Salted butter. Regular margarine. Ghee. Bacon fat. Seasonings and other foods Onion salt, garlic salt, seasoned salt, table salt, and sea salt. Canned and packaged gravies. Worcestershire sauce. Tartar sauce. Barbecue sauce. Teriyaki sauce. Soy sauce, including reduced-sodium. Steak sauce. Fish sauce. Oyster sauce. Cocktail sauce. Horseradish that you find on the shelf. Regular ketchup and mustard. Meat flavorings and tenderizers. Bouillon cubes. Hot sauce and Tabasco sauce. Premade or packaged marinades. Premade or packaged taco seasonings. Relishes. Regular salad dressings. Salsa. Potato and tortilla  chips. Corn chips and puffs. Salted popcorn and pretzels. Canned or dried soups. Pizza. Frozen entrees and pot pies. Summary  Eating less sodium can help lower your blood pressure, reduce swelling, and protect your heart, liver,  and kidneys.  Most people on this plan should limit their sodium intake to 1,500-2,000 mg (milligrams) of sodium each day.  Canned, boxed, and frozen foods are high in sodium. Restaurant foods, fast foods, and pizza are also very high in sodium. You also get sodium by adding salt to food.  Try to cook at home, eat more fresh fruits and vegetables, and eat less fast food, canned, processed, or prepared foods. This information is not intended to replace advice given to you by your health care provider. Make sure you discuss any questions you have with your health care provider. Document Released: 09/08/2001 Document Revised: 03/12/2016 Document Reviewed: 03/12/2016 Elsevier Interactive Patient Education  2019 [employer].

## 2018-04-22 NOTE — Progress Notes (Signed)
Cardiology Office Note   Date:  04/22/2018   ID:  Casey [name], DOB April 23, 1970, MRN [id number]  PCP:  Casey Register, MD  Cardiologist:  Casey Millers, MD EP: None  Chief Complaint  Patient presents with  . Follow-up    ischemic cardiomyopathy and CAD      History of Present Illness: Casey [name] is a 48 y.o. female with PMH of moderate-severe multivessel CAD (last cath 2017) evaluated by CT Surgery and recommended for medical management rather than CABG, LV aneurysm with mural wall thrombus, ischemic cardiomyopathy (EF 30-35% 03/2017), who presents for medication follow-up.   She was last seen by cardiology, Dr. Jens [name], outpatient 04/01/18 for routine follow-up and was without cardiac complaints at that time. Her lisinopril was increased to 5mg  daily and Toprol increased to 75mg  daily. She was recommended for close follow-up with an APP for ongoing medication titration with plans to repeat echo in 3 months to reassess EF and need for ICD.   She returns today for follow-up with her daughter. She reports doing well from a cardiac standpoint and reports being compliant with her medications. She continues to smoke daily and is not interested in using aids to quit (ie. Gum, patches, or medications). She thinks cold [place] is the best option for her but she is not at a place where she thinks she can quit at this time. She likely has some sodium intake indiscretions and was educated on dietary modifications. [name] complaints of chest pain at rest or with exertion, SOB, DOE, LE edema, orthopnea, PND, dizziness, lightheadedness, or syncope. She is doing well with apixaban and denies hematuria, hematochezia, or melena. She read the PAD poster on the back of the exam door and reports having claudication after walking 1 block as well as cold feet.       Past Medical History:  Diagnosis Date  . Anticoagulation adequate 10/14/2015  . CAD (coronary artery disease)   . Coronary  atherosclerosis of native coronary artery, multivessel with plans for CABG to be followed   08/04/2015  . Ischemic cardiomyopathy   . UTI (urinary tract infection) 10/14/2015    Past Surgical History:  Procedure Laterality Date  . CARDIAC CATHETERIZATION N/A 08/04/2015   Procedure: Coronary/Graft Angiography;  Surgeon: Peter M Swaziland, MD;  Location[facility] INVASIVE CV LAB;  Service: Cardiovascular;  Laterality: N/A;  . CARDIAC CATHETERIZATION N/A 10/10/2015   Procedure: Left Heart Cath and Coronary Angiography;  Surgeon: Lennette Bihari, MD;  Location: [facility] INVASIVE CV LAB;  Service: Cardiovascular;  Laterality: N/A;  . CESAREAN SECTION       Current Outpatient Medications  Medication Sig Dispense Refill  . apixaban (ELIQUIS) 5 MG TABS tablet Take 1 tablet (5 mg total) by mouth 2 (two) times daily. 180 tablet 3  . atorvastatin (LIPITOR) 80 MG tablet Take 1 tablet (80 mg total) by mouth daily. 90 tablet 3  . lisinopril (PRINIVIL,ZESTRIL) 10 MG tablet Take 1 tablet (10 mg total) by mouth daily. 90 tablet 2  . metoprolol succinate (TOPROL-XL) 100 MG 24 hr tablet Take 1 tablet (100 mg total) by mouth daily. 90 tablet 2  . pantoprazole (PROTONIX) 40 MG tablet Take 1 tablet (40 mg total) by mouth daily. 90 tablet 3  . nitroGLYCERIN (NITROSTAT) 0.4 MG SL tablet Place 1 tablet (0.4 mg total) under the tongue every 5 (five) minutes as needed for chest pain. 75 tablet 1   [name] current facility-administered medications for this visit.  Allergies:   Patient has [name] known allergies.    Social History:  The patient  reports that she has been smoking cigarettes. She has been smoking about 0.50 packs per day. She has never used smokeless tobacco. She reports current alcohol use. She reports that she does not use drugs.   Family History:  The patient's family history is negative for CAD.   ROS:  Please see the history of present illness.   Otherwise, review of systems are positive for none.   All other systems  are reviewed and negative.    PHYSICAL EXAM: VS:  BP 130/82   Pulse 97   Ht 5\' 5"  (1.651 m)   Wt 174 lb 12.8 oz (79.3 kg)   SpO2 98%   BMI 29.09 kg/m  , BMI Body mass index is 29.09 kg/m. GEN: Well nourished, well developed, in [name] acute distress HEENT: sclera anicteric Neck: [name] JVD, carotid bruits, or masses Cardiac: RRR; [name] murmurs, rubs, or gallops,[name] edema  Respiratory:  clear to auscultation bilaterally, normal work of breathing GI: soft, nontender, nondistended, + BS MS: [name] deformity or atrophy Skin: warm and dry, [name] rash Neuro:  Strength and sensation are intact Psych: euthymic mood, full affect   EKG:  EKG is not ordered today.    Recent Labs: 05/09/2017: ALT 63; BUN 4; Creatinine, Ser 0.64; Potassium 4.0; Sodium 142    Lipid Panel    Component Value Date/Time   CHOL 194 09/16/2017 1031   TRIG 77 09/16/2017 1031   HDL 50 09/16/2017 1031   CHOLHDL 3.9 09/16/2017 1031   CHOLHDL 2.6 10/14/2015 0357   VLDL 13 10/14/2015 0357   LDLCALC 129 (H) 09/16/2017 1031      Wt Readings from Last 3 Encounters:  04/22/18 174 lb 12.8 oz (79.3 kg)  04/01/18 173 lb (78.5 kg)  10/01/17 167 lb 3.2 oz (75.8 kg)      Other studies Reviewed: Additional studies/ records that were reviewed today include:   Echocardiogram 03/2017: Study Conclusions  - Left ventricle: The cavity size was normal. Wall thickness was   increased in a pattern of mild LVH. Systolic function was   moderately to severely reduced. The estimated ejection fraction   was in the range of 30% to 35%. Akinesis of the   mid-apicalanteroseptal and apical myocardium. Doppler parameters   are consistent with abnormal left ventricular relaxation (grade 1   diastolic dysfunction).  Impressions:  - Moderately reduced LV function   There is an apical thrombus present.  Left heart catheterization 10/2015:  There is moderate to severe left ventricular systolic dysfunction.  Ost Ramus to Ramus lesion, 70%  stenosed.  Ost LAD to Prox LAD lesion, 50% stenosed.  Prox LAD to Mid LAD lesion, 55% stenosed.  Prox Cx lesion, 20% stenosed.  Ost RCA lesion, 100% stenosed.   Moderately severe LV dysfunction with an ejection fraction of 30-35% with hypokinesis of the anterior to anterolateral wall with akinesis to dyskinesis in the distal anterior apical and apical segment with suggestion of a large, smooth bordered, probable mural thrombus apically extending to the distal anterolateral wall and focal basal inferior hypocontractility.  Previously documented dissection of the proximal LAD with improved filling and residual narrowing of 50-55%; 70% smooth eccentric proximal ramus stenosis, mild 20% left circumflex stenosis, and new total occlusion of the RCA at its origin with extensive left to right collaterals supplying almost the entire RCA territory distally to near [place].  Recommendation: Angiograms will be reviewed with colleagues.  Since her catheterization of 08/04/2015, the RCA is now occluded [facility] which she has extensively collateralized via left injection.  She continues to have residual LAD proximal dissection, but there is TIMI-3 flow.  Smoking cessation is essential.  It appears that she has an extensive left ventricular mural thrombus.  An echocardiogram was ordered.  She will need to be on anticoagulation long-term.  In addition, she has reduced right femoral pulse and duplex imaging of her right lower extremity is recommended.  Recommended.   ASSESSMENT AND PLAN:  1. CAD: moderate-severe multivessel CAD noted on last cath 2017. Seen by CT Surgery (Dr. Orvan Julywen's) and recommended for medical management. Not on aspirin given need for anticoagulation. [name] anginal complaints - Continue statin and Toprol - Will send an Rx for SL nitro as needed given significant CAD   2. Ischemic cardiomyopathy: EF 30-35% on last echo 03/2017. Her lisinopril and Toprol were increased at last visit with Dr.  Jens Somrenshaw 03/2018. BP today is 130/82 with HR 97. [name] volume overload complaints and she appears euvolemic on exam. Importance of maintaining a low sodium diet discussed - Will increase lisiniopril to 10mg  daily - Will increase Toprol XL to 100mg  daily - Will plan for a repeat echo in 3 months to assess LV status - if EF remains <35%, may need to consider ICD  3. LV apical thrombus: continues to be compliant with her apixaban. [name] complaints of bleeding - Continue apixaban  4. HLD: LDL 129 08/2017. Patient reports that she never started taking zetia. She also did not follow-up for a FLP as recommended 03/2018. She ate breakfast this morning - Patient instructed to return for FLP this week - Continue atorvastatin  - If LDL persistently elevated, will add zetia for improved cholesterol  5. Claudication: patient describes calf cramping after walking 1 block, as well as cold feet. Would not be surprising if patient had PAD given long smoking history, known CAD, HTN, and HLD.  - Will check ABI's to further evaluate symptoms.   6. Tobacco abuse: continues to smoke daily and is not interested in using aids to quit (ie. Gum, patches, or medications). She thinks cold [place] is the best option for her but she is not at a place where she thinks she can quit at this time. Risks of continued smoking discussed - Continue to encourage smoking cessation   Current medicines are reviewed at length with the patient today.  The patient does not have concerns regarding medicines.  The following changes have been made:  Increase lisinopril to 10mg  daily and Toprol XL to 100mg  daily  Labs/ tests ordered today include: CBC, CMET, FLP, ABI's, Echo  Orders Placed This Encounter  Procedures  . ECHOCARDIOGRAM COMPLETE     Disposition:   FU with Dr. Jens Somrenshaw in 3 months  Signed, Beatriz StallionKrista M. , PA-C  04/22/2018 11:41 AM

## 2018-04-24 LAB — CBC
HEMOGLOBIN: 11.2 g/dL (ref 11.1–15.9)
Hematocrit: 33.1 % — ABNORMAL LOW (ref 34.0–46.6)
MCH: 30.5 pg (ref 26.6–33.0)
MCHC: 33.8 g/dL (ref 31.5–35.7)
MCV: 90 fL (ref 79–97)
Platelets: 367 10*3/uL (ref 150–450)
RBC: 3.67 x10E6/uL — AB (ref 3.77–5.28)
RDW: 15 % (ref 11.7–15.4)
WBC: 8.8 10*3/uL (ref 3.4–10.8)

## 2018-04-24 LAB — COMPREHENSIVE METABOLIC PANEL
ALK PHOS: 55 IU/L (ref 39–117)
ALT: 9 IU/L (ref 0–32)
AST: 12 IU/L (ref 0–40)
Albumin/Globulin Ratio: 1.5 (ref 1.2–2.2)
Albumin: 3.8 g/dL (ref 3.8–4.8)
BUN/Creatinine Ratio: 14 (ref 9–23)
BUN: 9 mg/dL (ref 6–24)
Bilirubin Total: 0.3 mg/dL (ref 0.0–1.2)
CO2: 21 mmol/L (ref 20–29)
Calcium: 9.4 mg/dL (ref 8.7–10.2)
Chloride: 102 mmol/L (ref 96–106)
Creatinine, Ser: 0.64 mg/dL (ref 0.57–1.00)
GFR calc Af Amer: 123 mL/min/{1.73_m2} (ref >59)
GFR calc non Af Amer: 107 mL/min/{1.73_m2} (ref >59)
Globulin, Total: 2.5 g/dL (ref 1.5–4.5)
Glucose: 88 mg/dL (ref 65–99)
Potassium: 4.4 mmol/L (ref 3.5–5.2)
Sodium: 137 mmol/L (ref 134–144)
Total Protein: 6.3 g/dL (ref 6.0–8.5)

## 2018-04-24 LAB — LIPID PANEL
Chol/HDL Ratio: 2.8 ratio (ref 0.0–4.4)
Cholesterol, Total: 126 mg/dL (ref 100–199)
HDL: 45 mg/dL (ref >39)
LDL CALC: 64 mg/dL (ref 0–99)
Triglycerides: 87 mg/dL (ref 0–149)
VLDL Cholesterol Cal: 17 mg/dL (ref 5–40)

## 2018-04-25 ENCOUNTER — Telehealth: Payer: Self-pay

## 2018-04-25 ENCOUNTER — Encounter: Payer: Self-pay | Admitting: *Deleted

## 2018-04-25 NOTE — Telephone Encounter (Signed)
Pt is returning call concerning referral. Says she needs assistance with transportation to appointments, ADL's . Sister, Auburn Bilberry [phone]) is currently assisting. Would like for her to be paid for services. Will reach out to Child psychotherapist for assistance.

## 2018-04-25 NOTE — Telephone Encounter (Signed)
Called to follow up on home health referral. No answer. Left vm

## 2018-04-30 ENCOUNTER — Telehealth: Payer: Self-pay

## 2018-05-01 ENCOUNTER — Other Ambulatory Visit: Payer: Self-pay | Admitting: Medical

## 2018-05-01 DIAGNOSIS — I739 Peripheral vascular disease, unspecified: Secondary | ICD-10-CM

## 2018-05-09 ENCOUNTER — Encounter (HOSPITAL_COMMUNITY): Payer: Medicare HMO

## 2018-05-09 ENCOUNTER — Ambulatory Visit (HOSPITAL_COMMUNITY)
Admission: RE | Admit: 2018-05-09 | Payer: Medicare HMO | Source: Ambulatory Visit | Attending: Medical | Admitting: Medical

## 2018-05-22 ENCOUNTER — Ambulatory Visit (HOSPITAL_COMMUNITY)
Admission: RE | Admit: 2018-05-22 | Payer: Medicare HMO | Source: Ambulatory Visit | Attending: Medical | Admitting: Medical

## 2018-06-09 NOTE — Progress Notes (Signed)
HPI: FU coronary artery disease. Patient previously admitted with abdominal pain. Abdominal CT showed thrombus at the LV apex. Echocardiogram May 2017 showed ejection fraction 35-40% with anterior and apical wall motion abnormality. Grade 2 diastolic dysfunction. Thrombus at the apex. Mild mitral regurgitation and mild left atrial enlargement. Cardiac catheterization May 2017 showed a 90% proximal to mid LAD, 60% ramus and 50% right coronary artery. It was felt that she had likely infarcted the LAD territory and medical therapy recommended unless she has recurrent symptoms. She was placed on apixaban. Readmitted July 2017 and cardiac cath showed a 70% ramus, 50% followed by 55% LAD and occluded right coronary artery. Ejection 30-35%. Apical thrombus noted.Cardiac MRI July 2017 showed ejection fraction 35%. There was a large apical thrombus. There was mild mitral regurgitation. Patient seen by Dr. Cornelius Moras October 2017. Medical therapy recommended. There has been problems with compliance.Last echocardiogram December 2018 showed ejection fraction 30 to 35%, mild diastolic dysfunction and apical thrombus. Since last seen,the patient denies any dyspnea on exertion, orthopnea, PND, pedal edema, palpitations, syncope or chest pain.   Current Outpatient Medications  Medication Sig Dispense Refill  . apixaban (ELIQUIS) 5 MG TABS tablet Take 1 tablet (5 mg total) by mouth 2 (two) times daily. 180 tablet 3  . atorvastatin (LIPITOR) 80 MG tablet Take 1 tablet (80 mg total) by mouth daily. 90 tablet 3  . lisinopril (PRINIVIL,ZESTRIL) 10 MG tablet Take 1 tablet (10 mg total) by mouth daily. 90 tablet 2  . metoprolol succinate (TOPROL-XL) 100 MG 24 hr tablet Take 1 tablet (100 mg total) by mouth daily. 90 tablet 2  . nitroGLYCERIN (NITROSTAT) 0.4 MG SL tablet Place 1 tablet (0.4 mg total) under the tongue every 5 (five) minutes as needed for chest pain. 75 tablet 1  . pantoprazole (PROTONIX) 40 MG tablet Take  1 tablet (40 mg total) by mouth daily. 90 tablet 3   No current facility-administered medications for this visit.      Past Medical History:  Diagnosis Date  . Anticoagulation adequate 10/14/2015  . CAD (coronary artery disease)   . Coronary atherosclerosis of native coronary artery, multivessel with plans for CABG to be followed   08/04/2015  . Ischemic cardiomyopathy   . UTI (urinary tract infection) 10/14/2015    Past Surgical History:  Procedure Laterality Date  . CARDIAC CATHETERIZATION N/A 08/04/2015   Procedure: Coronary/Graft Angiography;  Surgeon: Peter M Swaziland, MD;  Location[facility] INVASIVE CV LAB;  Service: Cardiovascular;  Laterality: N/A;  . CARDIAC CATHETERIZATION N/A 10/10/2015   Procedure: Left Heart Cath and Coronary Angiography;  Surgeon: Lennette Bihari, MD;  Location: [facility] INVASIVE CV LAB;  Service: Cardiovascular;  Laterality: N/A;  . CESAREAN SECTION      Social History   Socioeconomic History  . Marital status: Divorced    Spouse name: Not on file  . Number of children: Not on file  . Years of education: Not on file  . Highest education level: Not on file  Occupational History  . Not on file  Social Needs  . Financial resource strain: Not on file  . Food insecurity:    Worry: Not on file    Inability: Not on file  . Transportation needs:    Medical: Not on file    Non-medical: Not on file  Tobacco Use  . Smoking status: Current Some Day Smoker    Packs/day: 0.50    Types: Cigarettes  . Smokeless tobacco: Never Used  Substance and Sexual Activity  . Alcohol use: Yes    Alcohol/week: 0.0 standard drinks    Comment: Two drinks a night.  . Drug use: No    Comment: No history of cocaine use  . Sexual activity: Not on file  Lifestyle  . Physical activity:    Days per week: Not on file    Minutes per session: Not on file  . Stress: Not on file  Relationships  . Social connections:    Talks on phone: Not on file    Gets together: Not on file     Attends religious service: Not on file    Active member of club or organization: Not on file    Attends meetings of clubs or organizations: Not on file    Relationship status: Not on file  . Intimate partner violence:    Fear of current or ex partner: Not on file    Emotionally abused: Not on file    Physically abused: Not on file    Forced sexual activity: Not on file  Other Topics Concern  . Not on file  Social History Narrative  . Not on file    Family History  Problem Relation Age of Onset  . Diabetes Mellitus II Neg Hx   . CAD Neg Hx     ROS: no fevers or chills, productive cough, hemoptysis, dysphasia, odynophagia, melena, hematochezia, dysuria, hematuria, rash, seizure activity, orthopnea, PND, pedal edema, claudication. Remaining systems are negative.  Physical Exam: Well-developed well-nourished in no acute distress.  Skin is warm and dry.  HEENT is normal.  Neck is supple.  Chest is clear to auscultation with normal expansion.  Cardiovascular exam is regular rate and rhythm.  Abdominal exam nontender or distended. No masses palpated. Extremities show no edema. neuro grossly intact  A/P  1 coronary artery disease-patient doing well with no symptoms of chest pain or dyspnea.  Plan to continue medical therapy including statin.  She is not on aspirin given need for anticoagulation.  2 ischemic cardiomyopathy-we will plan to repeat echocardiogram.  If ejection fraction less than 35% would consider ICD.  Discontinue lisinopril.  In 36 hours we will begin Entresto 24/26 twice daily.  Check potassium and renal function in 1 week.  Continue Toprol.  3 history of left ventricular apical thrombus-continue apixaban.    4 hyperlipidemia-continue statin.   5 tobacco abuse-patient counseled on discontinuing.  Olga Millers, MD

## 2018-06-17 ENCOUNTER — Ambulatory Visit (INDEPENDENT_AMBULATORY_CARE_PROVIDER_SITE_OTHER): Payer: Medicare HMO | Admitting: Cardiology

## 2018-06-17 ENCOUNTER — Encounter: Payer: Self-pay | Admitting: Cardiology

## 2018-06-17 ENCOUNTER — Other Ambulatory Visit: Payer: Self-pay

## 2018-06-17 VITALS — BP 124/72 | HR 98 | Ht 65.0 in | Wt 169.0 lb

## 2018-06-17 DIAGNOSIS — I2510 Atherosclerotic heart disease of native coronary artery without angina pectoris: Secondary | ICD-10-CM | POA: Diagnosis not present

## 2018-06-17 DIAGNOSIS — I255 Ischemic cardiomyopathy: Secondary | ICD-10-CM | POA: Diagnosis not present

## 2018-06-17 DIAGNOSIS — E7800 Pure hypercholesterolemia, unspecified: Secondary | ICD-10-CM

## 2018-06-17 MED ORDER — SACUBITRIL-VALSARTAN 24-26 MG PO TABS: 1.0000 | ORAL_TABLET | Freq: Two times a day (BID) | ORAL | 12 refills | Status: DC

## 2018-06-17 NOTE — Patient Instructions (Signed)
Medication Instructions:  STOP LISINOPRIL  Thursday THIS WEEK START ENTRESTO 24/26 MG ONE TABLET TWICE DAILY If you need a refill on your cardiac medications before your next appointment, please call your pharmacy.   Lab work: Your physician recommends that you return for lab work in: ONE WEEK If you have labs (blood work) drawn today and your tests are completely normal, you will receive your results only by: Marland Kitchen MyChart Message (if you have MyChart) OR . A paper copy in the mail If you have any lab test that is abnormal or we need to change your treatment, we will call you to review the results.  Testing/Procedures: Your physician has requested that you have an echocardiogram. Echocardiography is a painless test that uses sound waves to create images of your heart. It provides your doctor with information about the size and shape of your heart and how well your heart's chambers and valves are working. This procedure takes approximately one hour. There are no restrictions for this procedure.  [address]  Follow-Up: At [facility], you and your health needs are our priority.  As part of our continuing mission to provide you with exceptional heart care, we have created designated Provider Care Teams.  These Care Teams include your primary Cardiologist (physician) and Advanced Practice Providers (APPs -  Physician Assistants and Nurse Practitioners) who all work together to provide you with the care you need, when you need it. You will need a follow up appointment in 6 months.  Please call our office 2 months in advance to schedule this appointment.  You may see Olga Millers, MD or one of the following Advanced Practice Providers on your designated Care Team:   Corine Shelter, PA-C Judy Pimple, [place] . Marjie Skiff, PA-C

## 2018-06-20 ENCOUNTER — Telehealth: Payer: Self-pay | Admitting: Cardiology

## 2018-06-20 NOTE — Telephone Encounter (Signed)
Instructed pt to continue taking med and call early next week to check if have heard anything from insurance co. Pt agrees ./cy

## 2018-06-20 NOTE — Telephone Encounter (Signed)
New message  Pt c/o medication issue:  1. Name of Medication: sacubitril-valsartan (ENTRESTO) 24-26 MG  2. How are you currently taking this medication (dosage and times per day)? n/a  3. Are you having a reaction (difficulty breathing--STAT)? n/a  4. What is your medication issue?Patient is waiting for this medication to be approved by insurance. Should she stop taking until this approved. Please advise.

## 2018-06-24 NOTE — Telephone Encounter (Signed)
° °  Patient calling for status of pre auth for [facility]

## 2018-06-24 NOTE — Telephone Encounter (Signed)
PA started through cover my meds.

## 2018-06-26 NOTE — Telephone Encounter (Signed)
PA complete and approved until 04/02/2019  

## 2018-07-15 ENCOUNTER — Other Ambulatory Visit (HOSPITAL_COMMUNITY): Payer: Medicare HMO

## 2018-07-18 ENCOUNTER — Telehealth: Payer: Self-pay | Admitting: Internal Medicine

## 2018-07-18 NOTE — Telephone Encounter (Signed)
Case reviewed Pt with histroy of cardiomyopathy   Seen in clinic recently   Plan for echo to reevaluate LVEF    Given corona virus pandemic and concern for minimizing potential exposure will postpone echo until June/July when infection concerns lessen.  Gunnar Fusi RossMD

## 2018-07-22 ENCOUNTER — Telehealth: Payer: Self-pay | Admitting: Cardiology

## 2018-07-22 ENCOUNTER — Ambulatory Visit (HOSPITAL_COMMUNITY): Payer: Medicare HMO

## 2018-07-22 NOTE — Telephone Encounter (Signed)
Spoke with pt, aware no appointment needed until echo rescheduled. She will call once echo is scheduled.

## 2018-07-22 NOTE — Telephone Encounter (Signed)
New Message:    Pt said she went to [facility] today for her Echo. It had been canceled. She wants to know if she still need he appt on 07-28-18. It was a follow up visit after her Echo.

## 2018-07-28 ENCOUNTER — Telehealth: Payer: Medicare HMO | Admitting: Cardiology

## 2018-08-11 ENCOUNTER — Telehealth (HOSPITAL_COMMUNITY): Payer: Self-pay

## 2018-08-11 NOTE — Telephone Encounter (Signed)

## 2018-08-13 ENCOUNTER — Other Ambulatory Visit: Payer: Self-pay

## 2018-08-13 ENCOUNTER — Ambulatory Visit (HOSPITAL_COMMUNITY): Payer: Medicare HMO | Attending: Cardiology

## 2018-08-13 DIAGNOSIS — I255 Ischemic cardiomyopathy: Secondary | ICD-10-CM | POA: Insufficient documentation

## 2018-08-13 MED ORDER — PERFLUTREN LIPID MICROSPHERE
1.0000 mL | INTRAVENOUS | Status: AC | PRN
  Administered 2018-08-13: 2 mL via INTRAVENOUS
  Administered 2018-08-13: 1 mL via INTRAVENOUS

## 2018-08-15 ENCOUNTER — Telehealth: Payer: Self-pay | Admitting: *Deleted

## 2018-08-15 DIAGNOSIS — I255 Ischemic cardiomyopathy: Secondary | ICD-10-CM

## 2018-08-15 NOTE — Telephone Encounter (Signed)
Spoke with pt, Aware of dr crenshaw's recommendations. Referral placed. ° °

## 2018-08-15 NOTE — Telephone Encounter (Signed)
-----   Message from Lewayne Bunting, MD sent at 08/14/2018  7:16 AM EDT ----- Refer to EP for consideration of ICD as EF remains low Olga Millers

## 2018-09-01 ENCOUNTER — Other Ambulatory Visit: Payer: Self-pay

## 2018-09-01 ENCOUNTER — Telehealth (INDEPENDENT_AMBULATORY_CARE_PROVIDER_SITE_OTHER): Payer: Medicare HMO | Admitting: Internal Medicine

## 2018-09-01 DIAGNOSIS — I255 Ischemic cardiomyopathy: Secondary | ICD-10-CM | POA: Diagnosis not present

## 2018-09-01 DIAGNOSIS — Z720 Tobacco use: Secondary | ICD-10-CM

## 2018-09-01 NOTE — H&P (View-Only) (Signed)
Electrophysiology TeleHealth Note   Due to national recommendations of social distancing due to COVID 19, an audio/video telehealth visit is felt to be most appropriate for this patient at this time.  See MyChart message from today for the patient's consent to telehealth for [facility].   Date:  09/01/2018   ID:  Casey [name], DOB 1970/05/21, MRN [id number]  Location: patient's home  Provider location: [address], [place]  Evaluation Performed: Follow-up visit  PCP:  Patient, [name] Pcp Per  Cardiologist:  Olga Millers, MD  Electrophysiologist:  Dr Ladona Ridgel  Chief Complaint:  "I feel alright."  History of Present Illness:    Casey [name] is a 48 y.o. female who presents via audio/video conferencing for a telehealth visit today. She is a pleasant 48 yo woman with an ICM, chronic class 2 CHF, claudication, LV apical thrombus and persistent LV dysfunction. Her EF is 30% despite maximal medical therapy. She has never had syncope. Her exertion is limited by pain in her legs. She is still smoking. The patient is otherwise without complaint today.  The patient denies symptoms of fevers, chills, cough, or new SOB worrisome for COVID 19.  Past Medical History:  Diagnosis Date  . Anticoagulation adequate 10/14/2015  . CAD (coronary artery disease)   . Coronary atherosclerosis of native coronary artery, multivessel with plans for CABG to be followed   08/04/2015  . Ischemic cardiomyopathy   . UTI (urinary tract infection) 10/14/2015    Past Surgical History:  Procedure Laterality Date  . CARDIAC CATHETERIZATION N/A 08/04/2015   Procedure: Coronary/Graft Angiography;  Surgeon: Peter M Swaziland, MD;  Location: [facility] INVASIVE CV LAB;  Service: Cardiovascular;  Laterality: N/A;  . CARDIAC CATHETERIZATION N/A 10/10/2015   Procedure: Left Heart Cath and Coronary Angiography;  Surgeon: Lennette Bihari, MD;  Location: [facility] INVASIVE CV LAB;  Service: Cardiovascular;  Laterality:  N/A;  . CESAREAN SECTION      Current Outpatient Medications  Medication Sig Dispense Refill  . apixaban (ELIQUIS) 5 MG TABS tablet Take 1 tablet (5 mg total) by mouth 2 (two) times daily. 180 tablet 3  . atorvastatin (LIPITOR) 80 MG tablet Take 1 tablet (80 mg total) by mouth daily. 90 tablet 3  . metoprolol succinate (TOPROL-XL) 100 MG 24 hr tablet Take 1 tablet (100 mg total) by mouth daily. 90 tablet 2  . nitroGLYCERIN (NITROSTAT) 0.4 MG SL tablet Place 1 tablet (0.4 mg total) under the tongue every 5 (five) minutes as needed for chest pain. 75 tablet 1  . pantoprazole (PROTONIX) 40 MG tablet Take 1 tablet (40 mg total) by mouth daily. 90 tablet 3  . sacubitril-valsartan (ENTRESTO) 24-26 MG Take 1 tablet by mouth 2 (two) times daily. 60 tablet 12   [name] current facility-administered medications for this visit.     Allergies:   Patient has [name] known allergies.   Social History:  The patient  reports that she has been smoking cigarettes. She has been smoking about 0.50 packs per day. She has never used smokeless tobacco. She reports current alcohol use. She reports that she does not use drugs.   Family History:  The patient's  family history is not on file.   ROS:  Please see the history of present illness.   All other systems are personally reviewed and negative.    Exam:    Vital Signs:  There were [name] vitals taken for this visit.  Well appearing, alert and conversant, regular  work of breathing,  good skin color Eyes- anicteric, neuro- grossly intact, skin- [name] apparent rash or lesions or cyanosis, mouth- oral mucosa is pink   Labs/Other Tests and Data Reviewed:    Recent Labs: 04/24/2018: ALT 9; BUN 9; Creatinine, Ser 0.64; Hemoglobin 11.2; Platelets 367; Potassium 4.4; Sodium 137   Wt Readings from Last 3 Encounters:  06/17/18 169 lb (76.7 kg)  04/22/18 174 lb 12.8 oz (79.3 kg)  04/01/18 173 lb (78.5 kg)     Other studies personally reviewed:   ASSESSMENT & PLAN:     1.  Chronic systolic heart failure - she has class 2 symptoms. I have recommended she consider ICD insertion. She is on maximal medical therapy. I have reviewed the risks/benefits/goals/expectations of ICD insertion and she will call us if she wishes to proceed. 2. Tobacco abuse - I have encouraged her to stop smoking. 3. ICM - she denies anginal symptoms. She will continue her current meds. 4. COVID 19 screen The patient denies symptoms of COVID 19 at this time.  The importance of social distancing was discussed today.  Follow-up:  Based on decision to undergo ICD insertion Next remote: n/a  Current medicines are reviewed at length with the patient today.   The patient does not have concerns regarding her medicines.  The following changes were made today:  none  Labs/ tests ordered today include: none [name] orders of the defined types were placed in this encounter.    Patient Risk:  after full review of this patients clinical status, I feel that they are at moderate risk at this time.  Today, I have spent 30 minutes with the patient with telehealth technology discussing all of the above .    Signed, Lewayne Bunting, MD  09/01/2018 3:38 PM     [facility] [address] [place] [address] [phone] (office) [phone] (fax)

## 2018-09-01 NOTE — Progress Notes (Signed)
   Electrophysiology TeleHealth Note   Due to national recommendations of social distancing due to COVID 19, an audio/video telehealth visit is felt to be most appropriate for this patient at this time.  See MyChart message from today for the patient's consent to telehealth for [facility].   Date:  09/01/2018   ID:  Casey [name], DOB 02/05/1971, MRN [id number]  Location: patient's home  Provider location: [address], [place]  Evaluation Performed: Follow-up visit  PCP:  Patient, No Pcp Per  Cardiologist:  Brian Crenshaw, MD  Electrophysiologist:  Dr Taylor  Chief Complaint:  "I feel alright."  History of Present Illness:    Casey [name] is a 48 y.o. female who presents via audio/video conferencing for a telehealth visit today. She is a pleasant 48 yo woman with an ICM, chronic class 2 CHF, claudication, LV apical thrombus and persistent LV dysfunction. Her EF is 30% despite maximal medical therapy. She has never had syncope. Her exertion is limited by pain in her legs. She is still smoking. The patient is otherwise without complaint today.  The patient denies symptoms of fevers, chills, cough, or new SOB worrisome for COVID 19.  Past Medical History:  Diagnosis Date  . Anticoagulation adequate 10/14/2015  . CAD (coronary artery disease)   . Coronary atherosclerosis of native coronary artery, multivessel with plans for CABG to be followed   08/04/2015  . Ischemic cardiomyopathy   . UTI (urinary tract infection) 10/14/2015    Past Surgical History:  Procedure Laterality Date  . CARDIAC CATHETERIZATION N/A 08/04/2015   Procedure: Coronary/Graft Angiography;  Surgeon: Peter M Jordan, MD;  Location: [facility] INVASIVE CV LAB;  Service: Cardiovascular;  Laterality: N/A;  . CARDIAC CATHETERIZATION N/A 10/10/2015   Procedure: Left Heart Cath and Coronary Angiography;  Surgeon: Thomas A Kelly, MD;  Location: [facility] INVASIVE CV LAB;  Service: Cardiovascular;  Laterality:  N/A;  . CESAREAN SECTION      Current Outpatient Medications  Medication Sig Dispense Refill  . apixaban (ELIQUIS) 5 MG TABS tablet Take 1 tablet (5 mg total) by mouth 2 (two) times daily. 180 tablet 3  . atorvastatin (LIPITOR) 80 MG tablet Take 1 tablet (80 mg total) by mouth daily. 90 tablet 3  . metoprolol succinate (TOPROL-XL) 100 MG 24 hr tablet Take 1 tablet (100 mg total) by mouth daily. 90 tablet 2  . nitroGLYCERIN (NITROSTAT) 0.4 MG SL tablet Place 1 tablet (0.4 mg total) under the tongue every 5 (five) minutes as needed for chest pain. 75 tablet 1  . pantoprazole (PROTONIX) 40 MG tablet Take 1 tablet (40 mg total) by mouth daily. 90 tablet 3  . sacubitril-valsartan (ENTRESTO) 24-26 MG Take 1 tablet by mouth 2 (two) times daily. 60 tablet 12   No current facility-administered medications for this visit.     Allergies:   Patient has no known allergies.   Social History:  The patient  reports that she has been smoking cigarettes. She has been smoking about 0.50 packs per day. She has never used smokeless tobacco. She reports current alcohol use. She reports that she does not use drugs.   Family History:  The patient's  family history is not on file.   ROS:  Please see the history of present illness.   All other systems are personally reviewed and negative.    Exam:    Vital Signs:  There were no vitals taken for this visit.  Well appearing, alert and conversant, regular   work of breathing,  good skin color Eyes- anicteric, neuro- grossly intact, skin- no apparent rash or lesions or cyanosis, mouth- oral mucosa is pink   Labs/Other Tests and Data Reviewed:    Recent Labs: 04/24/2018: ALT 9; BUN 9; Creatinine, Ser 0.64; Hemoglobin 11.2; Platelets 367; Potassium 4.4; Sodium 137   Wt Readings from Last 3 Encounters:  06/17/18 169 lb (76.7 kg)  04/22/18 174 lb 12.8 oz (79.3 kg)  04/01/18 173 lb (78.5 kg)     Other studies personally reviewed:   ASSESSMENT & PLAN:     1.  Chronic systolic heart failure - she has class 2 symptoms. I have recommended she consider ICD insertion. She is on maximal medical therapy. I have reviewed the risks/benefits/goals/expectations of ICD insertion and she will call us if she wishes to proceed. 2. Tobacco abuse - I have encouraged her to stop smoking. 3. ICM - she denies anginal symptoms. She will continue her current meds. 4. COVID 19 screen The patient denies symptoms of COVID 19 at this time.  The importance of social distancing was discussed today.  Follow-up:  Based on decision to undergo ICD insertion Next remote: n/a  Current medicines are reviewed at length with the patient today.   The patient does not have concerns regarding her medicines.  The following changes were made today:  none  Labs/ tests ordered today include: none No orders of the defined types were placed in this encounter.    Patient Risk:  after full review of this patients clinical status, I feel that they are at moderate risk at this time.  Today, I have spent 30 minutes with the patient with telehealth technology discussing all of the above .    Signed, Lewayne Bunting, MD  09/01/2018 3:38 PM     [facility] [address] [place] [address] [phone] (office) [phone] (fax)

## 2018-09-11 ENCOUNTER — Telehealth: Payer: Self-pay | Admitting: Internal Medicine

## 2018-09-11 DIAGNOSIS — I255 Ischemic cardiomyopathy: Secondary | ICD-10-CM

## 2018-09-11 NOTE — Telephone Encounter (Signed)
I did not see any notes about a monitor being ordered. Pt last seen with Dr.Taylor .

## 2018-09-11 NOTE — Telephone Encounter (Signed)
Called pt to clarify her need. She would like for Dr Tanna Furry RN to call and schedule her for an ICD implant. Per Dr Tanna Furry last [place] note, pt would be calling the office to schedule her procedure.  I advised pt his RN was not in the office today, but would forward the message to her to get that scheduled.   She verbalized understanding and had no additional questions.

## 2018-09-11 NOTE — Telephone Encounter (Signed)
New Message     Pt is calling because she said she was suppose to be called on Tuesday to schedule an appt for a Monitor     Please call back

## 2018-09-12 NOTE — Telephone Encounter (Signed)
Outreach made to Pt.  Left message requesting call back to schedule ICD.  Offered June 29.

## 2018-09-12 NOTE — Telephone Encounter (Signed)
Call returned to Pt.  Pt scheduled for ICD on June 29 at 11:30 am.  Pt will have labs/covid test on June 26  Mailing instruction letter today

## 2018-09-12 NOTE — Telephone Encounter (Signed)
Follow up   Patient is returning your phone call. Please call 

## 2018-09-26 ENCOUNTER — Other Ambulatory Visit (HOSPITAL_COMMUNITY)
Admission: RE | Admit: 2018-09-26 | Discharge: 2018-09-26 | Disposition: A | Discharge status: Home / Self Care | Payer: Medicare HMO | Source: Ambulatory Visit | Attending: Internal Medicine | Admitting: Internal Medicine

## 2018-09-26 ENCOUNTER — Other Ambulatory Visit: Payer: Medicare HMO

## 2018-09-26 DIAGNOSIS — Z1159 Encounter for screening for other viral diseases: Secondary | ICD-10-CM | POA: Insufficient documentation

## 2018-09-26 LAB — SARS CORONAVIRUS 2 (TAT 6-24 HRS): SARS Coronavirus 2: NEGATIVE

## 2018-09-29 ENCOUNTER — Ambulatory Visit (HOSPITAL_COMMUNITY)
Admission: RE | Admit: 2018-09-29 | Discharge: 2018-09-29 | Disposition: A | Discharge status: Home / Self Care | Payer: Medicare HMO | Attending: Internal Medicine | Admitting: Internal Medicine

## 2018-09-29 ENCOUNTER — Other Ambulatory Visit: Payer: Self-pay

## 2018-09-29 ENCOUNTER — Encounter (HOSPITAL_COMMUNITY)
Admission: RE | Disposition: A | Discharge status: Home / Self Care | Payer: Self-pay | Source: Home / Self Care | Attending: Internal Medicine

## 2018-09-29 ENCOUNTER — Ambulatory Visit (HOSPITAL_COMMUNITY): Payer: Medicare HMO

## 2018-09-29 DIAGNOSIS — F17210 Nicotine dependence, cigarettes, uncomplicated: Secondary | ICD-10-CM | POA: Diagnosis not present

## 2018-09-29 DIAGNOSIS — I255 Ischemic cardiomyopathy: Secondary | ICD-10-CM | POA: Diagnosis present

## 2018-09-29 DIAGNOSIS — Z95810 Presence of automatic (implantable) cardiac defibrillator: Secondary | ICD-10-CM

## 2018-09-29 DIAGNOSIS — Z79899 Other long term (current) drug therapy: Secondary | ICD-10-CM | POA: Insufficient documentation

## 2018-09-29 DIAGNOSIS — Z006 Encounter for examination for normal comparison and control in clinical research program: Secondary | ICD-10-CM | POA: Diagnosis not present

## 2018-09-29 DIAGNOSIS — I5022 Chronic systolic (congestive) heart failure: Secondary | ICD-10-CM | POA: Diagnosis not present

## 2018-09-29 DIAGNOSIS — I509 Heart failure, unspecified: Secondary | ICD-10-CM

## 2018-09-29 DIAGNOSIS — Z7901 Long term (current) use of anticoagulants: Secondary | ICD-10-CM | POA: Insufficient documentation

## 2018-09-29 DIAGNOSIS — I428 Other cardiomyopathies: Secondary | ICD-10-CM

## 2018-09-29 DIAGNOSIS — I2510 Atherosclerotic heart disease of native coronary artery without angina pectoris: Secondary | ICD-10-CM | POA: Diagnosis not present

## 2018-09-29 HISTORY — PX: ICD IMPLANT: EP1208

## 2018-09-29 LAB — CBC
HCT: 30 % — ABNORMAL LOW (ref 36.0–46.0)
Hemoglobin: 9.9 g/dL — ABNORMAL LOW (ref 12.0–15.0)
MCH: 31.7 pg (ref 26.0–34.0)
MCHC: 33 g/dL (ref 30.0–36.0)
MCV: 96.2 fL (ref 80.0–100.0)
Platelets: 292 10*3/uL (ref 150–400)
RBC: 3.12 MIL/uL — ABNORMAL LOW (ref 3.87–5.11)
RDW: 17.3 % — ABNORMAL HIGH (ref 11.5–15.5)
WBC: 5.6 10*3/uL (ref 4.0–10.5)
nRBC: 0 % (ref 0.0–0.2)

## 2018-09-29 LAB — BASIC METABOLIC PANEL
Anion gap: 8 (ref 5–15)
BUN: 5 mg/dL — ABNORMAL LOW (ref 6–20)
CO2: 22 mmol/L (ref 22–32)
Calcium: 8.7 mg/dL — ABNORMAL LOW (ref 8.9–10.3)
Chloride: 107 mmol/L (ref 98–111)
Creatinine, Ser: 0.62 mg/dL (ref 0.44–1.00)
GFR calc Af Amer: >60 mL/min (ref >60)
GFR calc non Af Amer: >60 mL/min (ref >60)
Glucose, Bld: 99 mg/dL (ref 70–99)
Potassium: 3.3 mmol/L — ABNORMAL LOW (ref 3.5–5.1)
Sodium: 137 mmol/L (ref 135–145)

## 2018-09-29 LAB — SURGICAL PCR SCREEN
MRSA, PCR: NEGATIVE
Staphylococcus aureus: NEGATIVE

## 2018-09-29 SURGERY — ICD IMPLANT
Anesthesia: LOCAL

## 2018-09-29 MED ORDER — LIDOCAINE HCL (PF) 1 % IJ SOLN
INTRAMUSCULAR | Status: DC | PRN
  Administered 2018-09-29: 60 mL

## 2018-09-29 MED ORDER — MIDAZOLAM HCL 5 MG/5ML IJ SOLN
INTRAMUSCULAR | Status: AC
  Filled 2018-09-29: qty 5

## 2018-09-29 MED ORDER — METOPROLOL SUCCINATE ER 100 MG PO TB24: 100.0000 mg | ORAL_TABLET | Freq: Every day | ORAL | Status: DC

## 2018-09-29 MED ORDER — SODIUM CHLORIDE 0.9 % IV SOLN
INTRAVENOUS | Status: DC
  Administered 2018-09-29: 10:00:00 via INTRAVENOUS

## 2018-09-29 MED ORDER — MUPIROCIN 2 % EX OINT
1.0000 "application " | TOPICAL_OINTMENT | Freq: Once | CUTANEOUS | Status: AC
  Administered 2018-09-29: 1 via TOPICAL

## 2018-09-29 MED ORDER — CEFAZOLIN SODIUM-DEXTROSE 1-4 GM/50ML-% IV SOLN
1.0000 g | Freq: Four times a day (QID) | INTRAVENOUS | Status: AC
  Administered 2018-09-29: 1 g via INTRAVENOUS
  Filled 2018-09-29 (×2): qty 50

## 2018-09-29 MED ORDER — FENTANYL CITRATE (PF) 100 MCG/2ML IJ SOLN
INTRAMUSCULAR | Status: AC
  Filled 2018-09-29: qty 2

## 2018-09-29 MED ORDER — CEFAZOLIN SODIUM-DEXTROSE 2-4 GM/100ML-% IV SOLN
INTRAVENOUS | Status: AC
  Filled 2018-09-29: qty 100

## 2018-09-29 MED ORDER — FENTANYL CITRATE (PF) 100 MCG/2ML IJ SOLN: 25.0000 ug | INTRAMUSCULAR | Status: DC | PRN

## 2018-09-29 MED ORDER — SODIUM CHLORIDE 0.9 % IV SOLN
INTRAVENOUS | Status: AC
  Filled 2018-09-29: qty 2

## 2018-09-29 MED ORDER — PANTOPRAZOLE SODIUM 40 MG PO TBEC: 40.0000 mg | DELAYED_RELEASE_TABLET | Freq: Every day | ORAL | Status: DC

## 2018-09-29 MED ORDER — NITROGLYCERIN 0.4 MG SL SUBL: 0.4000 mg | SUBLINGUAL_TABLET | SUBLINGUAL | Status: DC | PRN

## 2018-09-29 MED ORDER — MIDAZOLAM HCL 5 MG/5ML IJ SOLN
INTRAMUSCULAR | Status: DC | PRN
  Administered 2018-09-29 (×4): 1 mg via INTRAVENOUS

## 2018-09-29 MED ORDER — HEPARIN (PORCINE) IN NACL 1000-0.9 UT/500ML-% IV SOLN
INTRAVENOUS | Status: AC
  Filled 2018-09-29: qty 500

## 2018-09-29 MED ORDER — CHLORHEXIDINE GLUCONATE 4 % EX LIQD
60.0000 mL | Freq: Once | CUTANEOUS | Status: DC
  Filled 2018-09-29: qty 60

## 2018-09-29 MED ORDER — FENTANYL CITRATE (PF) 100 MCG/2ML IJ SOLN
INTRAMUSCULAR | Status: DC | PRN
  Administered 2018-09-29 (×2): 12.5 ug via INTRAVENOUS
  Administered 2018-09-29: 25 ug via INTRAVENOUS

## 2018-09-29 MED ORDER — CEFAZOLIN SODIUM-DEXTROSE 2-4 GM/100ML-% IV SOLN
2.0000 g | INTRAVENOUS | Status: AC
  Administered 2018-09-29: 2 g via INTRAVENOUS

## 2018-09-29 MED ORDER — ACETAMINOPHEN 325 MG PO TABS: 325.0000 mg | ORAL_TABLET | ORAL | Status: DC | PRN

## 2018-09-29 MED ORDER — POTASSIUM CHLORIDE CRYS ER 20 MEQ PO TBCR: 40.0000 meq | EXTENDED_RELEASE_TABLET | Freq: Once | ORAL | Status: DC

## 2018-09-29 MED ORDER — HEPARIN (PORCINE) IN NACL 1000-0.9 UT/500ML-% IV SOLN
INTRAVENOUS | Status: DC | PRN
  Administered 2018-09-29: 500 mL

## 2018-09-29 MED ORDER — SODIUM CHLORIDE 0.9 % IV SOLN
80.0000 mg | INTRAVENOUS | Status: AC
  Administered 2018-09-29: 80 mg
  Filled 2018-09-29: qty 2

## 2018-09-29 MED ORDER — ONDANSETRON HCL 4 MG/2ML IJ SOLN: 4.0000 mg | Freq: Four times a day (QID) | INTRAMUSCULAR | Status: DC | PRN

## 2018-09-29 MED ORDER — ATORVASTATIN CALCIUM 80 MG PO TABS: 80.0000 mg | ORAL_TABLET | Freq: Every day | ORAL | Status: DC

## 2018-09-29 MED ORDER — MUPIROCIN 2 % EX OINT
TOPICAL_OINTMENT | CUTANEOUS | Status: AC
  Filled 2018-09-29: qty 22

## 2018-09-29 MED ORDER — LIDOCAINE HCL 1 % IJ SOLN
INTRAMUSCULAR | Status: AC
  Filled 2018-09-29: qty 60

## 2018-09-29 SURGICAL SUPPLY — 6 items
CABLE SURGICAL S-101-97-12 (CABLE) ×2 IMPLANT
ICD VIGILANT VR D232 (Pacemaker) ×2 IMPLANT
LEAD RELIANCE G DF4 0292 (Lead) ×2 IMPLANT
PAD PRO RADIOLUCENT 2001M-C (PAD) ×2 IMPLANT
SHEATH CLASSIC 9F (SHEATH) ×2 IMPLANT
TRAY PACEMAKER INSERTION (PACKS) ×2 IMPLANT

## 2018-09-29 NOTE — Discharge Instructions (Signed)
Supplemental Discharge Instructions for  Pacemaker/Defibrillator Patients  Activity No heavy lifting or vigorous activity with your left/right arm for 6 to 8 weeks.  Do not raise your left/right arm above your head for one week.  Gradually raise your affected arm as drawn below.              10/03/2018                   10/04/2018                  10/05/2018                10/06/2018 __  NO DRIVING for  1 week   ; you may begin driving on  04/07/6061   .  WOUND CARE - Keep the wound area clean and dry.  Do not get this area wet for one week. No showers for one week; you may shower on 10/06/2018    . - The tape/steri-strips on your wound will fall off; do not pull them off.  No bandage is needed on the site.  DO  NOT apply any creams, oils, or ointments to the wound area. - If you notice any drainage or discharge from the wound, any swelling or bruising at the site, or you develop a fever > 101? F after you are discharged home, call the office at once.  Special Instructions - You are still able to use cellular telephones; use the ear opposite the side where you have your pacemaker/defibrillator.  Avoid carrying your cellular phone near your device. - When traveling through airports, show security personnel your identification card to avoid being screened in the metal detectors.  Ask the security personnel to use the hand wand. - Avoid arc welding equipment, MRI testing (magnetic resonance imaging), TENS units (transcutaneous nerve stimulators).  Call the office for questions about other devices. - Avoid electrical appliances that are in poor condition or are not properly grounded. - Microwave ovens are safe to be near or to operate.  Additional information for defibrillator patients should your device go off: - If your device goes off ONCE and you feel fine afterward, notify the device clinic nurses. - If your device goes off ONCE and you do not feel well afterward, call 911. - If your device  goes off TWICE, call 911. - If your device goes off THREE times in one day, call 911.  DO NOT DRIVE YOURSELF OR A FAMILY MEMBER WITH A DEFIBRILLATOR TO THE HOSPITAL--CALL 911.   Moderate Conscious Sedation, Adult, Care After These instructions provide you with information about caring for yourself after your procedure. Your health care provider may also give you more specific instructions. Your treatment has been planned according to current medical practices, but problems sometimes occur. Call your health care provider if you have any problems or questions after your procedure. What can I expect after the procedure? After your procedure, it is common:  To feel sleepy for several hours.  To feel clumsy and have poor balance for several hours.  To have poor judgment for several hours.  To vomit if you eat too soon. Follow these instructions at home: For at least 24 hours after the procedure:   Do not: ? Participate in activities where you could fall or become injured. ? Drive. ? Use heavy machinery. ? Drink alcohol. ? Take sleeping pills or medicines that cause drowsiness. ? Make important decisions or sign legal documents. ? Take  care of children on your own.  Rest. Eating and drinking  Follow the diet recommended by your health care provider.  If you vomit: ? Drink water, juice, or soup when you can drink without vomiting. ? Make sure you have little or no nausea before eating solid foods. General instructions  Have a responsible adult stay with you until you are awake and alert.  Take over-the-counter and prescription medicines only as told by your health care provider.  If you smoke, do not smoke without supervision.  Keep all follow-up visits as told by your health care provider. This is important. Contact a health care provider if:  You keep feeling nauseous or you keep vomiting.  You feel light-headed.  You develop a rash.  You have a fever. Get help  right away if:  You have trouble breathing. This information is not intended to replace advice given to you by your health care provider. Make sure you discuss any questions you have with your health care provider. Document Released: 01/07/2013 Document Revised: 03/01/2017 Document Reviewed: 07/09/2015 Elsevier Patient Education  2020 [employer].

## 2018-09-29 NOTE — Progress Notes (Signed)
D/c instructions reviewed with patient and daughter Montenique.  All questions answered and Montenique verbalized understanding

## 2018-09-29 NOTE — Interval H&P Note (Signed)
History and Physical Interval Note:  09/29/2018 10:44 AM  Casey [name]  has presented today for surgery, with the diagnosis of cardiomyopathy.  The various methods of treatment have been discussed with the patient and family. After consideration of risks, benefits and other options for treatment, the patient has consented to  Procedure(s): ICD IMPLANT (N/A) as a surgical intervention.  The patient's history has been reviewed, patient examined, no change in status, stable for surgery.  I have reviewed the patient's chart and labs.  Questions were answered to the patient's satisfaction.     Cristopher Peru

## 2018-09-30 ENCOUNTER — Encounter (HOSPITAL_COMMUNITY): Payer: Self-pay | Admitting: Internal Medicine

## 2018-09-30 MED FILL — Lidocaine HCl Local Inj 1%: INTRAMUSCULAR | Qty: 60 | Status: AC

## 2018-10-08 ENCOUNTER — Telehealth: Payer: Self-pay | Admitting: Internal Medicine

## 2018-10-08 NOTE — Telephone Encounter (Signed)
°  1. Has your device fired?   2. Is you device beeping?   3. Are you experiencing draining or swelling at device site? Patient calling about her wound dressing. She wants to know if she should remove adhesive tape or if she should leave it there.   4. Are you calling to see if we received your device transmission?   5. Have you passed out?     Please route to [place]

## 2018-10-08 NOTE — Telephone Encounter (Signed)
Spoke with Dr. Lovena Le.  Pt should resume her Eliquis.  Pt asked about showering.  Advised she cannot get her wound wet.  Told her if she wanted to just let the shower hit her waist down and carefully keep wound dry that would be ok.  Pt indicates understanding.

## 2018-10-08 NOTE — Telephone Encounter (Signed)
I told the pt to keep the steri strips on until her wound check appointment. I told her the nurse will look at her wound and take the steri strips off then. The pt asked if she can take her Eliquis. I told her I will send a not to Dr. Lovena Le nurse Sonia Baller. Pt states Dr. Lovena Le told her not to take the medicine until after 10-06-2018 but she do not want to take until she get the okay from Dr. Lovena Le nurse.

## 2018-10-13 ENCOUNTER — Telehealth: Payer: Self-pay

## 2018-10-13 NOTE — Telephone Encounter (Signed)
    COVID-19 Pre-Screening Questions:  . In the past 7 to 10 days have you had a cough,  shortness of breath, headache, congestion, fever (100 or greater) body aches, chills, sore throat, or sudden loss of taste or sense of smell? No . Have you been around anyone with known Covid 19. No . Have you been around anyone who is awaiting Covid 19 test results in the past 7 to 10 days? No . Have you been around anyone who has been exposed to Covid 19, or has mentioned symptoms of Covid 19 within the past 7 to 10 days? No  If you have any concerns/questions about symptoms patients report during screening (either on the phone or at threshold). Contact the provider seeing the patient or DOD for further guidance.  If neither are available contact a member of the leadership team.         Pt answered No to all Covid-19 prescreening questions. I advised the pt to wear a mask to her appointment. I also told the pt come alone if she physically can because we are limiting the amount of people coming into the office. I told her to call the office if anything changes between now and her appointment time. The pt verbalized understanding

## 2018-10-14 ENCOUNTER — Other Ambulatory Visit: Payer: Self-pay

## 2018-10-14 ENCOUNTER — Ambulatory Visit (INDEPENDENT_AMBULATORY_CARE_PROVIDER_SITE_OTHER): Payer: Medicare HMO | Admitting: *Deleted

## 2018-10-14 DIAGNOSIS — I255 Ischemic cardiomyopathy: Secondary | ICD-10-CM

## 2018-10-14 NOTE — Progress Notes (Signed)
Wound check appointment. Steri-strips removed. Wound without redness or edema. Incision edges approximated, wound healing well. Normal device function. Thresholds, sensing, and impedances consistent with implant measurements. Device programmed at 3.5V for extra safety margin until 3 month visit. Histogram distribution appropriate for patient and level of activity. No mode switches or ventricular arrhythmias noted. Patient educated about wound care, arm mobility, lifting restrictions, shock plan. ROV 01/07/19 w/ GT, remote f/u every 3 months.

## 2018-10-22 ENCOUNTER — Telehealth: Payer: Self-pay | Admitting: Student

## 2018-10-22 NOTE — Telephone Encounter (Signed)
LMOM at home and mobile numbers.  Pt had episodes that were read by device as NSVT hovering around her detection zone between 157 - 161 bpm, Mostly occurred 10/17/2018 from 14:30 to 16:30. Called to assess if she remembered what she was doing during this time to assess need to adjust VT monitoring zone.    Legrand Como [address] [place], PA-C 10/22/2018 2:04 PM

## 2018-10-27 NOTE — Telephone Encounter (Signed)
Spoke with patient. She denies symptoms on 7/17, reports she was chasing her grandson around. He pressed the button on her monitor and she was worried that this would cause her to receive a shock. Educated pt about device programming, explained that pressing the monitor button only forces a transmission and will not cause her to receive a shock. Explained that these NSVT and VT1 monitor-zone episodes appear ST/SVT (morphology similar to presenting), first therapy zone programmed at 190bpm. Pt verbalizes understanding and denies questions or concerns at this time.   Will plan to consider adjusting monitor zone detection rate at 91 day f/u with Dr. Lovena Le on 01/07/19.

## 2018-11-21 ENCOUNTER — Other Ambulatory Visit: Payer: Self-pay | Admitting: Medical

## 2018-11-21 DIAGNOSIS — I739 Peripheral vascular disease, unspecified: Secondary | ICD-10-CM

## 2018-11-21 DIAGNOSIS — I255 Ischemic cardiomyopathy: Secondary | ICD-10-CM

## 2018-12-17 ENCOUNTER — Encounter: Payer: Medicare HMO | Admitting: Internal Medicine

## 2018-12-29 ENCOUNTER — Telehealth: Payer: Self-pay | Admitting: Student

## 2018-12-29 ENCOUNTER — Ambulatory Visit (INDEPENDENT_AMBULATORY_CARE_PROVIDER_SITE_OTHER): Payer: Medicare HMO | Admitting: *Deleted

## 2018-12-29 DIAGNOSIS — I255 Ischemic cardiomyopathy: Secondary | ICD-10-CM

## 2018-12-29 LAB — CUP PACEART REMOTE DEVICE CHECK
Date Time Interrogation Session: 20200928085427
Implantable Lead Implant Date: 20200629
Implantable Lead Location: 753860
Implantable Lead Model: 292
Implantable Lead Serial Number: 448526
Implantable Pulse Generator Implant Date: 20200629
Pulse Gen Serial Number: 262129

## 2018-12-29 NOTE — Telephone Encounter (Signed)
Discussed high HRs with patient. Yesterday she had a cluster of rates in the 160s between 1641 and 1729. She states at this time she was cooking, and walking back and forth a bunch in the house.   She denies symptoms of palpitations, chest pain, shortness of breath, dizziness, presyncope, or syncope. The patient is tolerating medications without difficulties.     No changes at this time, consider reprogramming zones at office visit next week.  Legrand Como [address] [place], PA-C  12/29/2018 2:26 PM

## 2018-12-29 NOTE — Telephone Encounter (Signed)
Called patient to discuss rapid heart rates.  Has regular HRs in the 160s that fall into her VT-1 monitor zone. Called to assess symptoms and ? Activity.   Presenting rhythm NSR.   She has appointment with Dr. Lovena Le next week. Can consider adjusting zones/rate control at that visit.   Legrand Como [address] [place], PA-C  12/29/2018 9:41 AM

## 2019-01-06 NOTE — Progress Notes (Signed)
Remote ICD transmission.   

## 2019-01-07 ENCOUNTER — Other Ambulatory Visit: Payer: Self-pay

## 2019-01-07 ENCOUNTER — Ambulatory Visit (INDEPENDENT_AMBULATORY_CARE_PROVIDER_SITE_OTHER): Payer: Medicare HMO | Admitting: Internal Medicine

## 2019-01-07 ENCOUNTER — Encounter (INDEPENDENT_AMBULATORY_CARE_PROVIDER_SITE_OTHER): Payer: Self-pay

## 2019-01-07 ENCOUNTER — Encounter: Payer: Self-pay | Admitting: Internal Medicine

## 2019-01-07 VITALS — BP 158/90 | HR 107 | Ht 65.0 in | Wt 168.0 lb

## 2019-01-07 DIAGNOSIS — Z95810 Presence of automatic (implantable) cardiac defibrillator: Secondary | ICD-10-CM | POA: Diagnosis not present

## 2019-01-07 DIAGNOSIS — I255 Ischemic cardiomyopathy: Secondary | ICD-10-CM | POA: Diagnosis not present

## 2019-01-07 LAB — CUP PACEART INCLINIC DEVICE CHECK
Date Time Interrogation Session: 20201007040000
HighPow Impedance: 56 Ohm
Implantable Lead Implant Date: 20200629
Implantable Lead Location: 753860
Implantable Lead Model: 292
Implantable Lead Serial Number: 448526
Implantable Pulse Generator Implant Date: 20200629
Lead Channel Impedance Value: 580 Ohm
Lead Channel Pacing Threshold Amplitude: 0.7 V
Lead Channel Pacing Threshold Pulse Width: 0.4 ms
Lead Channel Sensing Intrinsic Amplitude: 20 mV
Lead Channel Setting Pacing Amplitude: 2 V
Lead Channel Setting Pacing Pulse Width: 0.4 ms
Lead Channel Setting Sensing Sensitivity: 0.6 mV
Pulse Gen Serial Number: 262129

## 2019-01-07 NOTE — Progress Notes (Signed)
HPI Casey [name] is a 48 yo woman with an ICM, chronic class 2 CHF, claudication, LV apical thrombus and persistent LV dysfunction. Her EF is 30% despite maximal medical therapy. She has never had syncope. She underwent ICD insertion about 3 months ago. In the interim, she has done well.  No Known Allergies   Current Outpatient Medications  Medication Sig Dispense Refill  . apixaban (ELIQUIS) 5 MG TABS tablet Take 1 tablet (5 mg total) by mouth 2 (two) times daily. 180 tablet 3  . metoprolol succinate (TOPROL-XL) 100 MG 24 hr tablet TAKE 1 TABLET EVERY DAY 90 tablet 0  . pantoprazole (PROTONIX) 40 MG tablet Take 1 tablet (40 mg total) by mouth daily. 90 tablet 3  . sacubitril-valsartan (ENTRESTO) 24-26 MG Take 1 tablet by mouth 2 (two) times daily. 60 tablet 12  . nitroGLYCERIN (NITROSTAT) 0.4 MG SL tablet Place 1 tablet (0.4 mg total) under the tongue every 5 (five) minutes as needed for chest pain. 75 tablet 1   No current facility-administered medications for this visit.      Past Medical History:  Diagnosis Date  . Anticoagulation adequate 10/14/2015  . CAD (coronary artery disease)   . Coronary atherosclerosis of native coronary artery, multivessel with plans for CABG to be followed   08/04/2015  . Ischemic cardiomyopathy   . UTI (urinary tract infection) 10/14/2015    ROS:   All systems reviewed and negative except as noted in the HPI.   Past Surgical History:  Procedure Laterality Date  . CARDIAC CATHETERIZATION N/A 08/04/2015   Procedure: Coronary/Graft Angiography;  Surgeon: Peter M Swaziland, MD;  Location: [facility] INVASIVE CV LAB;  Service: Cardiovascular;  Laterality: N/A;  . CARDIAC CATHETERIZATION N/A 10/10/2015   Procedure: Left Heart Cath and Coronary Angiography;  Surgeon: Lennette Bihari, MD;  Location: [facility] INVASIVE CV LAB;  Service: Cardiovascular;  Laterality: N/A;  . CESAREAN SECTION    . ICD IMPLANT N/A 09/29/2018   Procedure: ICD IMPLANT;  Surgeon: Marinus Maw, MD;  Location: [facility] INVASIVE CV LAB;  Service: Cardiovascular;  Laterality: N/A;     Family History  Problem Relation Age of Onset  . Diabetes Mellitus II Neg Hx   . CAD Neg Hx      Social History   Socioeconomic History  . Marital status: Divorced    Spouse name: Not on file  . Number of children: Not on file  . Years of education: Not on file  . Highest education level: Not on file  Occupational History  . Not on file  Social Needs  . Financial resource strain: Not on file  . Food insecurity    Worry: Not on file    Inability: Not on file  . Transportation needs    Medical: Not on file    Non-medical: Not on file  Tobacco Use  . Smoking status: Current Some Day Smoker    Packs/day: 0.50    Types: Cigarettes  . Smokeless tobacco: Never Used  Substance and Sexual Activity  . Alcohol use: Yes    Alcohol/week: 0.0 standard drinks    Comment: Two drinks a night.  . Drug use: No    Comment: No history of cocaine use  . Sexual activity: Not on file  Lifestyle  . Physical activity    Days per week: Not on file    Minutes per session: Not on file  . Stress: Not on file  Relationships  . Social  connections    Talks on phone: Not on file    Gets together: Not on file    Attends religious service: Not on file    Active member of club or organization: Not on file    Attends meetings of clubs or organizations: Not on file    Relationship status: Not on file  . Intimate partner violence    Fear of current or ex partner: Not on file    Emotionally abused: Not on file    Physically abused: Not on file    Forced sexual activity: Not on file  Other Topics Concern  . Not on file  Social History Narrative  . Not on file     BP (!) 158/90   Pulse (!) 107   Ht 5\' 5"  (1.651 m)   Wt 168 lb (76.2 kg)   SpO2 98%   BMI 27.96 kg/m   Physical Exam:  Well appearing NAD HEENT: Unremarkable Neck:  No JVD, no thyromegally Lymphatics:  No adenopathy Back:  No CVA  tenderness Lungs:  Clear with no wheezes HEART:  Regular rate rhythm, no murmurs, no rubs, no clicks Abd:  soft, positive bowel sounds, no organomegally, no rebound, no guarding Ext:  2 plus pulses, no edema, no cyanosis, no clubbing Skin:  No rashes no nodules Neuro:  CN II through XII intact, motor grossly intact  EKG - sinus tachycardia with anterolateral MI DEVICE  Normal device function.  See PaceArt for details.   Assess/Plan: 1. Chronic systolic heart failure - her symptoms are class 2. She will continue her current meds. 2. ICD - her medtronic single chamber ICD is working normally. She does have some sinus tachy and today we change her zones from a monitor zone from 160 to 175, a VT zone from 200 - 230 and a VF zone at 230 above that. 3. CAD - she denies anginal symptoms.   Mikle Bosworth.D.

## 2019-01-07 NOTE — Patient Instructions (Addendum)
Medication Instructions:  Your physician recommends that you continue on your current medications as directed. Please refer to the Current Medication list given to you today.  Labwork: None ordered.  Testing/Procedures: None ordered.  Follow-Up: Your physician wants you to follow-up in: 9 months with Dr. Lovena Le.   You will receive a reminder letter in the mail two months in advance. If you don't receive a letter, please call our office to schedule the follow-up appointment.  Remote monitoring is used to monitor your ICD from home. This monitoring reduces the number of office visits required to check your device to one time per year. It allows [place] to keep an eye on the functioning of your device to ensure it is working properly. You are scheduled for a device check from home on 03/30/2019. You may send your transmission at any time that day. If you have a wireless device, the transmission will be sent automatically. After your physician reviews your transmission, you will receive a postcard with your next transmission date.  Any Other Special Instructions Will Be Listed Below (If Applicable).  If you need a refill on your cardiac medications before your next appointment, please call your pharmacy.

## 2019-01-21 ENCOUNTER — Other Ambulatory Visit: Payer: Self-pay | Admitting: Cardiology

## 2019-01-23 NOTE — Progress Notes (Signed)
HPI: FU coronary artery disease. Patient previously admitted with abdominal pain. Abdominal CT showed thrombus at the LV apex. Echocardiogram May 2017 showed ejection fraction 35-40% with anterior and apical wall motion abnormality. Grade 2 diastolic dysfunction. Thrombus at the apex. Mild mitral regurgitation and mild left atrial enlargement. Cardiac catheterization May 2017 showed a 90% proximal to mid LAD, 60% ramus and 50% right coronary artery. It was felt that she had likely infarcted the LAD territory and medical therapy recommended unless she has recurrent symptoms. She was placed on apixaban. Readmitted July 2017 and cardiac cath showed a 70% ramus, 50% followed by 55% LAD and occluded right coronary artery. Ejection 30-35%. Apical thrombus noted.Cardiac MRI July 2017 showed ejection fraction 35%. There was a large apical thrombus. There was mild mitral regurgitation. Patient seen by Dr. Cornelius Moras October 2017.Medical therapy recommended. There has been problems with compliance. Last echocardiogram May 2020 showed ejection fraction 30 to 35%, moderate left ventricular hypertrophy.  Had ICD implanted June 2020.  Since last seen, she denies dyspnea, chest pain, palpitations or syncope.  Current Outpatient Medications  Medication Sig Dispense Refill  . apixaban (ELIQUIS) 5 MG TABS tablet Take 1 tablet (5 mg total) by mouth 2 (two) times daily. 180 tablet 3  . metoprolol succinate (TOPROL-XL) 100 MG 24 hr tablet TAKE 1 TABLET EVERY DAY 90 tablet 0  . pantoprazole (PROTONIX) 40 MG tablet Take 1 tablet (40 mg total) by mouth daily. 90 tablet 3  . sacubitril-valsartan (ENTRESTO) 24-26 MG Take 1 tablet by mouth 2 (two) times daily. 60 tablet 12  . nitroGLYCERIN (NITROSTAT) 0.4 MG SL tablet Place 1 tablet (0.4 mg total) under the tongue every 5 (five) minutes as needed for chest pain. 75 tablet 1   No current facility-administered medications for this visit.      Past Medical History:   Diagnosis Date  . Anticoagulation adequate 10/14/2015  . CAD (coronary artery disease)   . Coronary atherosclerosis of native coronary artery, multivessel with plans for CABG to be followed   08/04/2015  . Ischemic cardiomyopathy   . UTI (urinary tract infection) 10/14/2015    Past Surgical History:  Procedure Laterality Date  . CARDIAC CATHETERIZATION N/A 08/04/2015   Procedure: Coronary/Graft Angiography;  Surgeon: Peter M Swaziland, MD;  Location: [facility] INVASIVE CV LAB;  Service: Cardiovascular;  Laterality: N/A;  . CARDIAC CATHETERIZATION N/A 10/10/2015   Procedure: Left Heart Cath and Coronary Angiography;  Surgeon: Lennette Bihari, MD;  Location: [facility] INVASIVE CV LAB;  Service: Cardiovascular;  Laterality: N/A;  . CESAREAN SECTION    . ICD IMPLANT N/A 09/29/2018   Procedure: ICD IMPLANT;  Surgeon: Marinus Maw, MD;  Location: [facility] INVASIVE CV LAB;  Service: Cardiovascular;  Laterality: N/A;    Social History   Socioeconomic History  . Marital status: Divorced    Spouse name: Not on file  . Number of children: Not on file  . Years of education: Not on file  . Highest education level: Not on file  Occupational History  . Not on file  Social Needs  . Financial resource strain: Not on file  . Food insecurity    Worry: Not on file    Inability: Not on file  . Transportation needs    Medical: Not on file    Non-medical: Not on file  Tobacco Use  . Smoking status: Current Some Day Smoker    Packs/day: 0.50    Types: Cigarettes  . Smokeless tobacco: Never  Used  Substance and Sexual Activity  . Alcohol use: Yes    Alcohol/week: 0.0 standard drinks    Comment: Two drinks a night.  . Drug use: No    Comment: No history of cocaine use  . Sexual activity: Not on file  Lifestyle  . Physical activity    Days per week: Not on file    Minutes per session: Not on file  . Stress: Not on file  Relationships  . Social Herbalist on phone: Not on file    Gets together: Not on file     Attends religious service: Not on file    Active member of club or organization: Not on file    Attends meetings of clubs or organizations: Not on file    Relationship status: Not on file  . Intimate partner violence    Fear of current or ex partner: Not on file    Emotionally abused: Not on file    Physically abused: Not on file    Forced sexual activity: Not on file  Other Topics Concern  . Not on file  Social History Narrative  . Not on file    Family History  Problem Relation Age of Onset  . Diabetes Mellitus II Neg Hx   . CAD Neg Hx     ROS: no fevers or chills, productive cough, hemoptysis, dysphasia, odynophagia, melena, hematochezia, dysuria, hematuria, rash, seizure activity, orthopnea, PND, pedal edema, claudication. Remaining systems are negative.  Physical Exam: Well-developed well-nourished in no acute distress.  Skin is warm and dry.  HEENT is normal.  Neck is supple.  Chest is clear to auscultation with normal expansion.  Cardiovascular exam is regular rate and rhythm.  Abdominal exam nontender or distended. No masses palpated. Extremities show no edema. neuro grossly intact  ECG-sinus tachycardia at a rate of 127, left ventricular hypertrophy, biatrial enlargement, personally reviewed  A/P  1 coronary artery disease-patient denies chest pain or dyspnea.  Plan to continue medical therapy with statin.  She is not on aspirin given need for anticoagulation.  2 history of left ventricular apical thrombus-continue present dose of apixaban.  Check hemoglobin and renal function.  3 ischemic cardiomyopathy-continue Entresto and beta-blocker.  4 prior ICD-followed by Dr. Lovena Le.  5 tobacco abuse-patient counseled on discontinuing.  6 hyperlipidemia-continue statin.  Check lipids and liver.   Kirk Ruths, MD

## 2019-01-27 ENCOUNTER — Encounter: Payer: Self-pay | Admitting: Cardiology

## 2019-01-27 ENCOUNTER — Ambulatory Visit (INDEPENDENT_AMBULATORY_CARE_PROVIDER_SITE_OTHER): Payer: Medicare HMO | Admitting: Cardiology

## 2019-01-27 ENCOUNTER — Other Ambulatory Visit: Payer: Self-pay

## 2019-01-27 VITALS — BP 96/70 | HR 127 | Temp 98.1°F | Ht 63.0 in | Wt 160.0 lb

## 2019-01-27 DIAGNOSIS — I2510 Atherosclerotic heart disease of native coronary artery without angina pectoris: Secondary | ICD-10-CM

## 2019-01-27 DIAGNOSIS — I255 Ischemic cardiomyopathy: Secondary | ICD-10-CM | POA: Diagnosis not present

## 2019-01-27 DIAGNOSIS — Z95810 Presence of automatic (implantable) cardiac defibrillator: Secondary | ICD-10-CM | POA: Diagnosis not present

## 2019-01-27 DIAGNOSIS — I513 Intracardiac thrombosis, not elsewhere classified: Secondary | ICD-10-CM

## 2019-01-27 DIAGNOSIS — E7800 Pure hypercholesterolemia, unspecified: Secondary | ICD-10-CM

## 2019-01-27 NOTE — Patient Instructions (Signed)
Medication Instructions:  NO CHANGE *If you need a refill on your cardiac medications before your next appointment, please call your pharmacy*  Lab Work: Your physician recommends that you return for lab work PRIOR TO EATING  If you have labs (blood work) drawn today and your tests are completely normal, you will receive your results only by: . MyChart Message (if you have MyChart) OR . A paper copy in the mail If you have any lab test that is abnormal or we need to change your treatment, we will call you to review the results.  Follow-Up: At [facility], you and your health needs are our priority.  As part of our continuing mission to provide you with exceptional heart care, we have created designated Provider Care Teams.  These Care Teams include your primary Cardiologist (physician) and Advanced Practice Providers (APPs -  Physician Assistants and Nurse Practitioners) who all work together to provide you with the care you need, when you need it.  Your next appointment:   6 month(s)  The format for your next appointment:   Virtual Visit   Provider:   Brian Crenshaw, MD     

## 2019-02-03 ENCOUNTER — Other Ambulatory Visit: Payer: Self-pay | Admitting: Cardiology

## 2019-02-28 ENCOUNTER — Telehealth: Payer: Self-pay | Admitting: Cardiology

## 2019-02-28 NOTE — Telephone Encounter (Signed)
   Patient called reporting she recently returned home from a trip out of town and her home ICD latitude monitor was blinking yellow. She unplugged it and plugged it back in and it turned green again. She reports no shocks or other abnormalities with the device, also reports no symptoms, no chest pain, dyspnea, orthopnea, PND, near-syncope, or syncope.   I called [employer] support and spoke to "Ronalee Belts" and he said there are several reasons her home monitor may flash yellow, none of which should have anything to do with a device problem. It was most likely due to a "sending data problem" that usually resolves with a reboot, and it sounds like her reboot solved the problem. They also said tonight the device is transmitting everything appropriately and looks good on their end.   Caller verbalized understanding and was grateful for the call back.  Thomasena Edis, MD 02/28/2019, 9:06 PM

## 2019-02-28 NOTE — Plan of Care (Signed)
   Patient called reporting she recently returned home from a trip out of town and her home ICD latitude monitor was blinking yellow. She unplugged it and plugged it back in and it turned green again. She reports no shocks or other abnormalities with the device, also reports no symptoms, no chest pain, dyspnea, orthopnea, PND, near-syncope, or syncope.   I called [employer] support and spoke to "Ronalee Belts" and he said there are several reasons her home monitor may flash yellow, none of which should have anything to do with a device problem. It was most likely due to a "sending data problem" that usually resolves with a reboot, and it sounds like her reboot solved the problem. They also said tonight the device is transmitting everything appropriately and looks good on their end.   Caller verbalized understanding and was grateful for the call back.  Ortencia Kick Niva Murren, NP 02/28/2019, 8:36 PM

## 2019-03-11 ENCOUNTER — Other Ambulatory Visit: Payer: Self-pay | Admitting: Cardiology

## 2019-03-11 DIAGNOSIS — I739 Peripheral vascular disease, unspecified: Secondary | ICD-10-CM

## 2019-03-11 DIAGNOSIS — I255 Ischemic cardiomyopathy: Secondary | ICD-10-CM

## 2019-03-30 ENCOUNTER — Ambulatory Visit (INDEPENDENT_AMBULATORY_CARE_PROVIDER_SITE_OTHER): Payer: Medicare HMO | Admitting: *Deleted

## 2019-03-30 DIAGNOSIS — I255 Ischemic cardiomyopathy: Secondary | ICD-10-CM | POA: Diagnosis not present

## 2019-03-30 LAB — CUP PACEART REMOTE DEVICE CHECK
Date Time Interrogation Session: 20201228135709
Implantable Lead Implant Date: 20200629
Implantable Lead Location: 753860
Implantable Lead Model: 292
Implantable Lead Serial Number: 448526
Implantable Pulse Generator Implant Date: 20200629
Pulse Gen Serial Number: 262129

## 2019-04-30 ENCOUNTER — Other Ambulatory Visit: Payer: Self-pay | Admitting: Cardiology

## 2019-04-30 DIAGNOSIS — I255 Ischemic cardiomyopathy: Secondary | ICD-10-CM

## 2019-05-08 ENCOUNTER — Other Ambulatory Visit: Payer: Self-pay | Admitting: Cardiology

## 2019-05-08 DIAGNOSIS — I255 Ischemic cardiomyopathy: Secondary | ICD-10-CM

## 2019-05-08 DIAGNOSIS — I739 Peripheral vascular disease, unspecified: Secondary | ICD-10-CM

## 2019-05-11 NOTE — Telephone Encounter (Signed)
Rx has been sent to the pharmacy electronically. ° °

## 2019-06-29 ENCOUNTER — Ambulatory Visit (INDEPENDENT_AMBULATORY_CARE_PROVIDER_SITE_OTHER): Payer: Medicare HMO | Admitting: *Deleted

## 2019-06-29 DIAGNOSIS — I255 Ischemic cardiomyopathy: Secondary | ICD-10-CM

## 2019-06-29 LAB — CUP PACEART REMOTE DEVICE CHECK
Battery Remaining Longevity: 180 mo
Battery Remaining Percentage: 100 %
Brady Statistic RV Percent Paced: 0 %
Date Time Interrogation Session: 20210329002100
HighPow Impedance: 59 Ohm
Implantable Lead Implant Date: 20200629
Implantable Lead Location: 753860
Implantable Lead Model: 292
Implantable Lead Serial Number: 448526
Implantable Pulse Generator Implant Date: 20200629
Lead Channel Impedance Value: 543 Ohm
Lead Channel Pacing Threshold Amplitude: 1 V
Lead Channel Pacing Threshold Pulse Width: 0.4 ms
Lead Channel Setting Pacing Amplitude: 2.2 V
Lead Channel Setting Pacing Pulse Width: 0.4 ms
Lead Channel Setting Sensing Sensitivity: 0.6 mV
Pulse Gen Serial Number: 262129

## 2019-06-29 NOTE — Progress Notes (Signed)
ICD Remote  

## 2019-07-30 NOTE — Progress Notes (Signed)
Virtual Visit via Video Note changed to phone visit at patient request   This visit type was conducted due to national recommendations for restrictions regarding the COVID-19 Pandemic (e.g. social distancing) in an effort to limit this patient's exposure and mitigate transmission in our community.  Due to her co-morbid illnesses, this patient is at least at moderate risk for complications without adequate follow up.  This format is felt to be most appropriate for this patient at this time.  All issues noted in this document were discussed and addressed.  A limited physical exam was performed with this format.  Please refer to the patient's chart for her consent to telehealth for [facility].   Date:  07/31/2019   ID:  Casey Casey [name], DOB June 02, 1970, MRN [id number]  Patient Location:Home Provider Location: Home  PCP:  Patient, Casey [name] Pcp Per  Cardiologist:  Dr Jens Som  Evaluation Performed:  Follow-Up Visit  Chief Complaint:  FU CAD  History of Present Illness:    FU coronary artery disease. Patient previously admitted with abdominal pain. Abdominal CT showed thrombus at the LV apex. Echocardiogram May 2017 showed ejection fraction 35-40% with anterior and apical wall motion abnormality. Grade 2 diastolic dysfunction. Thrombus at the apex. Mild mitral regurgitation and mild left atrial enlargement. Cardiac catheterization May 2017 showed a 90% proximal to mid LAD, 60% ramus and 50% right coronary artery. It was felt that she had likely infarcted the LAD territory and medical therapy recommended unless she has recurrent symptoms. She was placed on apixaban. Readmitted July 2017 and cardiac cath showed a 70% ramus, 50% followed by 55% LAD and occluded right coronary artery. Ejection 30-35%. Apical thrombus noted.Cardiac MRI July 2017 showed ejection fraction 35%. There was a large apical thrombus. There was mild mitral regurgitation. Patient seen by Dr. Cornelius Moras October 2017.Medical therapy  recommended. There has been problems with compliance. Last echocardiogram May 2020 showed ejection fraction 30 to 35%, moderate left ventricular hypertrophy.  Had ICD implanted June 2020. Since last seen, the patient denies any dyspnea on exertion, orthopnea, PND, pedal edema, palpitations, syncope or chest pain.   The patient does not have symptoms concerning for COVID-19 infection (fever, chills, cough, or new shortness of breath).    Past Medical History:  Diagnosis Date  . Anticoagulation adequate 10/14/2015  . CAD (coronary artery disease)   . Coronary atherosclerosis of native coronary artery, multivessel with plans for CABG to be followed   08/04/2015  . Ischemic cardiomyopathy   . UTI (urinary tract infection) 10/14/2015   Past Surgical History:  Procedure Laterality Date  . CARDIAC CATHETERIZATION N/A 08/04/2015   Procedure: Coronary/Graft Angiography;  Surgeon: Peter M Swaziland, MD;  Location[facility] INVASIVE CV LAB;  Service: Cardiovascular;  Laterality: N/A;  . CARDIAC CATHETERIZATION N/A 10/10/2015   Procedure: Left Heart Cath and Coronary Angiography;  Surgeon: Lennette Bihari, MD;  Location: [facility] INVASIVE CV LAB;  Service: Cardiovascular;  Laterality: N/A;  . CESAREAN SECTION    . ICD IMPLANT N/A 09/29/2018   Procedure: ICD IMPLANT;  Surgeon: Marinus Maw, MD;  Location: [facility] INVASIVE CV LAB;  Service: Cardiovascular;  Laterality: N/A;     Current Meds  Medication Sig  . apixaban (ELIQUIS) 5 MG TABS tablet Take 1 tablet (5 mg total) by mouth 2 (two) times daily.  Marland Kitchen atorvastatin (LIPITOR) 80 MG tablet Take 1 tablet (80 mg total) by mouth daily.  . metoprolol succinate (TOPROL-XL) 100 MG 24 hr tablet TAKE 1 TABLET EVERY DAY  .  nitroGLYCERIN (NITROSTAT) 0.4 MG SL tablet Place 1 tablet (0.4 mg total) under the tongue every 5 (five) minutes as needed for chest pain.  . pantoprazole (PROTONIX) 40 MG tablet TAKE 1 TABLET (40 MG TOTAL) BY MOUTH DAILY.  . sacubitril-valsartan (ENTRESTO) 24-26  MG Take 1 tablet by mouth 2 (two) times daily.     Allergies:   Patient has Casey [name] known allergies.   Social History   Tobacco Use  . Smoking status: Current Some Day Smoker    Packs/day: 0.50    Types: Cigarettes  . Smokeless tobacco: Never Used  Substance Use Topics  . Alcohol use: Yes    Alcohol/week: 0.0 standard drinks    Comment: Two drinks a night.  . Drug use: Casey [name]    Comment: Casey [name] history of cocaine use     Family Hx: The patient's family history is negative for Diabetes Mellitus II and CAD.  ROS:   Please see the history of present illness.    Casey [name] Fever, chills  or productive cough All other systems reviewed and are negative.   Recent Labs: 09/29/2018: BUN 5; Creatinine, Ser 0.62; Hemoglobin 9.9; Platelets 292; Potassium 3.3; Sodium 137   Recent Lipid Panel Lab Results  Component Value Date/Time   CHOL 126 04/24/2018 10:46 AM   TRIG 87 04/24/2018 10:46 AM   HDL 45 04/24/2018 10:46 AM   CHOLHDL 2.8 04/24/2018 10:46 AM   CHOLHDL 2.6 10/14/2015 03:57 AM   LDLCALC 64 04/24/2018 10:46 AM    Wt Readings from Last 3 Encounters:  07/31/19 155 lb (70.3 kg)  01/27/19 160 lb (72.6 kg)  01/07/19 168 lb (76.2 kg)     Objective:    Vital Signs:  Ht 5\' 5"  (1.651 m)   Wt 155 lb (70.3 kg)   BMI 25.79 kg/m    VITAL SIGNS:  reviewed NAD Answers questions appropriately Normal affect Remainder of physical examination not performed (telehealth visit; coronavirus pandemic)  ASSESSMENT & PLAN:    1. Coronary artery disease-patient doing well with Casey [name] chest pain or dyspnea.  Continue medical therapy with statin.  She is not on aspirin given need for anticoagulation. 2. Ischemic cardiomyopathy-continue Entresto and beta-blocker. 3. Left ventricular apical thrombus-continue apixaban.  Check hemoglobin and renal function. 4. Hyperlipidemia-continue statin.  Check lipids and liver.   5. Prior ICD-followed by electrophysiology. 6. Tobacco abuse-patient counseled on  discontinuing.  COVID-19 Education: The importance of social distancing was discussed today.  Time:   Today, I have spent 16 minutes with the patient with telehealth technology discussing the above problems.     Medication Adjustments/Labs and Tests Ordered: Current medicines are reviewed at length with the patient today.  Concerns regarding medicines are outlined above.   Tests Ordered: Casey [name] orders of the defined types were placed in this encounter.   Medication Changes: Casey [name] orders of the defined types were placed in this encounter.   Follow Up:  In Person in 6 month(s)  Signed, Kirk Ruths, MD  07/31/2019 9:22 AM    [facility]

## 2019-07-31 ENCOUNTER — Encounter: Payer: Self-pay | Admitting: Cardiology

## 2019-07-31 ENCOUNTER — Telehealth (INDEPENDENT_AMBULATORY_CARE_PROVIDER_SITE_OTHER): Payer: Medicare HMO | Admitting: Cardiology

## 2019-07-31 VITALS — Ht 65.0 in | Wt 155.0 lb

## 2019-07-31 DIAGNOSIS — I255 Ischemic cardiomyopathy: Secondary | ICD-10-CM | POA: Diagnosis not present

## 2019-07-31 DIAGNOSIS — I513 Intracardiac thrombosis, not elsewhere classified: Secondary | ICD-10-CM

## 2019-07-31 DIAGNOSIS — E7800 Pure hypercholesterolemia, unspecified: Secondary | ICD-10-CM | POA: Diagnosis not present

## 2019-07-31 DIAGNOSIS — I2510 Atherosclerotic heart disease of native coronary artery without angina pectoris: Secondary | ICD-10-CM

## 2019-07-31 DIAGNOSIS — Z720 Tobacco use: Secondary | ICD-10-CM

## 2019-07-31 NOTE — Patient Instructions (Signed)
Medication Instructions:  NO CHANGE *If you need a refill on your cardiac medications before your next appointment, please call your pharmacy*   Lab Work: Your physician recommends that you return for lab work WHEN CONVENIENT If you have labs (blood work) drawn today and your tests are completely normal, you will receive your results only by: . MyChart Message (if you have MyChart) OR . A paper copy in the mail If you have any lab test that is abnormal or we need to change your treatment, we will call you to review the results.  Follow-Up: At [facility], you and your health needs are our priority.  As part of our continuing mission to provide you with exceptional heart care, we have created designated Provider Care Teams.  These Care Teams include your primary Cardiologist (physician) and Advanced Practice Providers (APPs -  Physician Assistants and Nurse Practitioners) who all work together to provide you with the care you need, when you need it.  We recommend signing up for the patient portal called "MyChart".  Sign up information is provided on this After Visit Summary.  MyChart is used to connect with patients for Virtual Visits (Telemedicine).  Patients are able to view lab/test results, encounter notes, upcoming appointments, etc.  Non-urgent messages can be sent to your provider as well.   To learn more about what you can do with MyChart, go to [email or web address].    Your next appointment:   6 month(s)  The format for your next appointment:   Either In Person or Virtual  Provider:   You may see Brian Crenshaw, MD or one of the following Advanced Practice Providers on your designated Care Team:    Luke Kilroy, PA-C  Callie Goodrich, PA-C  Jesse Cleaver, FNP     

## 2019-08-13 LAB — CBC
Hematocrit: 31.6 % — ABNORMAL LOW (ref 34.0–46.6)
Hemoglobin: 10.8 g/dL — ABNORMAL LOW (ref 11.1–15.9)
MCH: 32.9 pg (ref 26.6–33.0)
MCHC: 34.2 g/dL (ref 31.5–35.7)
MCV: 96 fL (ref 79–97)
Platelets: 180 10*3/uL (ref 150–450)
RBC: 3.28 x10E6/uL — ABNORMAL LOW (ref 3.77–5.28)
RDW: 19.7 % — ABNORMAL HIGH (ref 11.7–15.4)
WBC: 5.3 10*3/uL (ref 3.4–10.8)

## 2019-08-13 LAB — LIPID PANEL
Chol/HDL Ratio: 2.2 ratio (ref 0.0–4.4)
Cholesterol, Total: 144 mg/dL (ref 100–199)
HDL: 65 mg/dL (ref >39)
LDL Chol Calc (NIH): 63 mg/dL (ref 0–99)
Triglycerides: 84 mg/dL (ref 0–149)
VLDL Cholesterol Cal: 16 mg/dL (ref 5–40)

## 2019-08-13 LAB — COMPREHENSIVE METABOLIC PANEL
ALT: 13 IU/L (ref 0–32)
AST: 22 IU/L (ref 0–40)
Albumin/Globulin Ratio: 1.5 (ref 1.2–2.2)
Albumin: 4.1 g/dL (ref 3.8–4.8)
Alkaline Phosphatase: 63 IU/L (ref 39–117)
BUN/Creatinine Ratio: 6 — ABNORMAL LOW (ref 9–23)
BUN: 4 mg/dL — ABNORMAL LOW (ref 6–24)
Bilirubin Total: 0.6 mg/dL (ref 0.0–1.2)
CO2: 21 mmol/L (ref 20–29)
Calcium: 9.5 mg/dL (ref 8.7–10.2)
Chloride: 100 mmol/L (ref 96–106)
Creatinine, Ser: 0.72 mg/dL (ref 0.57–1.00)
GFR calc Af Amer: 115 mL/min/{1.73_m2} (ref >59)
GFR calc non Af Amer: 99 mL/min/{1.73_m2} (ref >59)
Globulin, Total: 2.8 g/dL (ref 1.5–4.5)
Glucose: 84 mg/dL (ref 65–99)
Potassium: 3.9 mmol/L (ref 3.5–5.2)
Sodium: 139 mmol/L (ref 134–144)
Total Protein: 6.9 g/dL (ref 6.0–8.5)

## 2019-08-14 ENCOUNTER — Encounter: Payer: Self-pay | Admitting: *Deleted

## 2019-09-16 ENCOUNTER — Telehealth: Payer: Self-pay | Admitting: Cardiology

## 2019-09-16 MED ORDER — ATORVASTATIN CALCIUM 80 MG PO TABS: 80.0000 mg | ORAL_TABLET | Freq: Every day | ORAL | 3 refills | Status: DC

## 2019-09-16 NOTE — Telephone Encounter (Signed)
New Message   *STAT* If patient is at the pharmacy, call can be transferred to refill team.   1. Which medications need to be refilled? (please list name of each medication and dose if known) atorvastatin (LIPITOR) 80 MG tablet  2. Which pharmacy/location (including street and city if local pharmacy) is medication to be sent to? [facility] - [place], [place] - [address]  3. Do they need a 30 day or 90 day supply? 90 day  

## 2019-09-18 ENCOUNTER — Other Ambulatory Visit: Payer: Self-pay

## 2019-09-28 ENCOUNTER — Ambulatory Visit (INDEPENDENT_AMBULATORY_CARE_PROVIDER_SITE_OTHER): Payer: Medicare HMO | Admitting: *Deleted

## 2019-09-28 DIAGNOSIS — I255 Ischemic cardiomyopathy: Secondary | ICD-10-CM

## 2019-09-29 LAB — CUP PACEART REMOTE DEVICE CHECK
Battery Remaining Longevity: 180 mo
Battery Remaining Percentage: 100 %
Brady Statistic RV Percent Paced: 0 %
Date Time Interrogation Session: 20210628002100
HighPow Impedance: 64 Ohm
Implantable Lead Implant Date: 20200629
Implantable Lead Location: 753860
Implantable Lead Model: 292
Implantable Lead Serial Number: 448526
Implantable Pulse Generator Implant Date: 20200629
Lead Channel Impedance Value: 576 Ohm
Lead Channel Pacing Threshold Amplitude: 0.9 V
Lead Channel Pacing Threshold Pulse Width: 0.4 ms
Lead Channel Setting Pacing Amplitude: 2 V
Lead Channel Setting Pacing Pulse Width: 0.4 ms
Lead Channel Setting Sensing Sensitivity: 0.6 mV
Pulse Gen Serial Number: 262129

## 2019-09-30 NOTE — Progress Notes (Signed)
Remote ICD transmission.   

## 2019-11-26 ENCOUNTER — Other Ambulatory Visit: Payer: Self-pay | Admitting: Cardiology

## 2019-11-26 NOTE — Telephone Encounter (Signed)
Rx has been sent to the pharmacy electronically. ° °

## 2019-12-02 ENCOUNTER — Other Ambulatory Visit: Payer: Self-pay | Admitting: Cardiology

## 2019-12-02 DIAGNOSIS — I739 Peripheral vascular disease, unspecified: Secondary | ICD-10-CM

## 2019-12-02 DIAGNOSIS — I255 Ischemic cardiomyopathy: Secondary | ICD-10-CM

## 2019-12-28 ENCOUNTER — Ambulatory Visit (INDEPENDENT_AMBULATORY_CARE_PROVIDER_SITE_OTHER): Payer: Medicare HMO | Admitting: Emergency Medicine

## 2019-12-28 DIAGNOSIS — I255 Ischemic cardiomyopathy: Secondary | ICD-10-CM | POA: Diagnosis not present

## 2019-12-29 LAB — CUP PACEART REMOTE DEVICE CHECK
Battery Remaining Longevity: 180 mo
Battery Remaining Percentage: 100 %
Brady Statistic RV Percent Paced: 0 %
Date Time Interrogation Session: 20210927002000
HighPow Impedance: 57 Ohm
Implantable Lead Implant Date: 20200629
Implantable Lead Location: 753860
Implantable Lead Model: 292
Implantable Lead Serial Number: 448526
Implantable Pulse Generator Implant Date: 20200629
Lead Channel Impedance Value: 555 Ohm
Lead Channel Pacing Threshold Amplitude: 1 V
Lead Channel Pacing Threshold Pulse Width: 0.4 ms
Lead Channel Setting Pacing Amplitude: 2 V
Lead Channel Setting Pacing Pulse Width: 0.4 ms
Lead Channel Setting Sensing Sensitivity: 0.6 mV
Pulse Gen Serial Number: 262129

## 2019-12-30 NOTE — Progress Notes (Signed)
Remote ICD transmission.   

## 2020-01-27 NOTE — Progress Notes (Signed)
Virtual Visit via Telephone Note   This visit type was conducted due to national recommendations for restrictions regarding the COVID-19 Pandemic (e.g. social distancing) in an effort to limit this patient's exposure and mitigate transmission in our community.  Due to her co-morbid illnesses, this patient is at least at moderate risk for complications without adequate follow up.  This format is felt to be most appropriate for this patient at this time.  The patient did not have access to video technology/had technical difficulties with video requiring transitioning to audio format only (telephone).  All issues noted in this document were discussed and addressed.  [name] physical exam could be performed with this format.  Please refer to the patient's chart for her  consent to telehealth for [facility].  Evaluation Performed:  Follow-up visit  This visit type was conducted due to national recommendations for restrictions regarding the COVID-19 Pandemic (e.g. social distancing).  This format is felt to be most appropriate for this patient at this time.  All issues noted in this document were discussed and addressed.  [name] physical exam was performed (except for noted visual exam findings with Video Visits).  Please refer to the patient's chart (MyChart message for video visits and phone note for telephone visits) for the patient's consent to telehealth for [facility]  Date:  01/28/2020   ID:  Casey [name], DOB 09-22-1970, MRN [id number]  Patient Location:  [address] [place] [address]   Provider location:     [facility] [address] Office [phone] Fax [phone]   PCP:  Patient, [name] Pcp Per  Cardiologist:  Olga Millers, MD   Electrophysiologist:  None   Chief Complaint: Follow-up ischemic cardiomyopathy/CAD  History of Present Illness:    Casey [name] is a 49 y.o. female who presents via  audio/video conferencing for a telehealth visit today.  Patient verified DOB and address.  She has a PMH of mural thrombus, cardiomyopathy, CAD status post CABG, and NSTEMI.  She was last seen by Dr. Jens Som on 07/31/2019.  She was previously admitted for abdominal pain.  Her abdominal CT showed thrombus at the LV apex.  An echocardiogram 5/17 showed an ejection fraction of 35-40% with anterior and apical wall motion abnormalities, G2 DD, thrombus at the apex, mild mitral regurgitation, and mild left atrial enlargement.  She underwent cardiac catheterization 5/17 which showed a 90% proximal to mid LAD, 60% ramus and 50% RCA.  It was felt that she had likely had previous infarct in LAD territory.  Medical therapy was recommended.  She was placed on apixaban at that time.  She was readmitted 7/17 and cardiac cath showed 70% ramus, 50% followed by 55% LAD and occluded RCA.  EF 30-35%.  Apical thrombus noted.  Cardiac MRI 7/17 showed EF of 35% and a large apical thrombus.  There was mild mitral regurgitation.  She was seen by Dr. Cornelius Moras 10/17 and medical management was recommended.  She does have problems with compliance.  Her last echocardiogram 5/20 showed an ejection fraction of 30-35%, moderate LVH.  She had ICD implanted 6/20.  When she was last seen by Dr. Jens Som she denied dyspnea on exertion, orthopnea, PND, lower extremity edema, palpitations, syncope and chest discomfort.  She is seen virtually today in follow-up and states she feels well.  She has been packing up her house because she is moving.  She continues to be physically active walking every  day if not every other day for 30 minutes at a time.  She has been following a low-salt diet and reports compliance with her Eliquis.  We reviewed her cardiac catheterization from 2017 and her most recent echocardiogram.  I will have her follow-up in 6 months, continue physical activity, and give her the salty 6 diet sheet.  Today she denies chest pain,  shortness of breath, lower extremity edema, fatigue, palpitations, melena, hematuria, hemoptysis, diaphoresis, weakness, presyncope, syncope, orthopnea, and PND.   The patient does not symptoms concerning for COVID-19 infection (fever, chills, cough, or new SHORTNESS OF BREATH).    Prior CV studies:   The following studies were reviewed today:  Echocardiogram 08/13/2018 IMPRESSIONS    1. The left ventricle has moderate-severely reduced systolic function,  with an ejection fraction of 30-35%. The cavity size was normal. Moderate  hypertrophy of the basal septum with otherwise mild concentric LVH.Marland Kitchen Left  ventricular diastolic Doppler  parameters are consistent with impaired relaxation. Indeterminate filling  pressures.  2. Apical akinesis. Akinesis of the apical inferior, mid-apical  anteroseptum, mid-apical anterior myocarrdium. [name] evidence of residual  apical thrombus.  3. The right ventricle has normal systolic function. The cavity was  normal. There is [name] increase in right ventricular wall thickness.  4. Trivial pericardial effusion is present.  5. The aortic valve is tricuspid. Aortic valve regurgitation was not  assessed by color flow Doppler.    Past Medical History:  Diagnosis Date   Anticoagulation adequate 10/14/2015   CAD (coronary artery disease)    Coronary atherosclerosis of native coronary artery, multivessel with plans for CABG to be followed   08/04/2015   Ischemic cardiomyopathy    UTI (urinary tract infection) 10/14/2015   Past Surgical History:  Procedure Laterality Date   CARDIAC CATHETERIZATION N/A 08/04/2015   Procedure: Coronary/Graft Angiography;  Surgeon: Peter M Swaziland, MD;  Location: [facility] INVAS[facility] CV LAB;  Service: Cardiovascular;  Laterality: N/A;   CARDIAC CATHETERIZATION N/A 10/10/2015   Procedure: Left Heart Cath and Coronary Angiography;  Surgeon: Lennette Bihari, MD;  Location: [facility] INVASIVE CV LAB;  Service: Cardiovascular;  Laterality: N/A;     CESAREAN SECTION     ICD IMPLANT N/A 09/29/2018   Procedure: ICD IMPLANT;  Surgeon: Marinus Maw, MD;  Location: [facility] INVASIVE CV LAB;  Service: Cardiovascular;  Laterality: N/A;     Current Meds  Medication Sig   apixaban (ELIQUIS) 5 MG TABS tablet Take 1 tablet (5 mg total) by mouth 2 (two) times daily.   atorvastatin (LIPITOR) 80 MG tablet Take 1 tablet (80 mg total) by mouth daily.   metoprolol succinate (TOPROL-XL) 100 MG 24 hr tablet TAKE 1 TABLET EVERY DAY   pantoprazole (PROTONIX) 40 MG tablet TAKE 1 TABLET (40 MG TOTAL) BY MOUTH DAILY.   sacubitril-valsartan (ENTRESTO) 24-26 MG Take 1 tablet by mouth 2 (two) times daily.     Allergies:   Patient has [name] known allergies.   Social History   Tobacco Use   Smoking status: Current Some Day Smoker    Packs/day: 0.50    Types: Cigarettes   Smokeless tobacco: Never Used  Vaping Use   Vaping Use: Never used  Substance Use Topics   Alcohol use: Yes    Alcohol/week: 0.0 standard drinks    Comment: Two drinks a night.   Drug use: [name]    Comment: [name] history of cocaine use     Family Hx: The patient's family history is negative for Diabetes Mellitus  II and CAD.  ROS:   Please see the history of present illness.     All other systems reviewed and are negative.   Labs/Other Tests and Data Reviewed:    Recent Labs: 08/13/2019: ALT 13; BUN 4; Creatinine, Ser 0.72; Hemoglobin 10.8; Platelets 180; Potassium 3.9; Sodium 139   Recent Lipid Panel Lab Results  Component Value Date/Time   CHOL 144 08/13/2019 09:17 AM   TRIG 84 08/13/2019 09:17 AM   HDL 65 08/13/2019 09:17 AM   CHOLHDL 2.2 08/13/2019 09:17 AM   CHOLHDL 2.6 10/14/2015 03:57 AM   LDLCALC 63 08/13/2019 09:17 AM    Wt Readings from Last 3 Encounters:  01/28/20 155 lb (70.3 kg)  07/31/19 155 lb (70.3 kg)  01/27/19 160 lb (72.6 kg)     Exam:    Vital Signs:  Wt 155 lb (70.3 kg)    BMI 25.79 kg/m    Well nourished, well developed female in [name]   acute distress.   ASSESSMENT & PLAN:    1.  CAD-[name] chest pain today and [name] recent episodes of chest discomfort. Has been increasing her physical activity. Continue Lipitor, metoprolol, Entresto Heart healthy low-sodium diet-salty 6 given Increase physical activity as tolerated  Ischemic cardiomyopathy-[name] increased activity intolerance or DOE.  Last echocardiogram showed EF of 30-35%.  She underwent ICD placement 6/20 and follows with EP. Continue Entresto, metoprolol Heart healthy low-sodium diet-salty 6 given Increase physical activity as tolerated  Apical mural thrombus-on apixaban.  Denies bleeding episodes.  Last echocardiogram showed thrombus had resolved. Continue apixaban  HLD-08/13/2019: Cholesterol, Total 144; HDL 65; LDL Chol Calc (NIH) 63; Triglycerides 84 Continue atorvastatin Heart healthy low-sodium high-fiber diet Continue to increase physical activity as tolerated  Disposition: Follow-up with Dr. Jens Som or myself in 6 months.  COVID-19 Education: The signs and symptoms of COVID-19 were discussed with the patient and how to seek care for testing (follow up with PCP or arrange E-visit).  The importance of social distancing was discussed today.  Patient Risk:   After full review of this patients clinical status, I feel that they are at least moderate risk at this time.  Time:   Today, I have spent 8 minutes with the patient with telehealth technology discussing medication, echocardiogram, prior cardiac catheterization, and assessing cardiac symptoms.  I spent greater than 20 minutes reviewing patient's past medical history, previous cardiac tests, and prior cardiology notes.   Medication Adjustments/Labs and Tests Ordered: Current medicines are reviewed at length with the patient today.  Concerns regarding medicines are outlined above.   Tests Ordered: [name] orders of the defined types were placed in this encounter.  Medication Changes: [name] orders of the defined  types were placed in this encounter.   Disposition:  in 6 month(s)  Signed, Thomasene Ripple. Welborn Keena NP-C    11/04/2018 11:58 AM    [facility] [address] Office [phone] Fax [phone]

## 2020-01-28 ENCOUNTER — Encounter: Payer: Self-pay | Admitting: General Practice

## 2020-01-28 ENCOUNTER — Telehealth (INDEPENDENT_AMBULATORY_CARE_PROVIDER_SITE_OTHER): Payer: Medicare HMO | Admitting: General Practice

## 2020-01-28 VITALS — Wt 155.0 lb

## 2020-01-28 DIAGNOSIS — I2510 Atherosclerotic heart disease of native coronary artery without angina pectoris: Secondary | ICD-10-CM | POA: Diagnosis not present

## 2020-01-28 DIAGNOSIS — I513 Intracardiac thrombosis, not elsewhere classified: Secondary | ICD-10-CM

## 2020-01-28 DIAGNOSIS — E7800 Pure hypercholesterolemia, unspecified: Secondary | ICD-10-CM

## 2020-01-28 DIAGNOSIS — I255 Ischemic cardiomyopathy: Secondary | ICD-10-CM | POA: Diagnosis not present

## 2020-01-28 NOTE — Patient Instructions (Signed)
Medication Instructions:  The current medical regimen is effective;  continue present plan and medications as directed. Please refer to the Current Medication list given to you today.  *If you need a refill on your cardiac medications before your next appointment, please call your pharmacy*  Lab Work:   Testing/Procedures:  NONE    NONE  Special Instructions  PLEASE READ AND FOLLOW SALTY 6-ATTACHED  PLEASEMAINTAIN PHYSICAL ACTIVITY AS TOLERATED  Follow-Up: Your next appointment:  6 month(s) In Person with Olga Millers, MD OR IF UNAVAILABLE JESSE CLEAVER, FNP-C Please call our office 2 months in advance to schedule this appointment   At [facility], you and your health needs are our priority.  As part of our continuing mission to provide you with exceptional heart care, we have created designated Provider Care Teams.  These Care Teams include your primary Cardiologist (physician) and Advanced Practice Providers (APPs -  Physician Assistants and Nurse Practitioners) who all work together to provide you with the care you need, when you need it.  We recommend signing up for the patient portal called "MyChart".  Sign up information is provided on this After Visit Summary.  MyChart is used to connect with patients for Virtual Visits (Telemedicine).  Patients are able to view lab/test results, encounter notes, upcoming appointments, etc.  Non-urgent messages can be sent to your provider as well.   To learn more about what you can do with MyChart, go to [email or web address].

## 2020-02-16 ENCOUNTER — Other Ambulatory Visit: Payer: Self-pay | Admitting: *Deleted

## 2020-02-16 ENCOUNTER — Other Ambulatory Visit: Payer: Self-pay | Admitting: Cardiology

## 2020-02-16 MED ORDER — APIXABAN 5 MG PO TABS
5.0000 mg | ORAL_TABLET | Freq: Two times a day (BID) | ORAL | 1 refills | Status: DC
Start: 2020-02-16 — End: 2020-02-24

## 2020-02-16 NOTE — Telephone Encounter (Signed)
*  STAT* If patient is at the pharmacy, call can be transferred to refill team.   1. Which medications need to be refilled? (please list name of each medication and dose if known)  apixaban (ELIQUIS) 5 MG TABS tablet  2. Which pharmacy/location (including street and city if local pharmacy) is medication to be sent to? [facility] - [place], [place] - [address]  3. Do they need a 30 day or 90 day supply? 90 with refills   Patient is out of medication

## 2020-02-17 NOTE — Telephone Encounter (Signed)
Rx sent yesterday

## 2020-02-24 ENCOUNTER — Other Ambulatory Visit: Payer: Self-pay

## 2020-02-29 MED ORDER — APIXABAN 5 MG PO TABS
5.0000 mg | ORAL_TABLET | Freq: Two times a day (BID) | ORAL | 1 refills | Status: DC
Start: 2020-02-29 — End: 2020-09-26

## 2020-03-28 ENCOUNTER — Ambulatory Visit (INDEPENDENT_AMBULATORY_CARE_PROVIDER_SITE_OTHER): Payer: Medicare HMO

## 2020-03-28 DIAGNOSIS — I255 Ischemic cardiomyopathy: Secondary | ICD-10-CM | POA: Diagnosis not present

## 2020-03-28 LAB — CUP PACEART REMOTE DEVICE CHECK
Battery Remaining Longevity: 168 mo
Battery Remaining Percentage: 100 %
Brady Statistic RV Percent Paced: 0 %
Date Time Interrogation Session: 20211227002100
HighPow Impedance: 59 Ohm
Implantable Lead Implant Date: 20200629
Implantable Lead Location: 753860
Implantable Lead Model: 292
Implantable Lead Serial Number: 448526
Implantable Pulse Generator Implant Date: 20200629
Lead Channel Impedance Value: 547 Ohm
Lead Channel Pacing Threshold Amplitude: 0.9 V
Lead Channel Pacing Threshold Pulse Width: 0.4 ms
Lead Channel Setting Pacing Amplitude: 2 V
Lead Channel Setting Pacing Pulse Width: 0.4 ms
Lead Channel Setting Sensing Sensitivity: 0.6 mV
Pulse Gen Serial Number: 262129

## 2020-04-11 NOTE — Progress Notes (Signed)
Remote ICD transmission.   

## 2020-06-27 ENCOUNTER — Ambulatory Visit (INDEPENDENT_AMBULATORY_CARE_PROVIDER_SITE_OTHER): Payer: Medicare HMO

## 2020-06-27 DIAGNOSIS — I255 Ischemic cardiomyopathy: Secondary | ICD-10-CM

## 2020-06-27 LAB — CUP PACEART REMOTE DEVICE CHECK
Battery Remaining Longevity: 168 mo
Battery Remaining Percentage: 100 %
Brady Statistic RV Percent Paced: 0 %
Date Time Interrogation Session: 20220328002100
HighPow Impedance: 66 Ohm
Implantable Lead Implant Date: 20200629
Implantable Lead Location: 753860
Implantable Lead Model: 292
Implantable Lead Serial Number: 448526
Implantable Pulse Generator Implant Date: 20200629
Lead Channel Impedance Value: 532 Ohm
Lead Channel Pacing Threshold Amplitude: 1 V
Lead Channel Pacing Threshold Pulse Width: 0.4 ms
Lead Channel Setting Pacing Amplitude: 2 V
Lead Channel Setting Pacing Pulse Width: 0.4 ms
Lead Channel Setting Sensing Sensitivity: 0.6 mV
Pulse Gen Serial Number: 262129

## 2020-07-06 NOTE — Progress Notes (Signed)
Remote ICD transmission.   

## 2020-07-13 ENCOUNTER — Other Ambulatory Visit: Payer: Self-pay | Admitting: Cardiology

## 2020-07-14 ENCOUNTER — Other Ambulatory Visit: Payer: Self-pay | Admitting: Cardiology

## 2020-07-14 DIAGNOSIS — I255 Ischemic cardiomyopathy: Secondary | ICD-10-CM

## 2020-08-31 ENCOUNTER — Ambulatory Visit: Payer: Medicare HMO | Admitting: General Practice

## 2020-09-20 NOTE — Progress Notes (Signed)
Cardiology Clinic Note   Patient Name: Casey [name] Date of Encounter: 09/22/2020  Primary Care Provider:  Patient, No Pcp Per (Inactive) Primary Cardiologist:  Olga Millers, MD  Patient Profile    Casey [name] is a 50 y.o. female who presents to the clinic today for follow-up evaluation of her mural thrombus, cardiomyopathy, and CAD. Past Medical History    Past Medical History:  Diagnosis Date   Anticoagulation adequate 10/14/2015   CAD (coronary artery disease)    Coronary atherosclerosis of native coronary artery, multivessel with plans for CABG to be followed   08/04/2015   Ischemic cardiomyopathy    UTI (urinary tract infection) 10/14/2015   Past Surgical History:  Procedure Laterality Date   CARDIAC CATHETERIZATION N/A 08/04/2015   Procedure: Coronary/Graft Angiography;  Surgeon: Peter M Swaziland, MD;  Location: [facility] INVASIVE CV LAB;  Service: Cardiovascular;  Laterality: N/A;   CARDIAC CATHETERIZATION N/A 10/10/2015   Procedure: Left Heart Cath and Coronary Angiography;  Surgeon: Lennette Bihari, MD;  Location: [facility] INVASIVE CV LAB;  Service: Cardiovascular;  Laterality: N/A;   CESAREAN SECTION     ICD IMPLANT N/A 09/29/2018   Procedure: ICD IMPLANT;  Surgeon: Marinus Maw, MD;  Location: [facility] INVASIVE CV LAB;  Service: Cardiovascular;  Laterality: N/A;    Allergies  No Known Allergies  History of Present Illness    Casey [name] is a PMH of mural thrombus, cardiomyopathy, CAD status post CABG, and NSTEMI.  She was last seen by Dr. Jens Som on 07/31/2019.  She was previously admitted for abdominal pain.  Her abdominal CT showed thrombus at the LV apex.  An echocardiogram 5/17 showed an ejection fraction of 35-40% with anterior and apical wall motion abnormalities, G2 DD, thrombus at the apex, mild mitral regurgitation, and mild left atrial enlargement.  She underwent cardiac catheterization 5/17 which showed a 90% proximal to mid LAD, 60% ramus and 50% RCA.  It  was felt that she had likely had previous infarct in LAD territory.  Medical therapy was recommended.  She was placed on apixaban at that time.  She was readmitted 7/17 and cardiac cath showed 70% ramus, 50% followed by 55% LAD and occluded RCA.  EF 30-35%.  Apical thrombus noted.  Cardiac MRI 7/17 showed EF of 35% and a large apical thrombus.  There was mild mitral regurgitation.  She was seen by Dr. Cornelius Moras 10/17 and medical management was recommended.  She does have problems with compliance.  Her last echocardiogram 5/20 showed an ejection fraction of 30-35%, moderate LVH.  She had ICD implanted 6/20.  When she was last seen by Dr. Jens Som she denied dyspnea on exertion, orthopnea, PND, lower extremity edema, palpitations, syncope and chest discomfort.   She was seen virtually 01/28/2020 in follow-up and stated she felt well.  She had been packing up her house because she was moving.  She continued to be physically active walking every day if not every other day for 30 minutes at a time.  She had been following a low-salt diet and reported compliance with her Eliquis.  We reviewed her cardiac catheterization from 2017 and her most recent echocardiogram.  I planned to have her follow-up in 6 months, continued physical activity, and gave her the salty 6 diet sheet.   She presents the clinic today for follow-up evaluation states she has noticed some increased fatigue and daytime somnolence.  She reports that she has not been sleeping as well at night and notices that  she is snoring.  We reviewed her sleep habits and diet.  She continues to stay physically active walking 30 minutes most days.  Her most recent labs were from May 2021.  I will repeat her CBC, BMP, fasting lipids and LFTs.  I will give her the sleeping hygiene instructions, order a sleep study, and have her follow-up in 3 to 4 months.  Today she denies chest pain, shortness of breath, lower extremity edema, fatigue, palpitations, melena, hematuria,  hemoptysis, diaphoresis, weakness, presyncope, syncope, orthopnea, and PND.  Home Medications    Prior to Admission medications   Medication Sig Start Date End Date Taking? Authorizing Provider  ENTRESTO 24-26 MG TAKE 1 TABLET TWICE DAILY 07/14/20   Lewayne Bunting, MD  apixaban (ELIQUIS) 5 MG TABS tablet Take 1 tablet (5 mg total) by mouth 2 (two) times daily. 02/29/20   Lewayne Bunting, MD  atorvastatin (LIPITOR) 80 MG tablet TAKE 1 TABLET BY MOUTH DAILY. 07/14/20   Lewayne Bunting, MD  metoprolol succinate (TOPROL-XL) 100 MG 24 hr tablet TAKE 1 TABLET EVERY DAY 12/04/19   Lewayne Bunting, MD  nitroGLYCERIN (NITROSTAT) 0.4 MG SL tablet Place 1 tablet (0.4 mg total) under the tongue every 5 (five) minutes as needed for chest pain. 04/22/18 07/31/19  Kroeger, Ovidio Kin., PA-C  pantoprazole (PROTONIX) 40 MG tablet TAKE 1 TABLET (40 MG TOTAL) BY MOUTH DAILY. 07/14/20   Lewayne Bunting, MD    Family History    Family History  Problem Relation Age of Onset   Diabetes Mellitus II Neg Hx    CAD Neg Hx    She indicated that her mother is deceased. She indicated that the status of her father is unknown and reported the following: Health unknown. She indicated that both of her sisters are alive. She indicated that all of her three brothers are alive. She indicated that her maternal grandmother is deceased. She indicated that her maternal grandfather is deceased. She indicated that her paternal grandmother is deceased. She indicated that her paternal grandfather is deceased. She indicated that the status of her neg hx is unknown.  Social History    Social History   Socioeconomic History   Marital status: Divorced    Spouse name: Not on file   Number of children: Not on file   Years of education: Not on file   Highest education level: Not on file  Occupational History   Not on file  Tobacco Use   Smoking status: Some Days    Packs/day: 0.50    Pack years: 0.00    Types: Cigarettes    Smokeless tobacco: Never  Vaping Use   Vaping Use: Never used  Substance and Sexual Activity   Alcohol use: Yes    Alcohol/week: 0.0 standard drinks    Comment: Two drinks a night.   Drug use: No    Comment: No history of cocaine use   Sexual activity: Not on file  Other Topics Concern   Not on file  Social History Narrative   Not on file   Social Determinants of Health   Financial Resource Strain: Not on file  Food Insecurity: Not on file  Transportation Needs: Not on file  Physical Activity: Not on file  Stress: Not on file  Social Connections: Not on file  Intimate Partner Violence: Not on file     Review of Systems    General:  No chills, fever, night sweats or weight changes.  Cardiovascular:  No chest  pain, dyspnea on exertion, edema, orthopnea, palpitations, paroxysmal nocturnal dyspnea. Dermatological: No rash, lesions/masses Respiratory: No cough, dyspnea Urologic: No hematuria, dysuria Abdominal:   No nausea, vomiting, diarrhea, bright red blood per rectum, melena, or hematemesis Neurologic:  No visual changes, wkns, changes in mental status. All other systems reviewed and are otherwise negative except as noted above.  Physical Exam    VS:  BP 128/80   Pulse 92   Ht 5\' 5"  (1.651 m)   Wt 159 lb 9.6 oz (72.4 kg)   SpO2 99%   BMI 26.56 kg/m  , BMI Body mass index is 26.56 kg/m. GEN: Well nourished, well developed, in no acute distress. HEENT: normal. Neck: Supple, no JVD, carotid bruits, or masses. Cardiac: RRR, no murmurs, rubs, or gallops. No clubbing, cyanosis, edema.  Radials/DP/PT 2+ and equal bilaterally.  Respiratory:  Respirations regular and unlabored, clear to auscultation bilaterally. GI: Soft, nontender, nondistended, BS + x 4. MS: no deformity or atrophy. Skin: warm and dry, no rash. Neuro:  Strength and sensation are intact. Psych: Normal affect.  Accessory Clinical Findings    Recent Labs: No results found for requested labs within  last 8760 hours.   Recent Lipid Panel    Component Value Date/Time   CHOL 144 08/13/2019 0917   TRIG 84 08/13/2019 0917   HDL 65 08/13/2019 0917   CHOLHDL 2.2 08/13/2019 0917   CHOLHDL 2.6 10/14/2015 0357   VLDL 13 10/14/2015 0357   LDLCALC 63 08/13/2019 0917    ECG personally reviewed by me today-normal sinus rhythm biatrial enlargement left ventricular hypertrophy anterior lateral infarct undetermined age 27 bpm- No acute changes  Echocardiogram 08/13/2018 IMPRESSIONS     1. The left ventricle has moderate-severely reduced systolic function,  with an ejection fraction of 30-35%. The cavity size was normal. Moderate  hypertrophy of the basal septum with otherwise mild concentric LVH.08/15/2018 Left  ventricular diastolic Doppler  parameters are consistent with impaired relaxation. Indeterminate filling  pressures.   2. Apical akinesis. Akinesis of the apical inferior, mid-apical  anteroseptum, mid-apical anterior myocarrdium. No evidence of residual  apical thrombus.   3. The right ventricle has normal systolic function. The cavity was  normal. There is no increase in right ventricular wall thickness.   4. Trivial pericardial effusion is present.   5. The aortic valve is tricuspid. Aortic valve regurgitation was not  assessed by color flow Doppler.  Assessment & Plan   1.  Snoring/fatigue/daytime somnolence-reports daytime fatigue and somnolence.  Reports snoring.  History of anemia. Order sleep study Heart healthy low-sodium diet Sleep hygiene instructions given Increase iron rich foods/iron cookware Order CBC  CAD-denies chest pain.  No recent episodes of chest discomfort. Remains physically active. Continue Lipitor, metoprolol, Entresto Heart healthy low-sodium diet-salty 6 given Increase physical activity as tolerated Repeat BMP, cbc  Ischemic cardiomyopathy-euvolemic today.  No increased activity intolerance or DOE.  Last echocardiogram showed EF of 30-35%.  She  underwent ICD placement 6/20 and follows with EP. Continue Entresto, metoprolol Heart healthy low-sodium diet-salty 6 given Maintain physical activity   Apical mural thrombus-compliant with apixaban.  Denies bleeding episodes.  Last echocardiogram showed thrombus had resolved. Continue apixaban   HLD-08/13/2019: Cholesterol, Total 144; HDL 65; LDL Chol Calc (NIH) 63; Triglycerides 84 Continue atorvastatin Heart healthy low-sodium high-fiber diet Continue to increase physical activity as tolerated Repeat fasting lipids and LFTs  Disposition: Follow-up with Dr. 08/15/2019  in 3-4 months.   Jens Som. Bellah Alia NP-C    09/22/2020, 10:53  AM [facility] [address] Office [phone] Fax [phone]  Notice: This dictation was prepared with Dragon dictation along with smaller phrase technology. Any transcriptional errors that result from this process are unintentional and may not be corrected upon review.  I spent 15 minutes examining this patient, reviewing medications, and using patient centered shared decision making involving her cardiac care.  Prior to her visit I spent greater than 20 minutes reviewing her past medical history,  medications, and prior cardiac tests.

## 2020-09-22 ENCOUNTER — Ambulatory Visit (INDEPENDENT_AMBULATORY_CARE_PROVIDER_SITE_OTHER): Payer: Medicare HMO | Admitting: General Practice

## 2020-09-22 ENCOUNTER — Encounter: Payer: Self-pay | Admitting: General Practice

## 2020-09-22 ENCOUNTER — Other Ambulatory Visit: Payer: Self-pay

## 2020-09-22 VITALS — BP 128/80 | HR 92 | Ht 65.0 in | Wt 159.6 lb

## 2020-09-22 DIAGNOSIS — I2510 Atherosclerotic heart disease of native coronary artery without angina pectoris: Secondary | ICD-10-CM | POA: Diagnosis not present

## 2020-09-22 DIAGNOSIS — E7800 Pure hypercholesterolemia, unspecified: Secondary | ICD-10-CM

## 2020-09-22 DIAGNOSIS — Z79899 Other long term (current) drug therapy: Secondary | ICD-10-CM

## 2020-09-22 DIAGNOSIS — I513 Intracardiac thrombosis, not elsewhere classified: Secondary | ICD-10-CM | POA: Diagnosis not present

## 2020-09-22 DIAGNOSIS — R400 Somnolence: Secondary | ICD-10-CM

## 2020-09-22 DIAGNOSIS — I255 Ischemic cardiomyopathy: Secondary | ICD-10-CM

## 2020-09-22 NOTE — Patient Instructions (Addendum)
Medication Instructions:  The current medical regimen is effective;  continue present plan and medications as directed. Please refer to the Current Medication list given to you today.  *If you need a refill on your cardiac medications before your next appointment, please call your pharmacy*  Lab Work:   Testing/Procedures:  NONE    NONE  Special Instructions INCREASE IRON RICH FOODS  Your physician has recommended that you have a sleep study. This test records several body functions during sleep, including: brain activity, eye movement, oxygen and carbon dioxide blood levels, heart rate and rhythm, breathing rate and rhythm, the flow of air through your mouth and nose, snoring, body muscle movements, and chest and belly movement.  PLEASE READ AND FOLLOW SLEEP TIPS-ATTACHED  Follow-Up: Your next appointment:  3-4 month(s) In Person with Olga Millers, MD OR IF UNAVAILABLE JESSE CLEAVER, FNP-C   At [facility], you and your health needs are our priority.  As part of our continuing mission to provide you with exceptional heart care, we have created designated Provider Care Teams.  These Care Teams include your primary Cardiologist (physician) and Advanced Practice Providers (APPs -  Physician Assistants and Nurse Practitioners) who all work together to provide you with the care you need, when you need it.  We recommend signing up for the patient portal called "MyChart".  Sign up information is provided on this After Visit Summary.  MyChart is used to connect with patients for Virtual Visits (Telemedicine).  Patients are able to view lab/test results, encounter notes, upcoming appointments, etc.  Non-urgent messages can be sent to your provider as well.   To learn more about what you can do with MyChart, go to [email or web address].      Quality Sleep Information, Adult Quality sleep is important for your mental and physical health. It also improves your quality of life. Quality sleep  means you: Are asleep for most of the time you are in bed. Fall asleep within 30 minutes. Wake up no more than once a night.  Are awake for no longer than 20 minutes if you do wake up during the night. Most adults need 7-8 hours of quality sleep each night. How can poor sleep affect me? If you do not get enough quality sleep, you may have: Mood swings. Daytime sleepiness. Confusion. Decreased reaction time. Sleep disorders, such as insomnia and sleep apnea. Difficulty with: Solving problems. Coping with stress. Paying attention. These issues may affect your performance and productivity at work, school, and at home. Lack of sleep may also put you at higher risk for accidents, suicide,and risky behaviors. If you do not get quality sleep you may also be at higher risk for several health problems, including: Infections. Type 2 diabetes. Heart disease. High blood pressure. Obesity. Worsening of long-term conditions, like arthritis, kidney disease, depression, Parkinson's disease, and epilepsy. What actions can I take to get more quality sleep?     Stick to a sleep schedule. Go to sleep and wake up at about the same time each day. Do not try to sleep less on weekdays and make up for lost sleep on weekends. This does not work. Try to get about 30 minutes of exercise on most days. Do not exercise 2-3 hours before going to bed. Limit naps during the day to 30 minutes or less. Do not use any products that contain nicotine or tobacco, such as cigarettes or e-cigarettes. If you need help quitting, ask your health care provider. Do not drink caffeinated beverages for  at least 8 hours before going to bed. Coffee, tea, and some sodas contain caffeine. Do not drink alcohol close to bedtime. Do not eat large meals close to bedtime. Do not take naps in the late afternoon. Try to get at least 30 minutes of sunlight every day. Morning sunlight is best. Make time to relax before bed. Reading,  listening to music, or taking a hot bath promotes quality sleep. Make your bedroom a place that promotes quality sleep. Keep your bedroom dark, quiet, and at a comfortable room temperature. Make sure your bed is comfortable. Take out sleep distractions like TV, a computer, smartphone, and bright lights. If you are lying awake in bed for longer than 20 minutes, get up and do a relaxing activity until you feel sleepy. Work with your health care provider to treat medical conditions that may affect sleeping, such as: Nasal obstruction. Snoring. Sleep apnea and other sleep disorders. Talk to your health care provider if you think any of your prescription medicines may cause you to have difficulty falling or staying asleep. If you have sleep problems, talk with a sleep consultant. If you think you have a sleep disorder, talk with your health care provider about getting evaluated by a specialist. Where to find more information National Sleep Foundation website: https://sleepfoundation.org National Heart, Lung, and Blood Institute (NHLBI): https://hall.info/.pdf Centers for Disease Control and Prevention (CDC): DetailSports.is Contact a health care provider if you: Have trouble getting to sleep or staying asleep. Often wake up very early in the morning and cannot get back to sleep. Have daytime sleepiness. Have daytime sleep attacks of suddenly falling asleep and sudden muscle weakness (narcolepsy). Have a tingling sensation in your legs with a strong urge to move your legs (restless legs syndrome). Stop breathing briefly during sleep (sleep apnea). Think you have a sleep disorder or are taking a medicine that is affecting your quality of sleep. Summary Most adults need 7-8 hours of quality sleep each night. Getting enough quality sleep is an important part of health and well-being. Make your bedroom a place that promotes quality sleep and avoid  things that may cause you to have poor sleep, such as alcohol, caffeine, smoking, and large meals. Talk to your health care provider if you have trouble falling asleep or staying asleep. This information is not intended to replace advice given to you by your health care provider. Make sure you discuss any questions you have with your healthcare provider. Document Revised: 06/26/2017 Document Reviewed: 06/26/2017 Elsevier Patient Education  2022 [employer].

## 2020-09-26 ENCOUNTER — Ambulatory Visit: Payer: Medicare HMO

## 2020-09-26 ENCOUNTER — Other Ambulatory Visit: Payer: Self-pay | Admitting: Cardiology

## 2020-09-26 DIAGNOSIS — I255 Ischemic cardiomyopathy: Secondary | ICD-10-CM

## 2020-09-26 LAB — CUP PACEART REMOTE DEVICE CHECK
Battery Remaining Longevity: 168 mo
Battery Remaining Percentage: 100 %
Brady Statistic RV Percent Paced: 0 %
Date Time Interrogation Session: 20220627002200
HighPow Impedance: 66 Ohm
Implantable Lead Implant Date: 20200629
Implantable Lead Location: 753860
Implantable Lead Model: 292
Implantable Lead Serial Number: 448526
Implantable Pulse Generator Implant Date: 20200629
Lead Channel Impedance Value: 573 Ohm
Lead Channel Pacing Threshold Amplitude: 0.8 V
Lead Channel Pacing Threshold Pulse Width: 0.4 ms
Lead Channel Setting Pacing Amplitude: 2 V
Lead Channel Setting Pacing Pulse Width: 0.4 ms
Lead Channel Setting Sensing Sensitivity: 0.6 mV
Pulse Gen Serial Number: 262129

## 2020-09-26 NOTE — Telephone Encounter (Signed)
48f, 72.4kg, scr 0.72 08/13/19, lovw/cleaver 09/22/20

## 2020-09-29 ENCOUNTER — Other Ambulatory Visit: Payer: Self-pay | Admitting: Cardiology

## 2020-09-29 DIAGNOSIS — I255 Ischemic cardiomyopathy: Secondary | ICD-10-CM

## 2020-09-29 DIAGNOSIS — I739 Peripheral vascular disease, unspecified: Secondary | ICD-10-CM

## 2020-09-29 NOTE — Telephone Encounter (Signed)
Rx(s) sent to pharmacy electronically.  

## 2020-10-12 NOTE — Progress Notes (Signed)
Remote ICD transmission.   

## 2020-11-17 ENCOUNTER — Other Ambulatory Visit: Payer: Self-pay | Admitting: General Practice

## 2020-11-17 NOTE — Telephone Encounter (Signed)
Prescription refill request for Eliquis received. Last office visit:cleaver 09/22/20 Scr:0.72 08/13/19(overdue) Age: 32f Weight:72.4kg Refill granted for a 1 month supply w/indication on the bottle to complete labs asap

## 2020-12-02 ENCOUNTER — Other Ambulatory Visit: Payer: Self-pay | Admitting: Cardiology

## 2020-12-02 DIAGNOSIS — I255 Ischemic cardiomyopathy: Secondary | ICD-10-CM

## 2020-12-20 NOTE — Progress Notes (Signed)
HPI: FU coronary artery disease. Patient previously admitted with abdominal pain. Abdominal CT showed thrombus at the LV apex. Echocardiogram May 2017 showed ejection fraction 35-40% with anterior and apical wall motion abnormality. Grade 2 diastolic dysfunction. Thrombus at the apex. Mild mitral regurgitation and mild left atrial enlargement. Cardiac catheterization May 2017 showed a 90% proximal to mid LAD, 60% ramus and 50% right coronary artery. It was felt that she had likely infarcted the LAD territory and medical therapy recommended unless she has recurrent symptoms. She was placed on apixaban. Readmitted July 2017 and cardiac cath showed a 70% ramus, 50% followed by 55% LAD and occluded right coronary artery. Ejection 30-35%.  Apical thrombus noted. Cardiac MRI July 2017 showed ejection fraction 35%. There was a large apical thrombus. There was mild mitral regurgitation.  Patient seen by Dr. Cornelius Moras October 2017. Medical therapy recommended. There has been problems with compliance.  Last echocardiogram May 2020 showed ejection fraction 30 to 35%, moderate left ventricular hypertrophy.  Had ICD implanted June 2020. Since last seen, patient has some fatigue but denies increased dyspnea on exertion, orthopnea, PND, pedal edema, chest pain or syncope.  Current Outpatient Medications  Medication Sig Dispense Refill   apixaban (ELIQUIS) 5 MG TABS tablet TAKE 1 TABLET TWICE DAILY (LABS NEEDED FOR FURTHER REFILLS) 60 tablet 0   atorvastatin (LIPITOR) 80 MG tablet TAKE 1 TABLET BY MOUTH DAILY. 90 tablet 3   ENTRESTO 24-26 MG TAKE 1 TABLET TWICE DAILY 180 tablet 1   metoprolol succinate (TOPROL-XL) 100 MG 24 hr tablet Take 1 tablet (100 mg total) by mouth daily. 90 tablet 3   pantoprazole (PROTONIX) 40 MG tablet TAKE 1 TABLET (40 MG TOTAL) BY MOUTH DAILY. 90 tablet 2   nitroGLYCERIN (NITROSTAT) 0.4 MG SL tablet Place 1 tablet (0.4 mg total) under the tongue every 5 (five) minutes as needed for chest  pain. 75 tablet 1   No current facility-administered medications for this visit.     Past Medical History:  Diagnosis Date   Anticoagulation adequate 10/14/2015   CAD (coronary artery disease)    Coronary atherosclerosis of native coronary artery, multivessel with plans for CABG to be followed   08/04/2015   Ischemic cardiomyopathy    UTI (urinary tract infection) 10/14/2015    Past Surgical History:  Procedure Laterality Date   CARDIAC CATHETERIZATION N/A 08/04/2015   Procedure: Coronary/Graft Angiography;  Surgeon: Peter M Swaziland, MD;  Location: [facility] INVASIVE CV LAB;  Service: Cardiovascular;  Laterality: N/A;   CARDIAC CATHETERIZATION N/A 10/10/2015   Procedure: Left Heart Cath and Coronary Angiography;  Surgeon: Lennette Bihari, MD;  Location: [facility] INVASIVE CV LAB;  Service: Cardiovascular;  Laterality: N/A;   CESAREAN SECTION     ICD IMPLANT N/A 09/29/2018   Procedure: ICD IMPLANT;  Surgeon: Marinus Maw, MD;  Location: [facility] INVASIVE CV LAB;  Service: Cardiovascular;  Laterality: N/A;    Social History   Socioeconomic History   Marital status: Divorced    Spouse name: Not on file   Number of children: Not on file   Years of education: Not on file   Highest education level: Not on file  Occupational History   Not on file  Tobacco Use   Smoking status: Some Days    Packs/day: 0.50    Types: Cigarettes   Smokeless tobacco: Never  Vaping Use   Vaping Use: Never used  Substance and Sexual Activity   Alcohol use: Yes    Alcohol/week:  0.0 standard drinks    Comment: Two drinks a night.   Drug use: No    Comment: No history of cocaine use   Sexual activity: Not on file  Other Topics Concern   Not on file  Social History Narrative   Not on file   Social Determinants of Health   Financial Resource Strain: Not on file  Food Insecurity: Not on file  Transportation Needs: Not on file  Physical Activity: Not on file  Stress: Not on file  Social Connections: Not on file   Intimate Partner Violence: Not on file    Family History  Problem Relation Age of Onset   Diabetes Mellitus II Neg Hx    CAD Neg Hx     ROS: no fevers or chills, productive cough, hemoptysis, dysphasia, odynophagia, melena, hematochezia, dysuria, hematuria, rash, seizure activity, orthopnea, PND, pedal edema, claudication. Remaining systems are negative.  Physical Exam: Well-developed well-nourished in no acute distress.  Skin is warm and dry.  HEENT is normal.  Neck is supple.  Chest is clear to auscultation with normal expansion.  Cardiovascular exam is regular rate and rhythm.  Abdominal exam nontender or distended. No masses palpated. Extremities show no edema. neuro grossly intact  ECG- personally reviewed  A/P  1 coronary artery disease-patient denies chest pain.  Continue statin.  She is not on aspirin given need for anticoagulation.  2 ischemic cardiomyopathy-continue Entresto and beta-blockade.  Check potassium and renal function.  3 left ventricular apical thrombus-we will continue apixaban.  Check hemoglobin and renal function.  4 hyperlipidemia-continue statin.  Check lipids and liver.  5 ICD-Per electrophysiology.  6 tobacco abuse-patient again counseled on discontinuing.  Olga Millers, MD

## 2020-12-22 ENCOUNTER — Ambulatory Visit (INDEPENDENT_AMBULATORY_CARE_PROVIDER_SITE_OTHER): Payer: Medicare HMO | Admitting: Cardiology

## 2020-12-22 ENCOUNTER — Other Ambulatory Visit: Payer: Self-pay

## 2020-12-22 ENCOUNTER — Encounter: Payer: Self-pay | Admitting: Cardiology

## 2020-12-22 VITALS — BP 110/70 | HR 96 | Ht 65.0 in | Wt 161.8 lb

## 2020-12-22 DIAGNOSIS — I2510 Atherosclerotic heart disease of native coronary artery without angina pectoris: Secondary | ICD-10-CM | POA: Diagnosis not present

## 2020-12-22 DIAGNOSIS — I513 Intracardiac thrombosis, not elsewhere classified: Secondary | ICD-10-CM

## 2020-12-22 DIAGNOSIS — I255 Ischemic cardiomyopathy: Secondary | ICD-10-CM | POA: Diagnosis not present

## 2020-12-22 DIAGNOSIS — Z79899 Other long term (current) drug therapy: Secondary | ICD-10-CM | POA: Diagnosis not present

## 2020-12-22 LAB — CBC
Hematocrit: 29.5 % — ABNORMAL LOW (ref 34.0–46.6)
Hemoglobin: 10.2 g/dL — ABNORMAL LOW (ref 11.1–15.9)
MCH: 29.6 pg (ref 26.6–33.0)
MCHC: 34.6 g/dL (ref 31.5–35.7)
MCV: 86 fL (ref 79–97)
Platelets: 297 10*3/uL (ref 150–450)
RBC: 3.45 x10E6/uL — ABNORMAL LOW (ref 3.77–5.28)
RDW: 18.4 % — ABNORMAL HIGH (ref 11.7–15.4)
WBC: 5.4 10*3/uL (ref 3.4–10.8)

## 2020-12-22 LAB — COMPREHENSIVE METABOLIC PANEL
ALT: 6 IU/L (ref 0–32)
AST: 12 IU/L (ref 0–40)
Albumin/Globulin Ratio: 1.3 (ref 1.2–2.2)
Albumin: 4.1 g/dL (ref 3.8–4.8)
Alkaline Phosphatase: 77 IU/L (ref 44–121)
BUN/Creatinine Ratio: 12 (ref 9–23)
BUN: 8 mg/dL (ref 6–24)
Bilirubin Total: 0.5 mg/dL (ref 0.0–1.2)
CO2: 22 mmol/L (ref 20–29)
Calcium: 9.5 mg/dL (ref 8.7–10.2)
Chloride: 99 mmol/L (ref 96–106)
Creatinine, Ser: 0.67 mg/dL (ref 0.57–1.00)
Globulin, Total: 3.1 g/dL (ref 1.5–4.5)
Glucose: 96 mg/dL (ref 65–99)
Potassium: 3.6 mmol/L (ref 3.5–5.2)
Sodium: 138 mmol/L (ref 134–144)
Total Protein: 7.2 g/dL (ref 6.0–8.5)
eGFR: 106 mL/min/{1.73_m2} (ref >59)

## 2020-12-22 LAB — LIPID PANEL
Chol/HDL Ratio: 3.5 ratio (ref 0.0–4.4)
Cholesterol, Total: 166 mg/dL (ref 100–199)
HDL: 48 mg/dL (ref >39)
LDL Chol Calc (NIH): 102 mg/dL — ABNORMAL HIGH (ref 0–99)
Triglycerides: 88 mg/dL (ref 0–149)
VLDL Cholesterol Cal: 16 mg/dL (ref 5–40)

## 2020-12-22 NOTE — Patient Instructions (Signed)

## 2020-12-26 ENCOUNTER — Telehealth: Payer: Self-pay | Admitting: *Deleted

## 2020-12-26 ENCOUNTER — Ambulatory Visit (INDEPENDENT_AMBULATORY_CARE_PROVIDER_SITE_OTHER): Payer: Medicare HMO

## 2020-12-26 DIAGNOSIS — I255 Ischemic cardiomyopathy: Secondary | ICD-10-CM | POA: Diagnosis not present

## 2020-12-26 DIAGNOSIS — E7800 Pure hypercholesterolemia, unspecified: Secondary | ICD-10-CM

## 2020-12-26 LAB — CUP PACEART REMOTE DEVICE CHECK
Battery Remaining Longevity: 168 mo
Battery Remaining Percentage: 100 %
Brady Statistic RV Percent Paced: 0 %
Date Time Interrogation Session: 20220926002100
HighPow Impedance: 60 Ohm
Implantable Lead Implant Date: 20200629
Implantable Lead Location: 753860
Implantable Lead Model: 292
Implantable Lead Serial Number: 448526
Implantable Pulse Generator Implant Date: 20200629
Lead Channel Impedance Value: 533 Ohm
Lead Channel Pacing Threshold Amplitude: 0.9 V
Lead Channel Pacing Threshold Pulse Width: 0.4 ms
Lead Channel Setting Pacing Amplitude: 2 V
Lead Channel Setting Pacing Pulse Width: 0.4 ms
Lead Channel Setting Sensing Sensitivity: 0.6 mV
Pulse Gen Serial Number: 262129

## 2020-12-26 MED ORDER — EZETIMIBE 10 MG PO TABS: 10.0000 mg | ORAL_TABLET | Freq: Every day | ORAL | 3 refills | Status: DC

## 2020-12-26 NOTE — Telephone Encounter (Signed)
-----   Message from Lewayne Bunting, MD sent at 12/22/2020  4:40 PM EDT ----- Make sure she is taking lipitor 80 mg daily; Add zetia 10 mg daily; lipids and liver 12 weeks Olga Millers

## 2020-12-26 NOTE — Telephone Encounter (Signed)
pt aware of results  She reports taking atorvastatin New script sent to the pharmacy  Lab orders mailed to the pt

## 2020-12-30 NOTE — Progress Notes (Signed)
Remote ICD transmission.   

## 2021-01-25 ENCOUNTER — Other Ambulatory Visit: Payer: Self-pay

## 2021-01-25 NOTE — Telephone Encounter (Signed)
Not needed

## 2021-02-15 ENCOUNTER — Other Ambulatory Visit: Payer: Self-pay | Admitting: Cardiology

## 2021-03-14 ENCOUNTER — Telehealth: Payer: Self-pay

## 2021-03-14 NOTE — Telephone Encounter (Signed)
Letter has been sent to patient instructing them to call us if they are still interested in completing their sleep study. If we have not received a response from the patient within 30 days of this notice, the order will be cancelled and they will need to discuss the need for a sleep study at their next office visit.  ° °

## 2021-03-16 ENCOUNTER — Encounter: Payer: Self-pay | Admitting: Cardiology

## 2021-03-28 ENCOUNTER — Ambulatory Visit (INDEPENDENT_AMBULATORY_CARE_PROVIDER_SITE_OTHER): Payer: Medicare HMO

## 2021-03-28 DIAGNOSIS — I255 Ischemic cardiomyopathy: Secondary | ICD-10-CM

## 2021-03-28 LAB — CUP PACEART REMOTE DEVICE CHECK
Battery Remaining Longevity: 162 mo
Battery Remaining Percentage: 100 %
Brady Statistic RV Percent Paced: 0 %
Date Time Interrogation Session: 20221226002200
HighPow Impedance: 67 Ohm
Implantable Lead Implant Date: 20200629
Implantable Lead Location: 753860
Implantable Lead Model: 292
Implantable Lead Serial Number: 448526
Implantable Pulse Generator Implant Date: 20200629
Lead Channel Impedance Value: 513 Ohm
Lead Channel Pacing Threshold Amplitude: 1 V
Lead Channel Pacing Threshold Pulse Width: 0.4 ms
Lead Channel Setting Pacing Amplitude: 2 V
Lead Channel Setting Pacing Pulse Width: 0.4 ms
Lead Channel Setting Sensing Sensitivity: 0.6 mV
Pulse Gen Serial Number: 262129

## 2021-04-05 NOTE — Progress Notes (Signed)
Remote ICD transmission.   

## 2021-05-02 ENCOUNTER — Encounter: Payer: Self-pay | Admitting: *Deleted

## 2021-05-09 ENCOUNTER — Telehealth: Payer: Self-pay | Admitting: Cardiology

## 2021-05-09 ENCOUNTER — Other Ambulatory Visit: Payer: Self-pay | Admitting: General Practice

## 2021-05-09 NOTE — Telephone Encounter (Signed)
Appropriate dose and refill sent to requested pharmacy.  

## 2021-05-09 NOTE — Telephone Encounter (Signed)
Routed to pharmacy team for samples  If able, please pull for patient as there is 1 onsite triage nurse 05/09/21  Thanks

## 2021-05-09 NOTE — Telephone Encounter (Signed)
Prescription refill request for Eliquis received. 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  Check hemoglobin and renal function.

## 2021-05-09 NOTE — Telephone Encounter (Signed)
Pt c/o medication issue:  1. Name of Medication: apixaban (ELIQUIS) 5 MG TABS tablet  2. How are you currently taking this medication (dosage and times per day)? TAKE 1 TABLET TWICE DAILY (LABS NEEDED FOR FURTHER REFILLS)  3. Are you having a reaction (difficulty breathing--STAT)? no  4. What is your medication issue? Patient is out of medication wants to know if she can come to the office to get some samples until her refill get filled

## 2021-05-09 NOTE — Telephone Encounter (Signed)
Prescription refill request for Eliquis received. Indication: Apical Thrombus  Last office visit: 12/22/20 Stanford Breed)  Scr: 0.67 (12/22/20)  Age: 51 Weight: 73.4kg   Appropriate dose and refill sent to requested pharmacy.

## 2021-05-09 NOTE — Telephone Encounter (Signed)
*  STAT* If patient is at the pharmacy, call can be transferred to refill team.   1. Which medications need to be refilled? (please list name of each medication and dose if known) apixaban (ELIQUIS) 5 MG TABS tablet   2. Which pharmacy/location (including street and city if local pharmacy) is medication to be sent to? [facility] - [place], [place] - [address]   3. Do they need a 30 day or 90 day supply? 90  

## 2021-05-10 ENCOUNTER — Telehealth: Payer: Self-pay | Admitting: Cardiology

## 2021-05-10 MED ORDER — APIXABAN 5 MG PO TABS: ORAL_TABLET | ORAL | 0 refills | Status: DC

## 2021-05-10 NOTE — Telephone Encounter (Signed)
Prescription sample request for Eliquis received and placed at the nl front desk  Last office visit:crenshaw 12/22/20 Scr: 0.67 12/22/20 Age: 55f Weight:73.4kg

## 2021-05-10 NOTE — Telephone Encounter (Signed)
Patient calling the office for samples of medication:   1.  What medication and dosage are you requesting samples for? apixaban (ELIQUIS) 5 MG TABS tablet  2.  Are you currently out of this medication? Yes    

## 2021-05-18 ENCOUNTER — Other Ambulatory Visit: Payer: Self-pay | Admitting: Cardiology

## 2021-06-02 LAB — LIPID PANEL
Chol/HDL Ratio: 3 ratio (ref 0.0–4.4)
Cholesterol, Total: 134 mg/dL (ref 100–199)
HDL: 45 mg/dL (ref >39)
LDL Chol Calc (NIH): 72 mg/dL (ref 0–99)
Triglycerides: 88 mg/dL (ref 0–149)
VLDL Cholesterol Cal: 17 mg/dL (ref 5–40)

## 2021-06-02 LAB — HEPATIC FUNCTION PANEL
ALT: 10 IU/L (ref 0–32)
AST: 15 IU/L (ref 0–40)
Albumin: 4.2 g/dL (ref 3.8–4.8)
Alkaline Phosphatase: 57 IU/L (ref 44–121)
Bilirubin Total: 0.3 mg/dL (ref 0.0–1.2)
Bilirubin, Direct: <0.1 mg/dL (ref 0.00–0.40)
Total Protein: 7.3 g/dL (ref 6.0–8.5)

## 2021-06-06 ENCOUNTER — Other Ambulatory Visit: Payer: Self-pay

## 2021-06-06 DIAGNOSIS — E7800 Pure hypercholesterolemia, unspecified: Secondary | ICD-10-CM

## 2021-06-22 ENCOUNTER — Ambulatory Visit: Payer: Medicare HMO | Admitting: Cardiology

## 2021-06-26 ENCOUNTER — Ambulatory Visit (INDEPENDENT_AMBULATORY_CARE_PROVIDER_SITE_OTHER): Payer: Medicare HMO

## 2021-06-26 DIAGNOSIS — I255 Ischemic cardiomyopathy: Secondary | ICD-10-CM

## 2021-06-27 LAB — CUP PACEART REMOTE DEVICE CHECK
Battery Remaining Longevity: 162 mo
Battery Remaining Percentage: 100 %
Brady Statistic RV Percent Paced: 0 %
Date Time Interrogation Session: 20230327004300
HighPow Impedance: 58 Ohm
Implantable Lead Implant Date: 20200629
Implantable Lead Location: 753860
Implantable Lead Model: 292
Implantable Lead Serial Number: 448526
Implantable Pulse Generator Implant Date: 20200629
Lead Channel Impedance Value: 514 Ohm
Lead Channel Pacing Threshold Amplitude: 1 V
Lead Channel Pacing Threshold Pulse Width: 0.4 ms
Lead Channel Setting Pacing Amplitude: 2 V
Lead Channel Setting Pacing Pulse Width: 0.4 ms
Lead Channel Setting Sensing Sensitivity: 0.6 mV
Pulse Gen Serial Number: 262129

## 2021-06-29 ENCOUNTER — Telehealth: Payer: Self-pay

## 2021-06-29 ENCOUNTER — Ambulatory Visit (INDEPENDENT_AMBULATORY_CARE_PROVIDER_SITE_OTHER): Payer: Medicare HMO | Admitting: Pharmacist Clinician (PhC)/ Clinical Pharmacy Specialist

## 2021-06-29 DIAGNOSIS — E785 Hyperlipidemia, unspecified: Secondary | ICD-10-CM

## 2021-06-29 DIAGNOSIS — R7989 Other specified abnormal findings of blood chemistry: Secondary | ICD-10-CM

## 2021-06-29 DIAGNOSIS — I2510 Atherosclerotic heart disease of native coronary artery without angina pectoris: Secondary | ICD-10-CM

## 2021-06-29 MED ORDER — NEXLIZET 180-10 MG PO TABS: 180.0000 mg | ORAL_TABLET | Freq: Every day | ORAL | 3 refills | Status: DC

## 2021-06-29 NOTE — Telephone Encounter (Signed)
Pa for nexlizet sent: ?Bea Laura ([facility]) - [id number] ?Nexlizet 180-10MG  tablets ?Status: PA Request ?Created: March 30th, 2023 ?Sent: March 30th, 2023 ? ?Bmet and uric acid ordered and relesaed for post 2-3 weeks ? ?Lipid/hepatic panel ordered and released for post 3 months ? ?Will call pt to notify them once pa determination is made.  ?

## 2021-06-29 NOTE — Progress Notes (Signed)
06/29/2021 ?[name] ?03-13-1971 ?[id number] ? ? ?HPI:  Casey [name] is a 51 y.o. female patient of Dr Jens Som, who presents today for a lipid clinic evaluation.  See pertinent past medical history below.  Recent cholesterol labs showed LDL not at goal despite atorvastatin and ezetimibe.  States no issues with compliance, costs, or swallowing the large tablets.   ? 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 ? ?Current Medications: atorvastatin 80 (started 2/23), ezetimibe 10 ? ?Cholesterol Goals:LDL < 70 ?  ?Intolerant/previously tried: nkda ? ?Family history:  unsure on parents, siblings all healthy 3 living; children 33,32,27,26 - healthy ? ?Diet: mostly home cooked; proteins mostly chicken and fish, does fry foods about twice weekly; vegetables mostly canned; quit snacking recently ? ?Exercise:  no regular exercise ? ?Labs: 06/01/21:  TC 134, TG 88, HDL 45, LDL 72 ? ? ?Current Outpatient Medications  ?Medication Sig Dispense Refill  ? apixaban (ELIQUIS) 5 MG TABS tablet TAKE 1 TABLET TWICE DAILY (LABS NEEDED FOR FURTHER REFILLS) 28 tablet 0  ? atorvastatin (LIPITOR) 80 MG tablet TAKE 1 TABLET EVERY DAY 90 tablet 3  ? ENTRESTO 24-26 MG TAKE 1 TABLET TWICE DAILY 180 tablet 1  ? ezetimibe (ZETIA) 10 MG tablet Take 1 tablet (10 mg total) by mouth daily. 90 tablet 3  ? metoprolol succinate (TOPROL-XL) 100 MG 24 hr tablet Take 1 tablet (100 mg total) by mouth daily. 90 tablet 3  ? nitroGLYCERIN (NITROSTAT) 0.4 MG SL tablet Place 1 tablet (0.4 mg total) under the tongue every 5 (five) minutes as needed for chest pain. 75 tablet 1  ? pantoprazole (PROTONIX) 40 MG tablet TAKE 1 TABLET (40 MG TOTAL) BY MOUTH DAILY. 90 tablet 2  ? ?No current facility-administered medications for this visit.  ? ? ?No Known  Allergies ? ?Past Medical History:  ?Diagnosis Date  ? Anticoagulation adequate 10/14/2015  ? CAD (coronary artery disease)   ? Coronary atherosclerosis of native coronary artery, multivessel with plans for CABG to be followed   08/04/2015  ? Ischemic cardiomyopathy   ? UTI (urinary tract infection) 10/14/2015  ? ? ?Blood pressure 118/76, pulse 82, resp. rate 15, height 5\' 5"  (1.651 m), weight 160 lb (72.6 kg), last menstrual period 09/29/2018, SpO2 100 %. ? ? ?Hyperlipidemia ?Patient with hyperlipidemia, not at LDL goal despite high intensity statin and ezetimibe.  Reviewed options for lowering LDL cholesterol, including PCSK-9 inhibitors and bempedoic acid.  Discussed mechanisms of action, dosing, side effects and potential decreases in LDL cholesterol.  Also reviewed cost information and potential options for patient assistance.  Answered all patient questions.  Based on this information, patient would prefer to start  Bempedoic acid.  Because she is on ezetimibe, will get PA for Nexlizet, to decrease pill burden.  Once approved she will take 1 tablet daily and repeat lipid labs in 3 months.  ? ? ?10/01/2018 PharmD CPP [facility] ?[facility] ?[address] ?[place], [place] ?[phone] ? ? ? ?

## 2021-06-29 NOTE — Assessment & Plan Note (Signed)
Patient with hyperlipidemia, not at LDL goal despite high intensity statin and ezetimibe.  Reviewed options for lowering LDL cholesterol, including PCSK-9 inhibitors and bempedoic acid.  Discussed mechanisms of action, dosing, side effects and potential decreases in LDL cholesterol.  Also reviewed cost information and potential options for patient assistance.  Answered all patient questions.  Based on this information, patient would prefer to start  Bempedoic acid.  Because she is on ezetimibe, will get PA for Nexlizet, to decrease pill burden.  Once approved she will take 1 tablet daily and repeat lipid labs in 3 months.  ? ?

## 2021-06-29 NOTE — Telephone Encounter (Signed)
Called and spoke w/pt and stated that they were approved for nexlizet and rx sent. I advised pt once they receive their medication that they will stop taking the zetia stand alone pill as it will be combined in with the nexletol in a tablet called nexlizet. Pt voiced understating to get labs post 2-3 weeks then again in 3 months.  ?

## 2021-06-29 NOTE — Telephone Encounter (Signed)
-----   Message from [place], [place] sent at 06/29/2021  1:55 PM EDT ----- ?Regarding: RE: Nexlizet ?Bmet/uric acid in 2-3 weeks please apologize for not mentioning. Then lipid and hepatic in 3 months  via kristin verbal instruction ?----- Message ----- ?From: Rosalee Kaufman, RPH-CPP ?Sent: 06/29/2021   1:51 PM EDT ?To: Eather Colas, CMA ?Subject: Nexlizet                                      ? ?Could you please do the PA for Nexlizet, 1 tab daily, labs in 3 months ? ?Thank you! ? ? ?

## 2021-06-29 NOTE — Patient Instructions (Signed)
Your Results: ?           ? Your most recent labs Goal  ?Total Cholesterol 134 < 200  ?Triglycerides 88 < 150  ?HDL (happy/good cholesterol) 45 > 40  ?LDL (lousy/bad cholesterol 72 < 70   ? ?Medication changes: ? We will start the prior authorization to get Nexlizet covered by your insurance.  Once you start this you will need to stop taking the ezetimibe 10 mg tablets.  Continue with atorvastatin.   ? ?Lab orders: ? Repeat cholesterol labs in 3 months.   ? ? ?Thank you for choosing [facility]  ? ?

## 2021-07-05 NOTE — Progress Notes (Signed)
Remote ICD transmission.   

## 2021-07-09 ENCOUNTER — Emergency Department (HOSPITAL_COMMUNITY)
Admission: EM | Admit: 2021-07-09 | Discharge: 2021-07-09 | Disposition: A | Discharge status: Home / Self Care | Payer: Medicare HMO | Attending: Emergency Medicine | Admitting: Emergency Medicine

## 2021-07-09 ENCOUNTER — Encounter (HOSPITAL_COMMUNITY): Payer: Self-pay | Admitting: Emergency Medicine

## 2021-07-09 ENCOUNTER — Other Ambulatory Visit: Payer: Self-pay

## 2021-07-09 ENCOUNTER — Emergency Department (HOSPITAL_COMMUNITY): Payer: Medicare HMO

## 2021-07-09 DIAGNOSIS — R030 Elevated blood-pressure reading, without diagnosis of hypertension: Secondary | ICD-10-CM

## 2021-07-09 DIAGNOSIS — R519 Headache, unspecified: Secondary | ICD-10-CM | POA: Diagnosis present

## 2021-07-09 DIAGNOSIS — Z79899 Other long term (current) drug therapy: Secondary | ICD-10-CM | POA: Insufficient documentation

## 2021-07-09 DIAGNOSIS — Z7901 Long term (current) use of anticoagulants: Secondary | ICD-10-CM

## 2021-07-09 DIAGNOSIS — I2510 Atherosclerotic heart disease of native coronary artery without angina pectoris: Secondary | ICD-10-CM | POA: Insufficient documentation

## 2021-07-09 LAB — I-STAT BETA HCG BLOOD, ED (MC, WL, AP ONLY): I-stat hCG, quantitative: <5 m[IU]/mL (ref <5)

## 2021-07-09 MED ORDER — PROCHLORPERAZINE EDISYLATE 10 MG/2ML IJ SOLN
10.0000 mg | Freq: Once | INTRAMUSCULAR | Status: AC
  Administered 2021-07-09: 10 mg via INTRAVENOUS
  Filled 2021-07-09: qty 2

## 2021-07-09 MED ORDER — DIPHENHYDRAMINE HCL 50 MG/ML IJ SOLN
25.0000 mg | Freq: Once | INTRAMUSCULAR | Status: AC
  Administered 2021-07-09: 25 mg via INTRAVENOUS
  Filled 2021-07-09: qty 1

## 2021-07-09 MED ORDER — SODIUM CHLORIDE 0.9 % IV BOLUS
1000.0000 mL | Freq: Once | INTRAVENOUS | Status: AC
  Administered 2021-07-09: 1000 mL via INTRAVENOUS

## 2021-07-09 NOTE — Discharge Instructions (Addendum)
Your blood pressure was a little high today.  I think that was a response to you being in pain.  Please have your blood pressure checked several times over the next 1-2 weeks.  If your blood pressure comes back down to normal, you do not need to do anything special for it.  If your blood pressure continues to be high, you may need to be on medication to control it.  Blood pressure that is not adequately controlled can lead to heart attacks, strokes, kidney failure. ?

## 2021-07-09 NOTE — ED Triage Notes (Signed)
Pt BIB EMS from home with c/o headache x 2 hours. Pt hypertensive with EMS.  ?

## 2021-07-09 NOTE — ED Provider Notes (Signed)
?[facility] ?Provider Note ? ? ?CSN: [id number] ?Arrival date & time: 07/09/21  0117 ? ?  ? ?History ? ?Chief Complaint  ?Patient presents with  ? Headache  ? ? ?Casey [name] is a 51 y.o. female. ? ? ?Headache ?She has history of hyperlipidemia, coronary artery disease with ischemic cardiomyopathy, mural thrombus anticoagulated on apixaban and comes in because of sudden onset of a bitemporal headache radiating to the occiput.  She did scribes it as something banging inside her head.  She denies photophobia or phonophobia.  She denies any visual disturbance.  She denies nausea or vomiting.  Nothing makes the headache better, nothing makes it worse.  She has not had similar headaches in the past.  She has not taken anything for it. ?  ?Home Medications ?Prior to Admission medications   ?Medication Sig Start Date End Date Taking? Authorizing Provider  ?apixaban (ELIQUIS) 5 MG TABS tablet TAKE 1 TABLET TWICE DAILY (LABS NEEDED FOR FURTHER REFILLS) 05/10/21   Lelon Perla, MD  ?atorvastatin (LIPITOR) 80 MG tablet TAKE 1 TABLET EVERY DAY 05/18/21   Lelon Perla, MD  ?Bempedoic Acid-Ezetimibe (NEXLIZET) 180-10 MG TABS Take 180 mg by mouth daily. 06/29/21   Lelon Perla, MD  ?ENTRESTO 24-26 MG TAKE 1 TABLET TWICE DAILY 12/06/20   Lelon Perla, MD  ?metoprolol succinate (TOPROL-XL) 100 MG 24 hr tablet Take 1 tablet (100 mg total) by mouth daily. 09/29/20   Lelon Perla, MD  ?nitroGLYCERIN (NITROSTAT) 0.4 MG SL tablet Place 1 tablet (0.4 mg total) under the tongue every 5 (five) minutes as needed for chest pain. 04/22/18 09/14/22  Kroeger, Lorelee Cover., PA-C  ?pantoprazole (PROTONIX) 40 MG tablet TAKE 1 TABLET (40 MG TOTAL) BY MOUTH DAILY. 02/15/21   Lelon Perla, MD  ?   ? ?Allergies    ?Patient has no known allergies.   ? ?Review of Systems   ?Review of Systems  ?Neurological:  Positive for headaches.  ?All other systems reviewed and are negative. ? ?Physical  Exam ?Updated Vital Signs ?BP (!) 156/100   Pulse 100   Temp 98.4 ?F (36.9 ?C)   Resp 18   Ht 5\' 5"  (1.651 m)   Wt 72.6 kg   LMP 09/29/2018 (Exact Date)   SpO2 99%   BMI 26.43 kg/m?  ?51 year old female, resting comfortably and in no acute distress. Vital signs are significant for elevated blood pressure. Oxygen saturation is 99%, which is normal. ?Head is normocephalic and atraumatic. PERRLA, EOMI. Oropharynx is clear.  Fundi show no hemorrhage or exudate.  There is no tenderness to palpation at the insertion of the paracervical muscles, but there is tenderness to palpation of the temporalis muscle bilaterally. ?Neck is nontender and supple without adenopathy or JVD. ?Back is nontender and there is no CVA tenderness. ?Lungs are clear without rales, wheezes, or rhonchi. ?Chest is nontender. ?Heart has regular rate and rhythm without murmur. ?Abdomen is soft, flat, nontender without masses or hepatosplenomegaly and peristalsis is normoactive. ?Extremities have no cyanosis or edema, full range of motion is present. ?Skin is warm and dry without rash. ?Neurologic: Mental status is normal, cranial nerves are intact, strength is 5/5 in all 4 extremities. ? ?ED Results / Procedures / Treatments   ?Labs ?(all labs ordered are listed, but only abnormal results are displayed) ?Labs Reviewed  ?I-STAT BETA HCG BLOOD, ED ([facility], [facility], [facility] ONLY)  ? ? ?Radiology ?CT Head Wo Contrast ? ?Result  Date: 07/09/2021 ?CLINICAL DATA:  Sudden onset of headache, initial encounter EXAM: CT HEAD WITHOUT CONTRAST TECHNIQUE: Contiguous axial images were obtained from the base of the skull through the vertex without intravenous contrast. RADIATION DOSE REDUCTION: This exam was performed according to the departmental dose-optimization program which includes automated exposure control, adjustment of the mA and/or kV according to patient size and/or use of iterative reconstruction technique. COMPARISON:  None. FINDINGS: Brain: No evidence of acute  infarction, hemorrhage, hydrocephalus, extra-axial collection or mass lesion/mass effect. Mild atrophic changes are noted. Vascular: No hyperdense vessel or unexpected calcification. Skull: Normal. Negative for fracture or focal lesion. Sinuses/Orbits: No acute finding. Other: None. IMPRESSION: Mild atrophic changes without acute intracranial abnormality. Electronically Signed   By: Inez Catalina M.D.   On: 07/09/2021 03:15   ? ?Procedures ?Procedures  ? ? ?Medications Ordered in ED ?Medications  ?sodium chloride 0.9 % bolus 1,000 mL (has no administration in time range)  ?prochlorperazine (COMPAZINE) injection 10 mg (has no administration in time range)  ? ? ?ED Course/ Medical Decision Making/ A&P ?  ?                        ?Medical Decision Making ?Amount and/or Complexity of Data Reviewed ?Radiology: ordered. ? ?Risk ?Prescription drug management. ? ? ?Headache and patient who is anticoagulated.  No red flags on exam, but I am concerned about the sudden onset of headache.  She will be sent for CT of head, will also give her a headache cocktail of normal saline, prochlorperazine, diphenhydramine.  Old records reviewed confirming anticoagulation for cardiac mural thrombus, no prior CNS imaging, no prior ED visits for headaches.  Differential diagnosis includes, but is not limited to, subarachnoid hemorrhage, intracerebral bleeding, migraines, muscle contraction headache, meningitis. ? ?CT scan showed no acute process.  I have independently viewed the images, and agree with the radiologist's interpretation.  She had excellent relief of her headache with above-noted treatment.  I feel this was a muscle contraction headache.  Blood pressure is noted to be elevated here, will recheck blood pressure prior to discharge, but I recommend outpatient blood pressure rechecks over the next 2 weeks. ? ?Final Clinical Impression(s) / ED Diagnoses ?Final diagnoses:  ?Bad headache  ?Chronic anticoagulation  ?Elevated blood  pressure reading without diagnosis of hypertension  ? ? ?Rx / DC Orders ?ED Discharge Orders   ? ? None  ? ?  ? ? ?  ?Delora Fuel, MD ?[id number] ? ?

## 2021-07-14 ENCOUNTER — Telehealth: Payer: Self-pay | Admitting: Cardiology

## 2021-07-14 MED ORDER — EZETIMIBE 10 MG PO TABS: 10.0000 mg | ORAL_TABLET | Freq: Every day | ORAL | 3 refills | Status: DC

## 2021-07-14 NOTE — Telephone Encounter (Signed)
Patient stated since being on Nexlizet, she sees bugs flying around and thinks demons are in the house and pulling at her feet. She wants to know if she can go back on zetia. ?

## 2021-07-14 NOTE — Telephone Encounter (Signed)
Pt states that Dr. Stanford Breed changed her cholesterol medication and it's causing her to hallucinate. Pt doesn't know the name of the medication. She would like for a nurse to call her back. Please advise ?

## 2021-07-14 NOTE — Telephone Encounter (Signed)
Informed patient to stop taking Nexlizet and to go back on Zetia. She stated she has enough of the Zetia. ?

## 2021-07-14 NOTE — Telephone Encounter (Signed)
Yes, patient can d/c Nexlizet and restart Zetia. Will add bempedoic acid to intolerance list ?

## 2021-07-15 ENCOUNTER — Other Ambulatory Visit: Payer: Self-pay

## 2021-07-15 ENCOUNTER — Encounter (HOSPITAL_BASED_OUTPATIENT_CLINIC_OR_DEPARTMENT_OTHER): Payer: Self-pay | Admitting: *Deleted

## 2021-07-15 ENCOUNTER — Observation Stay (HOSPITAL_BASED_OUTPATIENT_CLINIC_OR_DEPARTMENT_OTHER)
Admission: EM | Admit: 2021-07-15 | Discharge: 2021-07-17 | Disposition: A | Discharge status: Home / Self Care | Payer: Medicare HMO | Attending: Internal Medicine | Admitting: Internal Medicine

## 2021-07-15 ENCOUNTER — Emergency Department (HOSPITAL_BASED_OUTPATIENT_CLINIC_OR_DEPARTMENT_OTHER): Payer: Medicare HMO

## 2021-07-15 DIAGNOSIS — I472 Ventricular tachycardia, unspecified: Secondary | ICD-10-CM | POA: Insufficient documentation

## 2021-07-15 DIAGNOSIS — R441 Visual hallucinations: Secondary | ICD-10-CM | POA: Diagnosis present

## 2021-07-15 DIAGNOSIS — Z7901 Long term (current) use of anticoagulants: Secondary | ICD-10-CM | POA: Diagnosis not present

## 2021-07-15 DIAGNOSIS — D696 Thrombocytopenia, unspecified: Secondary | ICD-10-CM | POA: Diagnosis not present

## 2021-07-15 DIAGNOSIS — Z87891 Personal history of nicotine dependence: Secondary | ICD-10-CM | POA: Diagnosis not present

## 2021-07-15 DIAGNOSIS — I2510 Atherosclerotic heart disease of native coronary artery without angina pectoris: Secondary | ICD-10-CM | POA: Diagnosis present

## 2021-07-15 DIAGNOSIS — E876 Hypokalemia: Secondary | ICD-10-CM

## 2021-07-15 DIAGNOSIS — I509 Heart failure, unspecified: Secondary | ICD-10-CM | POA: Diagnosis not present

## 2021-07-15 DIAGNOSIS — D509 Iron deficiency anemia, unspecified: Secondary | ICD-10-CM

## 2021-07-15 DIAGNOSIS — R443 Hallucinations, unspecified: Secondary | ICD-10-CM

## 2021-07-15 DIAGNOSIS — R519 Headache, unspecified: Secondary | ICD-10-CM | POA: Insufficient documentation

## 2021-07-15 DIAGNOSIS — Z79899 Other long term (current) drug therapy: Secondary | ICD-10-CM | POA: Insufficient documentation

## 2021-07-15 DIAGNOSIS — I513 Intracardiac thrombosis, not elsewhere classified: Secondary | ICD-10-CM | POA: Diagnosis present

## 2021-07-15 DIAGNOSIS — D649 Anemia, unspecified: Secondary | ICD-10-CM

## 2021-07-15 DIAGNOSIS — G9340 Encephalopathy, unspecified: Secondary | ICD-10-CM | POA: Diagnosis present

## 2021-07-15 DIAGNOSIS — I4729 Other ventricular tachycardia: Secondary | ICD-10-CM

## 2021-07-15 DIAGNOSIS — I255 Ischemic cardiomyopathy: Secondary | ICD-10-CM | POA: Diagnosis present

## 2021-07-15 LAB — CBC WITH DIFFERENTIAL/PLATELET
Abs Immature Granulocytes: 0.01 10*3/uL (ref 0.00–0.07)
Basophils Absolute: 0 10*3/uL (ref 0.0–0.1)
Basophils Relative: 1 %
Eosinophils Absolute: 0 10*3/uL (ref 0.0–0.5)
Eosinophils Relative: 1 %
HCT: 23.6 % — ABNORMAL LOW (ref 36.0–46.0)
Hemoglobin: 8.3 g/dL — ABNORMAL LOW (ref 12.0–15.0)
Immature Granulocytes: 0 %
Lymphocytes Relative: 45 %
Lymphs Abs: 2.6 10*3/uL (ref 0.7–4.0)
MCH: 27.8 pg (ref 26.0–34.0)
MCHC: 35.2 g/dL (ref 30.0–36.0)
MCV: 78.9 fL — ABNORMAL LOW (ref 80.0–100.0)
Monocytes Absolute: 0.4 10*3/uL (ref 0.1–1.0)
Monocytes Relative: 6 %
Neutro Abs: 2.8 10*3/uL (ref 1.7–7.7)
Neutrophils Relative %: 47 %
Platelets: 114 10*3/uL — ABNORMAL LOW (ref 150–400)
RBC: 2.99 MIL/uL — ABNORMAL LOW (ref 3.87–5.11)
RDW: 21.3 % — ABNORMAL HIGH (ref 11.5–15.5)
Smear Review: NORMAL
WBC: 5.8 10*3/uL (ref 4.0–10.5)
nRBC: 0 % (ref 0.0–0.2)

## 2021-07-15 LAB — COMPREHENSIVE METABOLIC PANEL
ALT: 34 U/L (ref 0–44)
AST: 73 U/L — ABNORMAL HIGH (ref 15–41)
Albumin: 4.1 g/dL (ref 3.5–5.0)
Alkaline Phosphatase: 51 U/L (ref 38–126)
Anion gap: 13 (ref 5–15)
BUN: 21 mg/dL — ABNORMAL HIGH (ref 6–20)
CO2: 23 mmol/L (ref 22–32)
Calcium: 9.7 mg/dL (ref 8.9–10.3)
Chloride: 98 mmol/L (ref 98–111)
Creatinine, Ser: 1.08 mg/dL — ABNORMAL HIGH (ref 0.44–1.00)
GFR, Estimated: >60 mL/min (ref >60)
Glucose, Bld: 106 mg/dL — ABNORMAL HIGH (ref 70–99)
Potassium: 2.9 mmol/L — ABNORMAL LOW (ref 3.5–5.1)
Sodium: 134 mmol/L — ABNORMAL LOW (ref 135–145)
Total Bilirubin: 0.4 mg/dL (ref 0.3–1.2)
Total Protein: 7.7 g/dL (ref 6.5–8.1)

## 2021-07-15 LAB — URINALYSIS, ROUTINE W REFLEX MICROSCOPIC
Bilirubin Urine: NEGATIVE
Glucose, UA: NEGATIVE mg/dL
Hgb urine dipstick: NEGATIVE
Ketones, ur: NEGATIVE mg/dL
Nitrite: NEGATIVE
Protein, ur: NEGATIVE mg/dL
Specific Gravity, Urine: 1.01 (ref 1.005–1.030)
pH: 5.5 (ref 5.0–8.0)

## 2021-07-15 LAB — RETICULOCYTES
Immature Retic Fract: 39 % — ABNORMAL HIGH (ref 2.3–15.9)
RBC.: 3.04 MIL/uL — ABNORMAL LOW (ref 3.87–5.11)
Retic Count, Absolute: 20.9 10*3/uL (ref 19.0–186.0)
Retic Ct Pct: 0.7 % (ref 0.4–3.1)

## 2021-07-15 LAB — ETHANOL: Alcohol, Ethyl (B): <10 mg/dL (ref <10)

## 2021-07-15 LAB — RAPID URINE DRUG SCREEN, HOSP PERFORMED
Amphetamines: NOT DETECTED
Barbiturates: NOT DETECTED
Benzodiazepines: NOT DETECTED
Cocaine: NOT DETECTED
Opiates: NOT DETECTED
Tetrahydrocannabinol: NOT DETECTED

## 2021-07-15 LAB — URINALYSIS, MICROSCOPIC (REFLEX)

## 2021-07-15 LAB — LACTATE DEHYDROGENASE: LDH: 290 U/L — ABNORMAL HIGH (ref 98–192)

## 2021-07-15 LAB — MAGNESIUM: Magnesium: 1.2 mg/dL — ABNORMAL LOW (ref 1.7–2.4)

## 2021-07-15 LAB — OCCULT BLOOD X 1 CARD TO LAB, STOOL: Fecal Occult Bld: NEGATIVE

## 2021-07-15 MED ORDER — MAGNESIUM OXIDE -MG SUPPLEMENT 400 (240 MG) MG PO TABS: 800.0000 mg | ORAL_TABLET | Freq: Once | ORAL | Status: DC

## 2021-07-15 MED ORDER — MAGNESIUM OXIDE -MG SUPPLEMENT 400 (240 MG) MG PO TABS
800.0000 mg | ORAL_TABLET | Freq: Once | ORAL | Status: AC
  Administered 2021-07-15: 800 mg via ORAL
  Filled 2021-07-15: qty 2

## 2021-07-15 MED ORDER — POTASSIUM CHLORIDE CRYS ER 20 MEQ PO TBCR
40.0000 meq | EXTENDED_RELEASE_TABLET | Freq: Once | ORAL | Status: AC
  Administered 2021-07-15: 40 meq via ORAL
  Filled 2021-07-15: qty 2

## 2021-07-15 NOTE — ED Notes (Signed)
Lab confirmed they will add on Mg level (no need to recollect light green top)   ?

## 2021-07-15 NOTE — ED Notes (Signed)
Patient given water---okayed by provided ?

## 2021-07-15 NOTE — ED Triage Notes (Addendum)
Headache on 07/09/21 and was seen at [facility] ED. States on Thursday started hallucinating, seeing a demon touching her, and same thing yesterday and today. No history of hallucinations in past. Stated lasted almost all day. At this time no hallucinations.  ? ?Patient started taking Nexlizet 180-10mg  2 weeks ago.  ? ?Patient stopped taking the medication on Friday and started back on Metoprolol and Ezetimibe.  ?

## 2021-07-15 NOTE — ED Notes (Signed)
Pt calm and cooperative with staff at this time - responding appropriately to questions now oob to hall bathroom to provide urine sample  ? ?

## 2021-07-15 NOTE — ED Provider Notes (Signed)
?[facility] EMERGENC[facility] DEPT ?Provider Note ? ? ?CSN: [id number] ?Arrival date & time: 07/15/21  1855 ? ?  ? ?History ? ?Chief Complaint  ?Patient presents with  ? Hallucinations  ? Headache  ? ? ?Casey [name] is a 51 y.o. female with pertinent history of CAD, ischemic cardiomyopathy, CHF, GERD.  Presents to the emergency department with a chief complaint of hallucinations.  Patient states that on Thursday she started having hallucinations.  Patient states that she saw a demon in her house when she was at home alone.  Patient describes edema in his "seeing a shadow."  Patient states that he was staying with her daughter over the last 2 days with resolution of her hallucination.  Patient denies any previous episodes of similar symptoms.  Patient states that she had lack of sleep after her visual hallucinations started but none prior to hallucinations. ? ?Patient states that she did start Nexlizet 2 weeks prior, this medication was discontinued and patient was started back on ezetimibe.  Patient denies any other medication changes recently.  Patient does endorse alcohol use.  States that she drinks approximately 2 alcoholic beverages per day.  Patient denies any illicit drug use.  Patient denies any recent falls or traumatic injuries. ? ?Patient denies any auditory hallucinations, suicidal ideations, or homicidal ideations.  ? ? ?Headache ?Associated symptoms: no abdominal pain, no back pain, no dizziness, no fever, no nausea, no neck pain and no vomiting   ? ?  ? ?Home Medications ?Prior to Admission medications   ?Medication Sig Start Date End Date Taking? Authorizing Provider  ?apixaban (ELIQUIS) 5 MG TABS tablet TAKE 1 TABLET TWICE DAILY (LABS NEEDED FOR FURTHER REFILLS) 05/10/21   Lewayne Bunting, MD  ?atorvastatin (LIPITOR) 80 MG tablet TAKE 1 TABLET EVERY DAY 05/18/21   Lewayne Bunting, MD  ?ENTRESTO 24-26 MG TAKE 1 TABLET TWICE DAILY 12/06/20   Lewayne Bunting, MD  ?ezetimibe (ZETIA) 10 MG  tablet Take 1 tablet (10 mg total) by mouth daily. 07/14/21   Cheree Ditto, RPH  ?metoprolol succinate (TOPROL-XL) 100 MG 24 hr tablet Take 1 tablet (100 mg total) by mouth daily. 09/29/20   Lewayne Bunting, MD  ?nitroGLYCERIN (NITROSTAT) 0.4 MG SL tablet Place 1 tablet (0.4 mg total) under the tongue every 5 (five) minutes as needed for chest pain. 04/22/18 09/14/22  Kroeger, Ovidio Kin., PA-C  ?pantoprazole (PROTONIX) 40 MG tablet TAKE 1 TABLET (40 MG TOTAL) BY MOUTH DAILY. 02/15/21   Lewayne Bunting, MD  ?   ? ?Allergies    ?Bempedoic acid   ? ?Review of Systems   ?Review of Systems  ?Constitutional:  Negative for chills and fever.  ?Eyes:  Negative for visual disturbance.  ?Respiratory:  Negative for shortness of breath.   ?Cardiovascular:  Negative for chest pain.  ?Gastrointestinal:  Negative for abdominal pain, nausea and vomiting.  ?Genitourinary:  Negative for difficulty urinating, dysuria, frequency, hematuria and urgency.  ?Musculoskeletal:  Negative for back pain and neck pain.  ?Skin:  Negative for color change and rash.  ?Neurological:  Negative for dizziness, syncope, light-headedness and headaches.  ?Psychiatric/Behavioral:  Positive for hallucinations and sleep disturbance. Negative for confusion, self-injury and suicidal ideas.   ? ?Physical Exam ?Updated Vital Signs ?BP 126/65   Pulse 94   Temp 99.2 ?F (37.3 ?C) (Oral)   Resp 18   LMP 09/29/2018 (Exact Date)   SpO2 100%  ?Physical Exam ?Vitals and nursing note reviewed. Exam conducted with a chaperone  present (Female nurse tech present as chaperone).  ?Constitutional:   ?   General: She is not in acute distress. ?   Appearance: She is not ill-appearing, toxic-appearing or diaphoretic.  ?HENT:  ?   Head: Normocephalic.  ?Eyes:  ?   General: No scleral icterus.    ?   Right eye: No discharge.     ?   Left eye: No discharge.  ?Cardiovascular:  ?   Rate and Rhythm: Normal rate.  ?   Pulses:     ?     Radial pulses are 2+ on the right side  and 2+ on the left side.  ?Pulmonary:  ?   Effort: Pulmonary effort is normal. No tachypnea, bradypnea or respiratory distress.  ?   Breath sounds: Normal breath sounds. No stridor.  ?Genitourinary: ?   Rectum: No mass, tenderness, anal fissure, external hemorrhoid or internal hemorrhoid. Normal anal tone.  ?   Comments: Minimal light brown stool noted in rectal vault.  No frank red blood or melena on exam ?Skin: ?   General: Skin is warm and dry.  ?   Findings: No petechiae or rash. Rash is not purpuric.  ?   Comments: No rash noted  ?Neurological:  ?   General: No focal deficit present.  ?   Mental Status: She is alert.  ?   GCS: GCS eye subscore is 4. GCS verbal subscore is 5. GCS motor subscore is 6.  ?Psychiatric:     ?   Attention and Perception: Attention normal. She is attentive. She does not perceive auditory or visual hallucinations.     ?   Mood and Affect: Mood normal.     ?   Speech: Speech normal.     ?   Behavior: Behavior is cooperative.     ?   Thought Content: Thought content is not paranoid or delusional. Thought content does not include homicidal or suicidal ideation. Thought content does not include homicidal or suicidal plan.  ? ? ?ED Results / Procedures / Treatments   ?Labs ?(all labs ordered are listed, but only abnormal results are displayed) ?Labs Reviewed  ?COMPREHENSIVE METABOLIC PANEL - Abnormal; Notable for the following components:  ?    Result Value  ? Sodium 134 (*)   ? Potassium 2.9 (*)   ? Glucose, Bld 106 (*)   ? BUN 21 (*)   ? Creatinine, Ser 1.08 (*)   ? AST 73 (*)   ? All other components within normal limits  ?CBC WITH DIFFERENTIAL/PLATELET - Abnormal; Notable for the following components:  ? RBC 2.99 (*)   ? Hemoglobin 8.3 (*)   ? HCT 23.6 (*)   ? MCV 78.9 (*)   ? RDW 21.3 (*)   ? Platelets 114 (*)   ? All other components within normal limits  ?URINALYSIS, ROUTINE W REFLEX MICROSCOPIC - Abnormal; Notable for the following components:  ? Color, Urine STRAW (*)   ?  Leukocytes,Ua SMALL (*)   ? All other components within normal limits  ?URINALYSIS, MICROSCOPIC (REFLEX) - Abnormal; Notable for the following components:  ? Bacteria, UA FEW (*)   ? All other components within normal limits  ?ETHANOL  ?RAPID URINE DRUG SCREEN, HOSP PERFORMED  ?MAGNESIUM  ?DIC (DISSEMINATED INTRAVASCULAR COAGULATION)PANEL  ?ADAMTS13 ACTIVITY  ?LACTATE DEHYDROGENASE  ?HAPTOGLOBIN  ?RETICULOCYTES  ?PATHOLOGIST SMEAR REVIEW  ?POC OCCULT BLOOD, ED  ? ? ?EKG ?None ? ?Radiology ?No results found. ? ?Procedures ?Procedures  ? ? ?Medications  Ordered in ED ?Medications  ?potassium chloride SA (KLOR-CON M) CR tablet 40 mEq (40 mEq Oral Given 07/15/21 2220)  ?magnesium oxide (MAG-OX) tablet 800 mg (800 mg Oral Given 07/15/21 2227)  ? ? ?ED Course/ Medical Decision Making/ A&P ?  ?                        ?Medical Decision Making ?Amount and/or Complexity of Data Reviewed ?Labs: ordered. ?Radiology: ordered. ? ?Risk ?OTC drugs. ?Prescription drug management. ? ? ?Alert 51 year old female in no acute distress, nontoxic-appearing.  Presents to the emergency department with a chief complaint of hallucinations. ? ?Information is obtained from patient, patient's daughter, and patient's sister.  Past medical records were reviewed including previous provider notes, labs, and imaging.  Patient has past medical history as outlined in HPI which complicates her care. ? ?Patient denies any hallucinations at present.  Patient is not acutely psychotic as she has normal attention, no evidence of thought blocking, normal mood, normal speech.  Denies any SI, HI, or auditory hallucinations.  Will obtain labs to look for possible medical cause.  Patient was recently started on a new medication however this medication does not have any known side effects of hallucinations. ? ?I personally viewed interpret patient's lab results.  Pertinent findings include: ?-Urinalysis shows no signs of infection ?-UDS unremarkable ?-Thrombocytopenia  with platelet count of 114 ?-Anemia with red blood cell count 2.99, hemoglobin 8.3, hematocrit 23.6.  Schistocytes visualized on differential ? ?With new thrombocytopenia, anemia, schistocytes on differential concern f

## 2021-07-15 NOTE — ED Notes (Signed)
Patient stated that she started taking a new cholesterol medication 2 weeks ago.  ?April 9th patient came down with a headache ?April 12th , April 13 patient started hallucinating in the evening   ?April discontinued old cholesterol medication  ?

## 2021-07-16 ENCOUNTER — Encounter (HOSPITAL_COMMUNITY): Payer: Self-pay | Admitting: Internal Medicine

## 2021-07-16 DIAGNOSIS — G9340 Encephalopathy, unspecified: Secondary | ICD-10-CM

## 2021-07-16 DIAGNOSIS — I509 Heart failure, unspecified: Secondary | ICD-10-CM | POA: Diagnosis not present

## 2021-07-16 DIAGNOSIS — Z7901 Long term (current) use of anticoagulants: Secondary | ICD-10-CM | POA: Diagnosis not present

## 2021-07-16 DIAGNOSIS — D696 Thrombocytopenia, unspecified: Secondary | ICD-10-CM

## 2021-07-16 DIAGNOSIS — E876 Hypokalemia: Secondary | ICD-10-CM

## 2021-07-16 DIAGNOSIS — D509 Iron deficiency anemia, unspecified: Secondary | ICD-10-CM

## 2021-07-16 DIAGNOSIS — I4729 Other ventricular tachycardia: Secondary | ICD-10-CM

## 2021-07-16 DIAGNOSIS — R519 Headache, unspecified: Secondary | ICD-10-CM | POA: Diagnosis not present

## 2021-07-16 DIAGNOSIS — Z87891 Personal history of nicotine dependence: Secondary | ICD-10-CM | POA: Diagnosis not present

## 2021-07-16 DIAGNOSIS — R441 Visual hallucinations: Secondary | ICD-10-CM | POA: Diagnosis present

## 2021-07-16 DIAGNOSIS — I472 Ventricular tachycardia, unspecified: Secondary | ICD-10-CM | POA: Diagnosis not present

## 2021-07-16 DIAGNOSIS — I2510 Atherosclerotic heart disease of native coronary artery without angina pectoris: Secondary | ICD-10-CM | POA: Diagnosis not present

## 2021-07-16 DIAGNOSIS — R443 Hallucinations, unspecified: Secondary | ICD-10-CM

## 2021-07-16 DIAGNOSIS — Z79899 Other long term (current) drug therapy: Secondary | ICD-10-CM | POA: Diagnosis not present

## 2021-07-16 LAB — BASIC METABOLIC PANEL WITH GFR
Anion gap: 10 (ref 5–15)
BUN: 18 mg/dL (ref 6–20)
CO2: 25 mmol/L (ref 22–32)
Calcium: 9.5 mg/dL (ref 8.9–10.3)
Chloride: 100 mmol/L (ref 98–111)
Creatinine, Ser: 0.9 mg/dL (ref 0.44–1.00)
GFR, Estimated: >60 mL/min
Glucose, Bld: 103 mg/dL — ABNORMAL HIGH (ref 70–99)
Potassium: 3.1 mmol/L — ABNORMAL LOW (ref 3.5–5.1)
Sodium: 135 mmol/L (ref 135–145)

## 2021-07-16 LAB — CBC WITH DIFFERENTIAL/PLATELET
Abs Immature Granulocytes: 0.01 10*3/uL (ref 0.00–0.07)
Basophils Absolute: 0 10*3/uL (ref 0.0–0.1)
Basophils Relative: 1 %
Eosinophils Absolute: 0 10*3/uL (ref 0.0–0.5)
Eosinophils Relative: 1 %
HCT: 25 % — ABNORMAL LOW (ref 36.0–46.0)
Hemoglobin: 8.5 g/dL — ABNORMAL LOW (ref 12.0–15.0)
Immature Granulocytes: 0 %
Lymphocytes Relative: 49 %
Lymphs Abs: 2.5 10*3/uL (ref 0.7–4.0)
MCH: 28.1 pg (ref 26.0–34.0)
MCHC: 34 g/dL (ref 30.0–36.0)
MCV: 82.8 fL (ref 80.0–100.0)
Monocytes Absolute: 0.3 10*3/uL (ref 0.1–1.0)
Monocytes Relative: 6 %
Neutro Abs: 2.1 10*3/uL (ref 1.7–7.7)
Neutrophils Relative %: 43 %
Platelets: 121 10*3/uL — ABNORMAL LOW (ref 150–400)
RBC: 3.02 MIL/uL — ABNORMAL LOW (ref 3.87–5.11)
RDW: 22 % — ABNORMAL HIGH (ref 11.5–15.5)
Smear Review: ADEQUATE
WBC: 4.9 10*3/uL (ref 4.0–10.5)
nRBC: 0 % (ref 0.0–0.2)

## 2021-07-16 LAB — DIC (DISSEMINATED INTRAVASCULAR COAGULATION)PANEL
D-Dimer, Quant: 0.38 ug{FEU}/mL (ref 0.00–0.50)
Fibrinogen: 344 mg/dL (ref 210–475)
INR: 1.2 (ref 0.8–1.2)
Platelets: 114 K/uL — ABNORMAL LOW (ref 150–400)
Prothrombin Time: 15.2 s (ref 11.4–15.2)
aPTT: 30 s (ref 24–36)

## 2021-07-16 LAB — AMMONIA: Ammonia: 19 umol/L (ref 9–35)

## 2021-07-16 LAB — IRON AND TIBC
Iron: 38 ug/dL (ref 28–170)
Saturation Ratios: 9 % — ABNORMAL LOW (ref 10.4–31.8)
TIBC: 402 ug/dL (ref 250–450)
UIBC: 364 ug/dL

## 2021-07-16 LAB — FERRITIN: Ferritin: 74 ng/mL (ref 11–307)

## 2021-07-16 LAB — TSH: TSH: 0.919 u[IU]/mL (ref 0.350–4.500)

## 2021-07-16 LAB — POTASSIUM: Potassium: 3.6 mmol/L (ref 3.5–5.1)

## 2021-07-16 LAB — VITAMIN B12: Vitamin B-12: 245 pg/mL (ref 180–914)

## 2021-07-16 LAB — MAGNESIUM: Magnesium: 1.4 mg/dL — ABNORMAL LOW (ref 1.7–2.4)

## 2021-07-16 MED ORDER — ATORVASTATIN CALCIUM 80 MG PO TABS
80.0000 mg | ORAL_TABLET | Freq: Every day | ORAL | Status: DC
  Administered 2021-07-17: 80 mg via ORAL
  Filled 2021-07-16: qty 1

## 2021-07-16 MED ORDER — MAGNESIUM SULFATE 2 GM/50ML IV SOLN
2.0000 g | Freq: Once | INTRAVENOUS | Status: AC
  Administered 2021-07-16: 2 g via INTRAVENOUS
  Filled 2021-07-16: qty 50

## 2021-07-16 MED ORDER — METOPROLOL SUCCINATE ER 100 MG PO TB24
100.0000 mg | ORAL_TABLET | Freq: Every day | ORAL | Status: DC
Start: 2021-07-17 — End: 2021-07-17
  Administered 2021-07-17: 100 mg via ORAL
  Filled 2021-07-16: qty 1

## 2021-07-16 MED ORDER — PANTOPRAZOLE SODIUM 40 MG PO TBEC
40.0000 mg | DELAYED_RELEASE_TABLET | Freq: Every day | ORAL | Status: DC
  Administered 2021-07-17: 40 mg via ORAL
  Filled 2021-07-16: qty 1

## 2021-07-16 MED ORDER — ACETAMINOPHEN 650 MG RE SUPP
650.0000 mg | Freq: Four times a day (QID) | RECTAL | Status: DC | PRN
Start: 2021-07-16 — End: 2021-07-17

## 2021-07-16 MED ORDER — POTASSIUM CHLORIDE CRYS ER 20 MEQ PO TBCR
40.0000 meq | EXTENDED_RELEASE_TABLET | Freq: Once | ORAL | Status: AC
Start: 2021-07-16 — End: 2021-07-16
  Administered 2021-07-16: 40 meq via ORAL
  Filled 2021-07-16: qty 2

## 2021-07-16 MED ORDER — SACUBITRIL-VALSARTAN 24-26 MG PO TABS
1.0000 | ORAL_TABLET | Freq: Two times a day (BID) | ORAL | Status: DC
  Administered 2021-07-16 – 2021-07-17 (×2): 1 via ORAL
  Filled 2021-07-16 (×3): qty 1

## 2021-07-16 MED ORDER — EZETIMIBE 10 MG PO TABS
10.0000 mg | ORAL_TABLET | Freq: Every day | ORAL | Status: DC
  Administered 2021-07-17: 10 mg via ORAL
  Filled 2021-07-16: qty 1

## 2021-07-16 MED ORDER — SODIUM CHLORIDE 0.9% FLUSH
3.0000 mL | Freq: Two times a day (BID) | INTRAVENOUS | Status: DC
  Administered 2021-07-16 – 2021-07-17 (×2): 3 mL via INTRAVENOUS

## 2021-07-16 MED ORDER — APIXABAN 5 MG PO TABS
5.0000 mg | ORAL_TABLET | Freq: Two times a day (BID) | ORAL | Status: DC
Start: 2021-07-16 — End: 2021-07-16

## 2021-07-16 MED ORDER — SODIUM CHLORIDE 0.9% FLUSH: 3.0000 mL | INTRAVENOUS | Status: DC | PRN

## 2021-07-16 MED ORDER — NITROGLYCERIN 0.4 MG SL SUBL: 0.4000 mg | SUBLINGUAL_TABLET | SUBLINGUAL | Status: DC | PRN

## 2021-07-16 MED ORDER — ACETAMINOPHEN 325 MG PO TABS: 650.0000 mg | ORAL_TABLET | Freq: Four times a day (QID) | ORAL | Status: DC | PRN

## 2021-07-16 MED ORDER — SODIUM CHLORIDE 0.9 % IV SOLN: 250.0000 mL | INTRAVENOUS | Status: DC | PRN

## 2021-07-16 NOTE — Assessment & Plan Note (Signed)
Low magnesium. repleted orally then 2g today ?Repeat in AM  ?

## 2021-07-16 NOTE — Assessment & Plan Note (Signed)
Stable with no signs of exacerbation  ?Echo in 08/2018 with EF of 30-35% and apical hypokinesis ?ICD placed 09/2018  ?Continue medical management with entresto, toprol ? ?

## 2021-07-16 NOTE — ED Notes (Signed)
RN called into patients room. Patient agitated and raising voice at RN. Stating "haven't you noticed I'm not on the monitor? You didn't help me to the bathroom or hook me back up after." RN attempted to explain to patient however patient told RN to shut up and leave. RN offered food and coffee to patient.  ?

## 2021-07-16 NOTE — H&P (Addendum)
?History and Physical  ? ? ?Patient: Casey [name] KBT:[id number] DOB: 03/11/1971 ?DOA: 07/15/2021 ?DOS: the patient was seen and examined on 07/16/2021 ?PCP: Patient, No Pcp Per (Inactive)  ?Patient coming from:  [facility]  - lives alone  ? ? ?Chief Complaint: hallucinations  ? ?HPI: Casey [name] is a 51 y.o. female with medical history significant of CAD, ischemic cardiomyopathy, CHF, cardiac thrombus, GERD who presented to ED with hx of hallucinations that had resolved, but wanted her labs checked. She states a week ago she had a headache (seen in ED and resolved) and then on Thursday she started to have hallucinations. She saw a demon in the shadows and it kept bothering her all night and she couldn't sleep. Friday she states she saw it again and it was coming under the bed and playing with her feet. She then thought the demon was causing problems with the phone and radio. She went and stayed at her daughters the next day and had no hallucinations. She talked to her mother in law who thought it could be medication related so she called her cardiologist and she stopped taking the nexlizet and started back her zetia. Since she stopped the Nexlizet she has had no more hallucinations. She went to the ED on Saturday to get checked out and have some blood work done.  Her last hallucination was Friday and family reports she has been her normal self.  ? ?Never had any thoughts of SI/HI.  ? ? ?He has been feeling good. Denies any fever/chills, vision changes/headaches, chest pain or palpitations, shortness of breath or cough, abdominal pain, N/V/D, dysuria or leg swelling. She denies any bleeding from anywhere, no fatigue.  ? ?Never had colonoscopy and she isn't sure about colon cancer in the family, but no immediate family member. No hx of sickle cell disease  ? ?She does smoke, hasn't smoked in 2 days. She drinks alcohol 2 glasses/day. She hasn't drank this week.  ? ?ER Course:  vitals: afebrile, bp: 126/55, HR: 90,  RR: 17, oxygen: 100% RA ?Pertinent labs: magnesium: 1.2, potassium 2.9, hgb 8.3 platelets 114, fecal occult negative,  ?CT  head: no acute finding ?In ED: given of potassium Saturday and Sunday ?Given oral mag on Saturday and 2g magnesium today  ?Hematology consulted, Dr. Pollie Friar.  ? ? ? ?Review of Systems: As mentioned in the history of present illness. All other systems reviewed and are negative. ?Past Medical History:  ?Diagnosis Date  ? Anticoagulation adequate 10/14/2015  ? CAD (coronary artery disease)   ? Coronary atherosclerosis of native coronary artery, multivessel with plans for CABG to be followed   08/04/2015  ? Ischemic cardiomyopathy   ? UTI (urinary tract infection) 10/14/2015  ? ?Past Surgical History:  ?Procedure Laterality Date  ? CARDIAC CATHETERIZATION N/A 08/04/2015  ? Procedure: Coronary/Graft Angiography;  Surgeon: Peter M Swaziland, MD;  Location: [facility] INVASIVE CV LAB;  Service: Cardiovascular;  Laterality: N/A;  ? CARDIAC CATHETERIZATION N/A 10/10/2015  ? Procedure: Left Heart Cath and Coronary Angiography;  Surgeon: Lennette Bihari, MD;  Location: [facility] INVASIVE CV LAB;  Service: Cardiovascular;  Laterality: N/A;  ? CESAREAN SECTION    ? ICD IMPLANT N/A 09/29/2018  ? Procedure: ICD IMPLANT;  Surgeon: Marinus Maw, MD;  Location: [facility] INVASIVE CV LAB;  Service: Cardiovascular;  Laterality: N/A;  ? ?Social History:  reports that she has been smoking cigarettes. She has been smoking an average of .5 packs per day. She has never used  smokeless tobacco. She reports current alcohol use. She reports that she does not use drugs. ? ?Allergies  ?Allergen Reactions  ? Bempedoic Acid Other (See Comments)  ?  hallucinations  ? ? ?Family History  ?Problem Relation Age of Onset  ? Diabetes Mellitus II Neg Hx   ? CAD Neg Hx   ? ? ?Prior to Admission medications   ?Medication Sig Start Date End Date Taking? Authorizing Provider  ?apixaban (ELIQUIS) 5 MG TABS tablet TAKE 1 TABLET TWICE DAILY (LABS NEEDED FOR FURTHER  REFILLS) ?Patient taking differently: Take 5 mg by mouth 2 (two) times daily. 05/10/21  Yes Lewayne Bunting, MD  ?atorvastatin (LIPITOR) 80 MG tablet TAKE 1 TABLET EVERY DAY ?Patient taking differently: Take 80 mg by mouth daily. 05/18/21  Yes Crenshaw, Madolyn Frieze, MD  ?ENTRESTO 24-26 MG TAKE 1 TABLET TWICE DAILY ?Patient taking differently: 1 tablet 2 (two) times daily. 12/06/20  Yes Lewayne Bunting, MD  ?ezetimibe (ZETIA) 10 MG tablet Take 1 tablet (10 mg total) by mouth daily. 07/14/21  Yes Pavero, Cristal Deer, RPH  ?metoprolol succinate (TOPROL-XL) 100 MG 24 hr tablet Take 1 tablet (100 mg total) by mouth daily. 09/29/20  Yes Lewayne Bunting, MD  ?nitroGLYCERIN (NITROSTAT) 0.4 MG SL tablet Place 1 tablet (0.4 mg total) under the tongue every 5 (five) minutes as needed for chest pain. 04/22/18 09/14/22 Yes Kroeger, Ovidio Kin., PA-C  ?pantoprazole (PROTONIX) 40 MG tablet TAKE 1 TABLET (40 MG TOTAL) BY MOUTH DAILY. 02/15/21  Yes Lewayne Bunting, MD  ?Bempedoic Acid-Ezetimibe (NEXLIZET) 180-10 MG TABS Take 1 tablet by mouth daily.    [provider]  ? ? ?Physical Exam: ?Vitals:  ? 07/16/21 1540 07/16/21 1545 07/16/21 1550 07/16/21 1555  ?BP:      ?Pulse: 72 76 76 77  ?Resp: (!) 22 18 19  (!) 21  ?Temp:      ?TempSrc:      ?SpO2: 100% 100% 99% 100%  ?Weight:      ?Height:      ? ?General:  Appears calm and comfortable and is in NAD ?Eyes:  PERRL, EOMI, normal lids, iris ?ENT:  grossly normal hearing, lips & tongue, mmm; missing some teeth ?Neck:  no LAD, masses or thyromegaly; no carotid bruits ?Cardiovascular:  RRR, no m/r/g. No LE edema.  ?Respiratory:   CTA bilaterally with no wheezes/rales/rhonchi.  Normal respiratory effort. ?Abdomen:  soft, NT, ND, NABS ?Back:   normal alignment, no CVAT ?Skin:  no rash or induration seen on limited exam ?Musculoskeletal:  grossly normal tone BUE/BLE, good ROM, no bony abnormality ?Lower extremity:  No LE edema.  Limited foot exam with no ulcerations.  2+ distal  pulses. ?Psychiatric:  grossly normal mood and affect, speech fluent and appropriate, Aox3. No SI/HI/AH/VH ?Neurologic:  CN 2-12 grossly intact, moves all extremities in coordinated fashion, sensation intact ? ? ?Radiological Exams on Admission: ?Independently reviewed - see discussion in A/P where applicable ? ?CT Head Wo Contrast ? ?Result Date: 07/15/2021 ?CLINICAL DATA:  Delirium EXAM: CT HEAD WITHOUT CONTRAST TECHNIQUE: Contiguous axial images were obtained from the base of the skull through the vertex without intravenous contrast. RADIATION DOSE REDUCTION: This exam was performed according to the departmental dose-optimization program which includes automated exposure control, adjustment of the mA and/or kV according to patient size and/or use of iterative reconstruction technique. COMPARISON:  CT head 07/09/2021 FINDINGS: Brain: No evidence of large-territorial acute infarction. No parenchymal hemorrhage. No mass lesion. No extra-axial collection. No mass effect  or midline shift. No hydrocephalus. Basilar cisterns are patent. Vascular: No hyperdense vessel. Skull: No acute fracture or focal lesion. Sinuses/Orbits: Paranasal sinuses and mastoid air cells are clear. The orbits are unremarkable. Other: None. IMPRESSION: No acute intracranial abnormality. Electronically Signed   By: Tish Frederickson M.D.   On: 07/15/2021 22:46   ? ?EKG: Independently reviewed.  NSR with rate 84; nonspecific ST changes with no evidence of acute ischemia ? ? ?Labs on Admission: I have personally reviewed the available labs and imaging studies at the time of the admission. ? ?Pertinent labs:   ?magnesium: 1.2>1.4 ?potassium 2.9>3.1 ?hgb 8.3  ?platelets 114, ? fecal occult negative,  ? ?Assessment and Plan: ?Active Problems: ?  Thrombocytopenia (HCC) ?  Microcytic anemia ?  Hallucinations ?  Hypokalemia ?  Hypomagnesemia ?  NSVT (nonsustained ventricular tachycardia) (HCC) ?  Apical mural thrombus ?  Cardiomyopathy, ischemic ?   Coronary atherosclerosis of native coronary artery, multivessel with plans for CABG to be followed closely   ?  ? ?Assessment and Plan: ?Thrombocytopenia (HCC) ?51 year old female presenting to ED for labs after having a

## 2021-07-16 NOTE — Assessment & Plan Note (Addendum)
51 year old female presenting to ED for labs after having a headache then 2-3 days of auditory and visual hallucinations that have resolved found to have new anemia and thrombocytopenia with concerns for possible TTP  ?No history of low platelets in records I could find. Were 114 at time of admit.  ?-no signs or symptoms of bleeding/bruising.  ?-DIC panel wnl ?-Peripheral smear pending  ?-concern for new onset TTP. hematology consulted in ED and recommended hemolysis labs and they will see  ?-only + symptom was headache and then hallucinations which have resolved never confused.  ?-ADAMTs13 pending. Plasmic score of 5 (intermediate risk)  ?-hold PM dose of eliquis until repeat cbc/labs result ?-check ANA ?

## 2021-07-16 NOTE — Assessment & Plan Note (Signed)
Noted in ED, 20 second run ?repleting electrolytes, continue telemetry  ?

## 2021-07-16 NOTE — Assessment & Plan Note (Signed)
Auditory and visual hallucinations that started this past week on thursday after experiencing a bad headache on Sunday.  ?Hallucinations resolved after stopping new cholesterol medication Nexlizet ?She has no other symptoms and denies any SI/HI ?CT head with no acute finding and metabolic work up uneventful  ?Continue to monitor  ? ?

## 2021-07-16 NOTE — Assessment & Plan Note (Addendum)
Found on echo and cardiac MRI in July of 2017, seen by Dr. Roxy Manns recommended medical management ?Has been on eliquis. Last dose this AM. Holding PM dose in setting of possible new TTP.  ?

## 2021-07-16 NOTE — Assessment & Plan Note (Signed)
Magnesium low, no exogenous losses ?repleting both mag and potassium ?Keep on tele ?Follow labs in AM  ?

## 2021-07-16 NOTE — ED Notes (Signed)
Patient given coffee and microwave meal at this time. Patient taking home meds when RN walked into room.  ?

## 2021-07-16 NOTE — Plan of Care (Signed)
Patient is alert and oriented. Aware of situation and surroundings. Denies hallucinations. Will continue to observe. ?

## 2021-07-16 NOTE — Assessment & Plan Note (Signed)
No chest pain complaints  ?July 2017 and cardiac cath showed a 70% ramus, 50% followed by 55% LAD and occluded right coronary artery. Ejection 30-35%. ?Continue statin/beta blocker/zetia  ?

## 2021-07-16 NOTE — Assessment & Plan Note (Addendum)
History of normocytic anemia over the past 2 years, but most recent CBC showed a hemoglobin of 10.2 that was six months ago and now is at 8.3. she is asymptomatic. platelets also low with concern for TTP ?-denies any bleeding, GI complaints, fatigue, dizziness. Only +ROS was headache  ?-fecal occult negative ?-check iron studies ?-LDH elevated  ?-haptoglobin pending  ?-smear pending  ?-b12/folate ?-does drink alcohol on daily basis, but not excessive  ?-no hx of sickle cell  ?-repeat CBC now, holding pm dose of eliquis with concerns for new TTP ?-needs outpatient colon cancer screening  ?

## 2021-07-17 ENCOUNTER — Encounter (HOSPITAL_COMMUNITY): Payer: Self-pay | Admitting: Oncology

## 2021-07-17 ENCOUNTER — Other Ambulatory Visit (HOSPITAL_BASED_OUTPATIENT_CLINIC_OR_DEPARTMENT_OTHER): Payer: Self-pay

## 2021-07-17 ENCOUNTER — Telehealth: Payer: Self-pay

## 2021-07-17 DIAGNOSIS — I513 Intracardiac thrombosis, not elsewhere classified: Secondary | ICD-10-CM

## 2021-07-17 DIAGNOSIS — I255 Ischemic cardiomyopathy: Secondary | ICD-10-CM

## 2021-07-17 DIAGNOSIS — D649 Anemia, unspecified: Secondary | ICD-10-CM

## 2021-07-17 DIAGNOSIS — G9340 Encephalopathy, unspecified: Secondary | ICD-10-CM | POA: Diagnosis not present

## 2021-07-17 LAB — BASIC METABOLIC PANEL
Anion gap: 8 (ref 5–15)
BUN: 12 mg/dL (ref 6–20)
CO2: 25 mmol/L (ref 22–32)
Calcium: 9.5 mg/dL (ref 8.9–10.3)
Chloride: 104 mmol/L (ref 98–111)
Creatinine, Ser: 0.87 mg/dL (ref 0.44–1.00)
GFR, Estimated: >60 mL/min (ref >60)
Glucose, Bld: 109 mg/dL — ABNORMAL HIGH (ref 70–99)
Potassium: 3.5 mmol/L (ref 3.5–5.1)
Sodium: 137 mmol/L (ref 135–145)

## 2021-07-17 LAB — CBC
HCT: 23.8 % — ABNORMAL LOW (ref 36.0–46.0)
Hemoglobin: 8 g/dL — ABNORMAL LOW (ref 12.0–15.0)
MCH: 28.1 pg (ref 26.0–34.0)
MCHC: 33.6 g/dL (ref 30.0–36.0)
MCV: 83.5 fL (ref 80.0–100.0)
Platelets: 115 10*3/uL — ABNORMAL LOW (ref 150–400)
RBC: 2.85 MIL/uL — ABNORMAL LOW (ref 3.87–5.11)
RDW: 22 % — ABNORMAL HIGH (ref 11.5–15.5)
WBC: 5.7 10*3/uL (ref 4.0–10.5)
nRBC: 0 % (ref 0.0–0.2)

## 2021-07-17 LAB — HAPTOGLOBIN: Haptoglobin: 192 mg/dL (ref 42–296)

## 2021-07-17 LAB — FOLATE: Folate: 9.1 ng/mL (ref >5.9)

## 2021-07-17 LAB — MAGNESIUM: Magnesium: 1.7 mg/dL (ref 1.7–2.4)

## 2021-07-17 LAB — ADAMTS13 ACTIVITY REFLEX

## 2021-07-17 LAB — HIV ANTIBODY (ROUTINE TESTING W REFLEX): HIV Screen 4th Generation wRfx: NONREACTIVE

## 2021-07-17 LAB — ADAMTS13 ACTIVITY: Adamts 13 Activity: 66.7 % — ABNORMAL LOW (ref >66.8)

## 2021-07-17 MED ORDER — FERROUS SULFATE 325 (65 FE) MG PO TABS
325.0000 mg | ORAL_TABLET | Freq: Every day | ORAL | 3 refills | Status: DC
  Filled 2021-07-17: qty 30, 30d supply, fill #0

## 2021-07-17 MED ORDER — CYANOCOBALAMIN 1000 MCG/ML IJ SOLN
1000.0000 ug | Freq: Every day | INTRAMUSCULAR | Status: DC
  Administered 2021-07-17: 1000 ug via SUBCUTANEOUS
  Filled 2021-07-17: qty 1

## 2021-07-17 MED ORDER — APIXABAN 5 MG PO TABS
5.0000 mg | ORAL_TABLET | Freq: Two times a day (BID) | ORAL | Status: DC
  Administered 2021-07-17: 5 mg via ORAL
  Filled 2021-07-17: qty 1

## 2021-07-17 MED ORDER — FERROUS SULFATE 325 (65 FE) MG PO TABS: 325.0000 mg | ORAL_TABLET | Freq: Every day | ORAL | Status: DC

## 2021-07-17 MED ORDER — VITAMIN B-12 1000 MCG PO TABS
1000.0000 ug | ORAL_TABLET | Freq: Every day | ORAL | 1 refills | Status: DC
  Filled 2021-07-17: qty 30, 30d supply, fill #0

## 2021-07-17 NOTE — Progress Notes (Signed)
?  Transition of Care (TOC) Screening Note ? ? ?Patient Details  ?Name: Casey [name] ?Date of Birth: 08/10/1970 ? ? ?Transition of Care (TOC) CM/SW Contact:    ?Cyndi Bender, RN ?Phone Number: ?07/17/2021, 7:55 AM ? ? ? ?Transition of Care Department [facility]) has reviewed patient We will continue to monitor patient advancement through interdisciplinary progression rounds and therapy requests ?For needs ?

## 2021-07-17 NOTE — Discharge Summary (Signed)
? ?PATIENT DETAILS ?Name: Casey [name] ?Age: 51 y.o. ?Sex: female ?Date of Birth: [birth year] ?MRN: [id number]. ?Admitting Physician: No admitting provider for patient encounter. ?[id number], No Pcp Per (Inactive) ? ?Admit Date: 07/15/2021 ?Discharge date: 07/17/2021 ? ?Recommendations for Outpatient Follow-up:  ?Follow up with PCP in 1-2 weeks ?Please obtain CMP/CBC in one week ?Please ensure follow up with Hem/Onc ? ?Admitted From:  ?Home ? ?Disposition: ?Home ?  ?Discharge Condition: ?good ? ?CODE STATUS: ?  Code Status: Full Code  ? ?Diet recommendation:  ?Diet Order   ? ?       ?  Diet - low sodium heart healthy       ?  ?  Diet Heart Room service appropriate? Yes; Fluid consistency: Thin  Diet effective now       ?  ? ?  ?  ? ?  ?  ? ?Brief Summary: ?51 year old female with history of HFrEF-cardiac thrombus-who had a brief episode of hallucinations-upon routine lab draw-was found to have new onset anemia and thrombocytopenia-and subsequently admitted to the hospitalist service. ? ?Brief Hospital Course: ?Anemia/thrombocytopenia: New since approximately 6 months back-however stable and relatively mild for the past several days.  Not felt to have hemolysis or TTP.  Unclear etiology-vitamin B-12 levels borderline-has been started on supplementation.  Evaluated by hematology-discussed with Dr Lindi Adie- recommendations to start Fe supplemention-Dr Lindi Adie will arrange for repeat CBC in 1 month ? ?Hallucinations: Acute a few days the week before-and none for the past several days.  Unclear whether this is related to initiation of a new medication-Nexlizet.  Per patient-apparently her symptoms resolved after she discontinued Nexlizet.  This medication will be held on discharge and she will follow-up with the primary cardiology/PCP for further continued care. ? 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  Patient to follow-up with a cardiologist.   ? ?BMI: ?Estimated body mass index is 26.63 kg/m? as calculated from the following: ?  Height as of this encounter: 5\' 5"  (1.651 m). ?  Weight as of this encounter: 72.6 kg.  ? ? ?Discharge Diagnoses:  ?Active Problems: ?  Thrombocytopenia ([place]) ?  Microcytic anemia ?  Hallucinations ?  Hypokalemia ?  Hypomagnesemia ?  NSVT (nonsustained ventricular tachycardia) ([place]) ?  Apical mural thrombus ?  Cardiomyopathy, ischemic ?  Coronary atherosclerosis of native coronary artery, multivessel with plans for CABG to be followed closely   ? ? ?Discharge Instructions: ? ?Activity:  ?As tolerated  ? ?Discharge Instructions   ? ? Diet - low sodium heart healthy   Complete by: As directed ?  ? Discharge instructions   Complete by: As directed ?  ? Follow with Primary MD  Patient in 1-2 weeks ? ?Please get a complete blood count and chemistry panel checked by your Primary MD at your next visit, and again as instructed by your Primary MD. ? ?Get Medicines reviewed and adjusted: ?Please take all your medications with you for your next visit with your Primary MD ? ?Laboratory/radiological data: ?Please request your Primary MD to go over all hospital tests and procedure/radiological results at the follow up, please ask your Primary MD to get all Hospital records sent to his/her office. ? ?In some cases, they will be blood work, cultures and biopsy results pending at the time of your discharge. Please request that your primary care M.D. follows up on these results. ? ?Also Note the following: ?If you experience worsening of your admission symptoms, develop  shortness of breath, life threatening emergency, suicidal or homicidal thoughts you must seek medical attention immediately by calling 911 or calling your MD immediately  if symptoms less severe. ? ?You must read complete instructions/literature along with all the possible adverse reactions/side effects for all the Medicines you take and that have been prescribed to you. Take any  new Medicines after you have completely understood and accpet all the possible adverse reactions/side effects.  ? ?Do not drive when taking Pain medications or sleeping medications (Benzodaizepines) ? ?Do not take more than prescribed Pain, Sleep and Anxiety Medications. It is not advisable to combine anxiety,sleep and pain medications without talking with your primary care practitioner ? ?Special Instructions: If you have smoked or chewed Tobacco  in the last 2 yrs please stop smoking, stop any regular Alcohol  and or any Recreational drug use. ? ?Wear Seat belts while driving. ? ?Please note: ?You were cared for by a hospitalist during your hospital stay. Once you are discharged, your primary care physician will handle any further medical issues. Please note that NO REFILLS for any discharge medications will be authorized once you are discharged, as it is imperative that you return to your primary care physician (or establish a relationship with a primary care physician if you do not have one) for your post hospital discharge needs so that they can reassess your need for medications and monitor your lab values.  ? Increase activity slowly   Complete by: As directed ?  ? ?  ? ?Allergies as of 07/17/2021   ? ?   Reactions  ? Bempedoic Acid Other (See Comments)  ? hallucinations  ? ?  ? ?  ?Medication List  ?  ? ?STOP taking these medications   ? ?Nexlizet 180-10 MG Tabs ?Generic drug: Bempedoic Acid-Ezetimibe ?  ? ?  ? ?TAKE these medications   ? ?apixaban 5 MG Tabs tablet ?Commonly known as: Eliquis ?TAKE 1 TABLET TWICE DAILY (LABS NEEDED FOR FURTHER REFILLS) ?What changed:  ?how much to take ?how to take this ?when to take this ?additional instructions ?  ?atorvastatin 80 MG tablet ?Commonly known as: LIPITOR ?TAKE 1 TABLET EVERY DAY ?  ?Entresto 24-26 MG ?Generic drug: sacubitril-valsartan ?TAKE 1 TABLET TWICE DAILY ?What changed: how to take this ?  ?ezetimibe 10 MG tablet ?Commonly known as: ZETIA ?Take 1 tablet  (10 mg total) by mouth daily. ?  ?ferrous sulfate 325 (65 FE) MG tablet ?Take 1 tablet (325 mg total) by mouth daily with breakfast. ?Start taking on: July 18, 2021 ?  ?metoprolol succinate 100 MG 24 hr tablet ?Commonly known as: TOPROL-XL ?Take 1 tablet (100 mg total) by mouth daily. ?  ?nitroGLYCERIN 0.4 MG SL tablet ?Commonly known as: NITROSTAT ?Place 1 tablet (0.4 mg total) under the tongue every 5 (five) minutes as needed for chest pain. ?  ?pantoprazole 40 MG tablet ?Commonly known as: PROTONIX ?TAKE 1 TABLET (40 MG TOTAL) BY MOUTH DAILY. ?  ?vitamin B-12 1000 MCG tablet ?Commonly known as: CYANOCOBALAMIN ?Take 1 tablet (1,000 mcg total) by mouth daily. ?  ? ?  ? ? Follow-up Information   ? ? Madalyn Rob, MD Follow up.   ?Specialty: Internal Medicine ?Why: TIME: 1:15 PM ?DATE: July 27, 2021 ?Contact information: ?[address] ?[id number] ?[place] [address] ?[phone] ? ? ?  ?  ? ? Nicholas Lose, MD Follow up.   ?Specialty: Hematology and Oncology ?Why: Office will call with date/time, If you dont hear from  them,please give them a call, Hospital follow up ?Contact information: ?[place] [place] ?[place] [id number] ?[phone] ? ? ?  ?  ? ?  ?  ? ?  ? ?Allergies  ?Allergen Reactions  ? Bempedoic Acid Other (See Comments)  ?  hallucinations  ? ? ? ?Other Procedures/Studies: ?CT Head Wo Contrast ? ?Result Date: 07/15/2021 ?CLINICAL DATA:  Delirium EXAM: CT HEAD WITHOUT CONTRAST TECHNIQUE: Contiguous axial images were obtained from the base of the skull through the vertex without intravenous contrast. RADIATION DOSE REDUCTION: This exam was performed according to the departmental dose-optimization program which includes automated exposure control, adjustment of the mA and/or kV according to patient size and/or use of iterative reconstruction technique. COMPARISON:  CT head 07/09/2021 FINDINGS: Brain: No evidence of large-territorial acute infarction. No parenchymal hemorrhage. No mass  lesion. No extra-axial collection. No mass effect or midline shift. No hydrocephalus. Basilar cisterns are patent. Vascular: No hyperdense vessel. Skull: No acute fracture or focal lesion. Sinuses/Orbits: Par

## 2021-07-17 NOTE — Care Management Obs Status (Signed)
MEDICARE OBSERVATION STATUS NOTIFICATION ? ? ?Patient Details  ?Name: Casey [name] ?MRN: [id number] ?Date of Birth: [birth year] ? ? ?Medicare Observation Status Notification Given:  Yes ? ? ? ?Harriet Masson, RN ?07/17/2021, 2:00 PM ?

## 2021-07-17 NOTE — Progress Notes (Deleted)
HPI:FU coronary artery disease. Patient previously admitted with abdominal pain. Abdominal CT showed thrombus at the LV apex. Echocardiogram May 2017 showed ejection fraction 35-40% with anterior and apical wall motion abnormality. Grade 2 diastolic dysfunction. Thrombus at the apex. Mild mitral regurgitation and mild left atrial enlargement. Cardiac catheterization May 2017 showed a 90% proximal to mid LAD, 60% ramus and 50% right coronary artery. It was felt that she had likely infarcted the LAD territory and medical therapy recommended unless she has recurrent symptoms. She was placed on apixaban. Readmitted July 2017 and cardiac cath showed a 70% ramus, 50% followed by 55% LAD and occluded right coronary artery. Ejection 30-35%.  Apical thrombus noted. Cardiac MRI July 2017 showed ejection fraction 35%. There was a large apical thrombus. There was mild mitral regurgitation.  Patient seen by Dr. Roxy Manns October 2017. Medical therapy recommended. There has been problems with compliance.  Last echocardiogram May 2020 showed ejection fraction 30 to 35%, moderate left ventricular hypertrophy.  Had ICD implanted June 2020. Since last seen,   No current facility-administered medications for this visit.   No current outpatient medications on file.   Facility-Administered Medications Ordered in Other Visits  Medication Dose Route Frequency Provider Last Rate Last Admin   0.9 %  sodium chloride infusion  250 mL Intravenous PRN Orma Flaming, MD       acetaminophen (TYLENOL) tablet 650 mg  650 mg Oral Q6H PRN Orma Flaming, MD       Or   acetaminophen (TYLENOL) suppository 650 mg  650 mg Rectal Q6H PRN Orma Flaming, MD       atorvastatin (LIPITOR) tablet 80 mg  80 mg Oral Daily Orma Flaming, MD       cyanocobalamin ((VITAMIN B-12)) injection 1,000 mcg  1,000 mcg Subcutaneous Daily Ghimire, Henreitta Leber, MD       ezetimibe (ZETIA) tablet 10 mg  10 mg Oral Daily Orma Flaming, MD       metoprolol  succinate (TOPROL-XL) 24 hr tablet 100 mg  100 mg Oral Daily Orma Flaming, MD       nitroGLYCERIN (NITROSTAT) SL tablet 0.4 mg  0.4 mg Sublingual Q5 min PRN Orma Flaming, MD       pantoprazole (PROTONIX) EC tablet 40 mg  40 mg Oral Daily Orma Flaming, MD       sacubitril-valsartan (ENTRESTO) 24-26 mg per tablet  1 tablet Oral BID Orma Flaming, MD   1 tablet at 07/16/21 2119   sodium chloride flush (NS) 0.9 % injection 3 mL  3 mL Intravenous Q12H Orma Flaming, MD   3 mL at 07/16/21 2119   sodium chloride flush (NS) 0.9 % injection 3 mL  3 mL Intravenous PRN Orma Flaming, MD         Past Medical History:  Diagnosis Date   Anticoagulation adequate 10/14/2015   CAD (coronary artery disease)    Coronary atherosclerosis of native coronary artery, multivessel with plans for CABG to be followed   08/04/2015   Ischemic cardiomyopathy    UTI (urinary tract infection) 10/14/2015    Past Surgical History:  Procedure Laterality Date   CARDIAC CATHETERIZATION N/A 08/04/2015   Procedure: Coronary/Graft Angiography;  Surgeon: Peter M Martinique, MD;  Location: [facility] [facility];  Service: Cardiovascular;  Laterality: N/A;   CARDIAC CATHETERIZATION N/A 10/10/2015   Procedure: Left Heart Cath and Coronary Angiography;  Surgeon: Troy Sine, MD;  Location: [place] [facility];  Service: Cardiovascular;  Laterality: N/A;  CESAREAN SECTION     ICD IMPLANT N/A 09/29/2018   Procedure: ICD IMPLANT;  Surgeon: Evans Lance, MD;  Location: [place] [place];  Service: Cardiovascular;  Laterality: N/A;    Social History   Socioeconomic History   Marital status: Divorced    Spouse name: Not on file   Number of children: Not on file   Years of education: Not on file   Highest education level: Not on file  Occupational History   Not on file  Tobacco Use   Smoking status: Some Days    Packs/day: 0.50    Types: Cigarettes   Smokeless tobacco: Never  Vaping Use   Vaping Use: Never used  Substance  and Sexual Activity   Alcohol use: Yes    Alcohol/week: 0.0 standard drinks    Comment: Two drinks a night.   Drug use: No    Comment: No history of cocaine use   Sexual activity: Not on file  Other Topics Concern   Not on file  Social History Narrative   Not on file   Social Determinants of Health   Financial Resource Strain: Not on file  Food Insecurity: Not on file  Transportation Needs: Not on file  Physical Activity: Not on file  Stress: Not on file  Social Connections: Not on file  Intimate Partner Violence: Not on file    Family History  Problem Relation Age of Onset   Diabetes Mellitus II Neg Hx    CAD Neg Hx     ROS: no fevers or chills, productive cough, hemoptysis, dysphasia, odynophagia, melena, hematochezia, dysuria, hematuria, rash, seizure activity, orthopnea, PND, pedal edema, claudication. Remaining systems are negative.  Physical Exam: Well-developed well-nourished in no acute distress.  Skin is warm and dry.  HEENT is normal.  Neck is supple.  Chest is clear to auscultation with normal expansion.  Cardiovascular exam is regular rate and rhythm.  Abdominal exam nontender or distended. No masses palpated. Extremities show no edema. neuro grossly intact  ECG- personally reviewed  A/P  1 coronary artery disease-patient doing well with no chest pain.  Plan to continue medical therapy.  Patient is not on aspirin given need for anticoagulation.  Continue statin.  2 ischemic cardiomyopathy-continue Entresto and beta-blocker.  Check potassium and renal function.  3 history of left ventricular apical thrombus-we will continue apixaban at present dose.  Check hemoglobin and renal function.  4 previous ICD-managed by electrophysiology.  5 hyperlipidemia-continue statin.  6 tobacco abuse-patient again counseled on discontinuing.  Kirk Ruths, MD

## 2021-07-17 NOTE — Consult Note (Addendum)
? ?[facility]  ?[phone] [phone]  ? ? ?INITIAL HEMATOLOGY CONSULTATION ? ?Referring MD: Dr. Oren Binet  ? ?Reason for Referral: Anemia and thrombocytopenia ? ?HPI: Casey [name] is a 51 year old female the past medical history significant for CAD, ischemic cardiomyopathy, CHF, cardiac thrombus, GERD.  She presented to the emergency department with a history of hallucinations that had resolved but wanted her lab work checked.  1 week prior, she had had a headache and was seen in the emergency department and then the following Thursday started having hallucinations.  She thought it could be medication related so she called her cardiologist and stopped taking the nexlizet and started back on Zetia.  Since stopping this medication, she had no recurrent hallucinations.  Admission lab work was reviewed and her hemoglobin was 8.3 (had been 10.26 months prior), MCV 78.9, platelets 114,000, sodium 134, potassium 2.9, BUN 21, creatinine 1.08, AST 73.  LDH was mildly elevated at 290.  Absolute reticulocyte count was normal at 20.9 with an elevated immature reticulocyte fraction at 39.0%.  Haptoglobin pending.  A DIC panel was obtained which showed a normal PT/INR, PTT, fibrinogen level, and D-dimer.  We reviewed notes that schistocytes were present.  Pathologist smear review in process.  ADAMTS 13 has been sent.  Her ferritin was normal at 74, iron normal at 38, TIBC normal at 402, and percent saturation was low at 9%.  Vitamin B12 level was 245.  Folate level was 9.1. ? ?The patient reports that she is feeling better today.  She is no longer having any hallucinations.  She states that she had a headache on 1 occasion and no recurrent headaches.  She is not having any abdominal symptoms such as nausea or vomiting.  She has not noticed any jaundice.  Denies chest pain and shortness of breath.  No bleeding reported.  States that she has never had a history of a blood or platelet  transfusion.  The patient is divorced.  She has 4 adult children and 4 grandchildren.  Smokes about half a pack of cigarettes per day.  States that she drinks a medium to large sized bottle of liquor every 5 days.  Denies family history of blood disorders such as sickle cell disease and bleeding disorders and also denies history of hematologic malignancies.  Hematology was asked to see the patient to make recommendations regarding her anemia and thrombocytopenia. ? ?Past Medical History:  ?Diagnosis Date  ? Anticoagulation adequate 10/14/2015  ? CAD (coronary artery disease)   ? Coronary atherosclerosis of native coronary artery, multivessel with plans for CABG to be followed   08/04/2015  ? Ischemic cardiomyopathy   ? UTI (urinary tract infection) 10/14/2015  ?: ? ?Past Surgical History:  ?Procedure Laterality Date  ? CARDIAC CATHETERIZATION N/A 08/04/2015  ? Procedure: Coronary/Graft Angiography;  Surgeon: Peter M Martinique, MD;  Location: [place] [facility];  Service: Cardiovascular;  Laterality: N/A;  ? CARDIAC CATHETERIZATION N/A 10/10/2015  ? Procedure: Left Heart Cath and Coronary Angiography;  Surgeon: Troy Sine, MD;  Location: [place];  Service: Cardiovascular;  Laterality: N/A;  ? CESAREAN SECTION    ? ICD IMPLANT N/A 09/29/2018  ? Procedure: ICD IMPLANT;  Surgeon: Evans Lance, MD;  Location: [facility];  Service: Cardiovascular;  Laterality: N/A;  ?: ? ? ?CURRENT MEDS: ?Current Facility-Administered Medications  ?Medication Dose Route Frequency Provider Last Rate Last Admin  ? 0.9 %  sodium chloride infusion  250 mL  Intravenous PRN Orma Flaming, MD      ? acetaminophen (TYLENOL) tablet 650 mg  650 mg Oral Q6H PRN Orma Flaming, MD      ? Or  ? acetaminophen (TYLENOL) suppository 650 mg  650 mg Rectal Q6H PRN Orma Flaming, MD      ? atorvastatin (LIPITOR) tablet 80 mg  80 mg Oral Daily Orma Flaming, MD   80 mg at 07/17/21 [id number]  ? cyanocobalamin ((VITAMIN B-12)) injection 1,000 mcg   1,000 mcg Subcutaneous Daily Jonetta Osgood, MD   1,000 mcg at 07/17/21 [id number]  ? ezetimibe (ZETIA) tablet 10 mg  10 mg Oral Daily Orma Flaming, MD   10 mg at 07/17/21 [id number]  ? metoprolol succinate (TOPROL-XL) 24 hr tablet 100 mg  100 mg Oral Daily Orma Flaming, MD   100 mg at 07/17/21 [id number]  ? nitroGLYCERIN (NITROSTAT) SL tablet 0.4 mg  0.4 mg Sublingual Q5 min PRN Orma Flaming, MD      ? pantoprazole (PROTONIX) EC tablet 40 mg  40 mg Oral Daily Orma Flaming, MD   40 mg at 07/17/21 [id number]  ? sacubitril-valsartan (ENTRESTO) 24-26 mg per tablet  1 tablet Oral BID Orma Flaming, MD   1 tablet at 07/17/21 0856  ? sodium chloride flush (NS) 0.9 % injection 3 mL  3 mL Intravenous Q12H Orma Flaming, MD   3 mL at 07/17/21 0845  ? sodium chloride flush (NS) 0.9 % injection 3 mL  3 mL Intravenous PRN Orma Flaming, MD      ? ? ?Allergies  ?Allergen Reactions  ? Bempedoic Acid Other (See Comments)  ?  hallucinations  ?: ? ?Family History  ?Problem Relation Age of Onset  ? Diabetes Mellitus II Neg Hx   ? CAD Neg Hx   ?: ? ?Social History  ? ?Socioeconomic History  ? Marital status: Divorced  ?  Spouse name: Not on file  ? Number of children: Not on file  ? Years of education: Not on file  ? Highest education level: Not on file  ?Occupational History  ? Not on file  ?Tobacco Use  ? Smoking status: Some Days  ?  Packs/day: 0.50  ?  Types: Cigarettes  ? Smokeless tobacco: Never  ?Vaping Use  ? Vaping Use: Never used  ?Substance and Sexual Activity  ? Alcohol use: Yes  ?  Alcohol/week: 0.0 standard drinks  ?  Comment: Two drinks a night.  ? Drug use: No  ?  Comment: No history of cocaine use  ? Sexual activity: Not on file  ?Other Topics Concern  ? Not on file  ?Social History Narrative  ? Not on file  ? ?Social Determinants of Health  ? ?Financial Resource Strain: Not on file  ?Food Insecurity: Not on file  ?Transportation Needs: Not on file  ?Physical Activity: Not on file  ?Stress: Not on file  ?Social Connections:  Not on file  ?Intimate Partner Violence: Not on file  ?: ? ?REVIEW OF SYSTEMS:  A comprehensive 14 point review of systems was negative except as noted in the HPI.   ? ?Exam: ?Patient Vitals for the past 24 hrs: ? BP Temp Temp src Pulse Resp SpO2  ?07/17/21 0734 118/80 98.1 ?F (36.7 ?C) Oral 79 14 99 %  ?07/16/21 2318 115/73 98.2 ?F (36.8 ?C) Oral 85 17 100 %  ?07/16/21 2000 119/68 98.5 ?F (36.9 ?C) Oral 82 20 99 %  ?07/16/21 1555 -- -- -- 77 (!) 21 100 %  ?  07/16/21 1550 -- -- -- 76 19 99 %  ?07/16/21 1545 -- -- -- 76 18 100 %  ?07/16/21 1540 -- -- -- 72 (!) 22 100 %  ?07/16/21 1535 -- -- -- 70 15 100 %  ?07/16/21 1530 -- -- -- 82 15 99 %  ?07/16/21 1525 95/65 98.9 ?F (37.2 ?C) Oral 79 16 100 %  ?07/16/21 1520 131/84 -- -- 74 13 100 %  ?07/16/21 1435 -- -- -- -- (!) 21 --  ?07/16/21 1430 117/70 -- -- 79 19 100 %  ?07/16/21 1425 -- -- -- 78 14 100 %  ?07/16/21 1420 -- -- -- 72 20 100 %  ?07/16/21 1415 -- -- -- 71 14 100 %  ?07/16/21 1410 -- -- -- 71 14 100 %  ?07/16/21 1405 -- -- -- 75 14 100 %  ?07/16/21 1400 (!) 79/45 -- -- 76 20 100 %  ?07/16/21 1355 -- -- -- 79 16 100 %  ?07/16/21 1350 -- -- -- (!) 52 (!) 21 (!) 83 %  ?07/16/21 1345 -- -- -- 73 15 100 %  ?07/16/21 1340 -- -- -- 74 17 100 %  ?07/16/21 1335 -- -- -- 70 16 100 %  ?07/16/21 1330 105/61 -- -- 72 14 95 %  ?07/16/21 1325 -- -- -- 70 14 99 %  ?07/16/21 1320 -- -- -- 79 17 100 %  ?07/16/21 1315 -- -- -- 85 (!) 24 99 %  ?07/16/21 1310 106/90 -- -- 82 14 97 %  ?07/16/21 1309 106/90 -- -- 80 16 100 %  ?07/16/21 1305 -- -- -- -- (!) 28 --  ?07/16/21 1255 -- -- -- -- (!) 22 --  ?07/16/21 1250 -- -- -- 86 18 96 %  ?07/16/21 1245 -- -- -- (!) 120 (!) 22 (!) 83 %  ?07/16/21 1240 -- -- -- 74 14 99 %  ?07/16/21 1235 -- -- -- 74 14 99 %  ?07/16/21 1230 100/62 -- -- 74 13 99 %  ?07/16/21 1225 -- -- -- 74 16 99 %  ?07/16/21 1220 -- -- -- 77 15 100 %  ?07/16/21 1217 -- 98.7 ?F (37.1 ?C) Oral 74 14 100 %  ?07/16/21 1215 -- -- -- 74 15 97 %  ?07/16/21 1210 -- -- -- 78 16  100 %  ?07/16/21 1205 -- -- -- 66 17 97 %  ?07/16/21 1200 91/65 -- -- 73 14 100 %  ?07/16/21 1130 100/60 -- -- 77 16 100 %  ? ? ?Physical Exam ?Vitals reviewed.  ?Constitutional:   ?   General: She is not

## 2021-07-17 NOTE — Telephone Encounter (Signed)
RN from upstairs  called requested an [facility] for pt being discharged this week. Was given 4/27 @ 1:15 with Dr Barbaraann Faster  ?

## 2021-07-17 NOTE — TOC Initial Note (Signed)
Transition of Care (TOC) - Initial/Assessment Note  ? ? ?Patient Details  ?Name: [name] ?MRN: [id number] ?Date of Birth: 1971-03-30 ? ?Transition of Care (TOC) CM/SW Contact:    ?Harriet Masson, RN ?Phone Number: ?07/17/2021, 2:01 PM ? ?Clinical Narrative:                 ?Spoke to patient regarding transition needs. Patient stated she would like TOC to make PCP apt. Referral sent to CMA to make PCP apt. Notified patient apt information will be on discharge instructions.  ?TOC will continue to follow for needs. ? ?Expected Discharge Plan: Home/Self Care ?Barriers to Discharge: Continued Medical Work up ? ? ?Patient Goals and CMS Choice ?Patient states their goals for this hospitalization and ongoing recovery are:: return home ?  ?  ? ?Expected Discharge Plan and Services ?Expected Discharge Plan: Home/Self Care ?In-house Referral: PCP / [facility] ?Discharge Planning Services: Follow-up appt scheduled ?  ?Living arrangements for the past 2 months: Apartment ?                ?  ?  ?  ?  ?  ?  ?  ?  ?  ?  ? ?Prior Living Arrangements/Services ?Living arrangements for the past 2 months: Apartment ?  ?Patient language and need for interpreter reviewed:: Yes ?Do you feel safe going back to the place where you live?: Yes      ?  ?  ?  ?Criminal Activity/Legal Involvement Pertinent to Current Situation/Hospitalization: No - Comment as needed ? ?Activities of Daily Living ?Home Assistive Devices/Equipment: None ?ADL Screening (condition at time of admission) ?Patient's cognitive ability adequate to safely complete daily activities?: Yes ?Is the patient deaf or have difficulty hearing?: No ?Does the patient have difficulty seeing, even when wearing glasses/contacts?: No ?Does the patient have difficulty concentrating, remembering, or making decisions?: No ?Patient able to express need for assistance with ADLs?: Yes ?Does the patient have difficulty dressing or bathing?: No ?Independently performs ADLs?: Yes  (appropriate for developmental age) ?Does the patient have difficulty walking or climbing stairs?: No ?Weakness of Legs: None ? ?Permission Sought/Granted ?Permission sought to share information with : Case Manager ?  ?   ?   ?   ?   ? ?Emotional Assessment ?  ?  ?  ?  ?  ?  ? ?Admission diagnosis:  Thrombocytopenia (HCC) [D69.6] ?Hallucinations, visual [R44.1] ?Acute encephalopathy [G93.40] ?Anemia, unspecified type [D64.9] ?Patient Active Problem List  ? Diagnosis Date Noted  ? Hallucinations 07/16/2021  ? Microcytic anemia 07/16/2021  ? Thrombocytopenia (HCC) 07/16/2021  ? Hypokalemia 07/16/2021  ? Hypomagnesemia 07/16/2021  ? NSVT (nonsustained ventricular tachycardia) (HCC) 07/16/2021  ? Hyperlipidemia 06/29/2021  ? GERD (gastroesophageal reflux disease) 05/09/2017  ? Anticoagulation adequate 10/14/2015  ? Cardiomyopathy, ischemic 08/04/2015  ? Coronary atherosclerosis of native coronary artery, multivessel with plans for CABG to be followed closely   08/04/2015  ? 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 Tobacco abuse 08/01/2015  ? 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 Apical mural thrombus   ? ?PCP:  Patient, No Pcp Per (Inactive) ?Pharmacy:   ?[facility] ?[address] ?[place] [address] ?Phone: [phone] Fax: [phone] ? ? ? ? ?Social Determinants of Health (SDOH) Interventions ?  ? ?Readmission Risk Interventions ?   ? View : No data to display.  ?  ?  ?  ? ? ? ?

## 2021-07-18 ENCOUNTER — Telehealth: Payer: Self-pay | Admitting: Hematology and Oncology

## 2021-07-18 LAB — ANA W/REFLEX IF POSITIVE: Anti Nuclear Antibody (ANA): NEGATIVE

## 2021-07-18 LAB — PATHOLOGIST SMEAR REVIEW

## 2021-07-18 NOTE — Telephone Encounter (Signed)
.  Called patient to schedule appointment per 4/17 inbasket, patient is aware of date and time.   ?

## 2021-07-20 ENCOUNTER — Ambulatory Visit: Payer: Medicare HMO | Admitting: Cardiology

## 2021-07-21 ENCOUNTER — Encounter: Payer: Medicare HMO | Admitting: Internal Medicine

## 2021-07-24 ENCOUNTER — Other Ambulatory Visit: Payer: Self-pay | Admitting: Cardiology

## 2021-07-24 ENCOUNTER — Encounter: Payer: Self-pay | Admitting: Cardiology

## 2021-07-24 DIAGNOSIS — I255 Ischemic cardiomyopathy: Secondary | ICD-10-CM

## 2021-07-25 NOTE — Progress Notes (Deleted)
HPI:FU coronary artery disease. Patient previously admitted with abdominal pain. Abdominal CT showed thrombus at the LV apex. Echocardiogram May 2017 showed ejection fraction 35-40% with anterior and apical wall motion abnormality. Grade 2 diastolic dysfunction. Thrombus at the apex. Mild mitral regurgitation and mild left atrial enlargement. Cardiac catheterization May 2017 showed a 90% proximal to mid LAD, 60% ramus and 50% right coronary artery. It was felt that she had likely infarcted the LAD territory and medical therapy recommended unless she has recurrent symptoms. She was placed on apixaban. Readmitted July 2017 and cardiac cath showed a 70% ramus, 50% followed by 55% LAD and occluded right coronary artery. Ejection 30-35%.  Apical thrombus noted. Cardiac MRI July 2017 showed ejection fraction 35%. There was a large apical thrombus. There was mild mitral regurgitation.  Patient seen by Dr. Roxy Manns October 2017. Medical therapy recommended. There has been problems with compliance.  Last echocardiogram May 2020 showed ejection fraction 30 to 35%, moderate left ventricular hypertrophy.  Had ICD implanted June 2020. Since last seen,   Current Outpatient Medications  Medication Sig Dispense Refill   apixaban (ELIQUIS) 5 MG TABS tablet TAKE 1 TABLET TWICE DAILY (LABS NEEDED FOR FURTHER REFILLS) (Patient taking differently: Take 5 mg by mouth 2 (two) times daily.) 28 tablet 0   atorvastatin (LIPITOR) 80 MG tablet TAKE 1 TABLET EVERY DAY (Patient taking differently: Take 80 mg by mouth daily.) 90 tablet 3   ENTRESTO 24-26 MG TAKE 1 TABLET TWICE DAILY (Patient taking differently: 1 tablet 2 (two) times daily.) 180 tablet 1   ezetimibe (ZETIA) 10 MG tablet Take 1 tablet (10 mg total) by mouth daily. 90 tablet 3   ferrous sulfate 325 (65 FE) MG tablet Take 1 tablet (325 mg total) by mouth daily with breakfast. 30 tablet 3   metoprolol succinate (TOPROL-XL) 100 MG 24 hr tablet Take 1 tablet (100 mg  total) by mouth daily. 90 tablet 3   nitroGLYCERIN (NITROSTAT) 0.4 MG SL tablet Place 1 tablet (0.4 mg total) under the tongue every 5 (five) minutes as needed for chest pain. 75 tablet 1   pantoprazole (PROTONIX) 40 MG tablet TAKE 1 TABLET (40 MG TOTAL) BY MOUTH DAILY. 90 tablet 2   vitamin B-12 (CYANOCOBALAMIN) 1000 MCG tablet Take 1 tablet (1,000 mcg total) by mouth daily. 30 tablet 1   No current facility-administered medications for this visit.     Past Medical History:  Diagnosis Date   Anticoagulation adequate 10/14/2015   CAD (coronary artery disease)    Coronary atherosclerosis of native coronary artery, multivessel with plans for CABG to be followed   08/04/2015   Ischemic cardiomyopathy    UTI (urinary tract infection) 10/14/2015    Past Surgical History:  Procedure Laterality Date   CARDIAC CATHETERIZATION N/A 08/04/2015   Procedure: Coronary/Graft Angiography;  Surgeon: Peter M Martinique, MD;  Location: [place] [facility];  Service: Cardiovascular;  Laterality: N/A;   CARDIAC CATHETERIZATION N/A 10/10/2015   Procedure: Left Heart Cath and Coronary Angiography;  Surgeon: Troy Sine, MD;  Location: [place] [facility];  Service: Cardiovascular;  Laterality: N/A;   CESAREAN SECTION     ICD IMPLANT N/A 09/29/2018   Procedure: ICD IMPLANT;  Surgeon: Evans Lance, MD;  Location: [place];  Service: Cardiovascular;  Laterality: N/A;    Social History   Socioeconomic History   Marital status: Divorced    Spouse name: Not on file   Number of children: Not  on file   Years of education: Not on file   Highest education level: Not on file  Occupational History   Not on file  Tobacco Use   Smoking status: Some Days    Packs/day: 0.50    Types: Cigarettes   Smokeless tobacco: Never  Vaping Use   Vaping Use: Never used  Substance and Sexual Activity   Alcohol use: Yes    Alcohol/week: 0.0 standard drinks    Comment: Two drinks a night.   Drug use: No    Comment:  No history of cocaine use   Sexual activity: Not on file  Other Topics Concern   Not on file  Social History Narrative   Not on file   Social Determinants of Health   Financial Resource Strain: Not on file  Food Insecurity: Not on file  Transportation Needs: Not on file  Physical Activity: Not on file  Stress: Not on file  Social Connections: Not on file  Intimate Partner Violence: Not on file    Family History  Problem Relation Age of Onset   Diabetes Mellitus II Neg Hx    CAD Neg Hx     ROS: no fevers or chills, productive cough, hemoptysis, dysphasia, odynophagia, melena, hematochezia, dysuria, hematuria, rash, seizure activity, orthopnea, PND, pedal edema, claudication. Remaining systems are negative.  Physical Exam: Well-developed well-nourished in no acute distress.  Skin is warm and dry.  HEENT is normal.  Neck is supple.  Chest is clear to auscultation with normal expansion.  Cardiovascular exam is regular rate and rhythm.  Abdominal exam nontender or distended. No masses palpated. Extremities show no edema. neuro grossly intact  ECG- personally reviewed  A/P  1 coronary artery disease-patient doing well with no chest pain.  Plan to continue medical therapy.  Patient is not on aspirin given need for anticoagulation.  Continue statin.  2 ischemic cardiomyopathy-continue Entresto and beta-blocker.  Check potassium and renal function.  3 history of left ventricular apical thrombus-we will continue apixaban at present dose.  Check hemoglobin and renal function.  4 previous ICD-managed by electrophysiology.  5 hyperlipidemia-continue statin.  6 tobacco abuse-patient again counseled on discontinuing.  Kirk Ruths, MD

## 2021-07-27 ENCOUNTER — Ambulatory Visit (INDEPENDENT_AMBULATORY_CARE_PROVIDER_SITE_OTHER): Payer: Medicare HMO | Admitting: Internal Medicine

## 2021-07-27 ENCOUNTER — Encounter: Payer: Self-pay | Admitting: Internal Medicine

## 2021-07-27 VITALS — BP 175/102 | HR 98 | Temp 98.2°F | Ht 65.0 in | Wt 164.8 lb

## 2021-07-27 DIAGNOSIS — Z1211 Encounter for screening for malignant neoplasm of colon: Secondary | ICD-10-CM | POA: Insufficient documentation

## 2021-07-27 DIAGNOSIS — E875 Hyperkalemia: Secondary | ICD-10-CM

## 2021-07-27 DIAGNOSIS — Z1239 Encounter for other screening for malignant neoplasm of breast: Secondary | ICD-10-CM | POA: Insufficient documentation

## 2021-07-27 DIAGNOSIS — D509 Iron deficiency anemia, unspecified: Secondary | ICD-10-CM

## 2021-07-27 DIAGNOSIS — I10 Essential (primary) hypertension: Secondary | ICD-10-CM

## 2021-07-27 DIAGNOSIS — E876 Hypokalemia: Secondary | ICD-10-CM | POA: Diagnosis not present

## 2021-07-27 DIAGNOSIS — Z1231 Encounter for screening mammogram for malignant neoplasm of breast: Secondary | ICD-10-CM

## 2021-07-27 NOTE — Assessment & Plan Note (Signed)
Patient had low magnesium in the hospital. Not interested in stopping PPI today, but discussed possible side effect of PPI.  ? ?Plan: ?-Check Mg ?

## 2021-07-27 NOTE — Assessment & Plan Note (Signed)
Patient discharged one week ago from hospital with stable microcytic anemia. Reviewed workup and consistent with mild iron deficiency anemia and patient also has low B12. Started on b12 and iron supplementation. Has follow up in 1 month with hematologist who saw her in the hospital Will hold off on recheck of labs today as would not change management. She is asymptomatic and no blood loss on review of systems.   ?

## 2021-07-27 NOTE — Patient Instructions (Signed)
Thank you for trusting me with your care. To recap, today we discussed the following: ? ?Referral placed to gastroenterology for screening colonoscopy. ?Referral placed to breast center for screening mammogram ?Please schedule follow-up for Pap smear at checkout. ? ?Labs: ?Your magnesium and potassium was low during the hospital stay.  We will check labs today ? ? ?BP Readings from Last 3 Encounters:  ?07/27/21 (!) 171/85  ?07/17/21 115/71  ?07/09/21 128/82  ? ?Your blood pressure was elevated during office visit today.  Previously her blood pressure was normal.  ?

## 2021-07-27 NOTE — Assessment & Plan Note (Signed)
BP Readings from Last 3 Encounters: ?07/27/21 (!) 175/102 ?07/17/21 115/71 ?07/09/21 128/82 ? ? ?Blood pressure is elevated today.  Review of chart shows patient has been normotensive on antihypertensive regimen.  At hospital discharge no antihypertensives were stopped and patient says she has been taking ?Toprolol-XL 100 mg daily and Entresto 50 mg daily.  Sleep study ordered by cardiology last year, but doesn't appear to be completed. Blood pressure remained elevated on recheck.  Patient appears anxious and recently stopped drinking alcohol. She denies any history of withdrawal ,but discussed this as possibility. She reports not drinking since hospital stay and I expect she is out of acute withdrawal window. Will have patient follow up in 2 weeks for Pap Smear and blood pressure check. If elevated at this time will discuss further treatment.  ? ?Plan: ?Continue Toprolo-Xl 100 mg daily ?Continue Entresto 50 mg daily  ?- follow up in 2 weeks ? ? ?

## 2021-07-27 NOTE — Assessment & Plan Note (Signed)
Age 51.  No family history of colon cancer. ? ?Plan: ?- Ambulatory referral to Gastroenterology for screening colonoscopy ? ? ? ?

## 2021-07-27 NOTE — Assessment & Plan Note (Signed)
Hypokalemia on recent hospital admission.Also with hypomagnesia in the hospital.  Patient is on PPI, reports for GERD. Discussed we could taper and start H2 blocker. She would like to continue for now and discuss with Dr.Crenshaw.  ? ?Plan ? ?-BMP ?

## 2021-07-27 NOTE — Assessment & Plan Note (Signed)
Patient age 51.  No family history of breast cancer. ? ?Plan: ?- Referral for screening mammogram ?

## 2021-07-27 NOTE — Progress Notes (Signed)
? ?CC: hospital follow for anemia, establish care in clinic ? ?HPI:Casey [name] is a 51 y.o. female who presents for hospital follow-up for anemia and thrombocytopenia, establish care in clinic, and high blood pressure.  Patient received admitted to [facility] on [facility] hospitalist service for hallucinations.  She was diagnosed with anemia and thrombocytopenia.  Hematology, Dr. Lindi Adie consulted.  Lab work showed mild iron deficiency anemia and B12 deficiency.  Patient was asymptomatic in regards to anemia and thrombocytopenia.  Patient's daughter is with her today.  She is concerned patient drinks alcohol daily if she has alcohol. Patient drinks liquor and beer. Her daughter is concerned patient drinks because she is depressed, but screening for depression is negative.    She stopped drinking 3 to 4 days before hospital admission. She has not started drinking again. She is hypertensive and normotensive when in the hospital. Reports taking antihypertensives. She appears anxious , and discussed symptoms of withdrawal.  She is not interested in naltrexone or counseling at this time. Please see individual problem based A/P for details. ? ?Current Outpatient Medications on File Prior to Visit  ?Medication Sig Dispense Refill  ? apixaban (ELIQUIS) 5 MG TABS tablet TAKE 1 TABLET TWICE DAILY (LABS NEEDED FOR FURTHER REFILLS) (Patient taking differently: Take 5 mg by mouth 2 (two) times daily.) 28 tablet 0  ? atorvastatin (LIPITOR) 80 MG tablet TAKE 1 TABLET EVERY DAY (Patient taking differently: Take 80 mg by mouth daily.) 90 tablet 3  ? ENTRESTO 24-26 MG TAKE 1 TABLET TWICE DAILY 180 tablet 1  ? ezetimibe (ZETIA) 10 MG tablet Take 1 tablet (10 mg total) by mouth daily. 90 tablet 3  ? ferrous sulfate 325 (65 FE) MG tablet Take 1 tablet (325 mg total) by mouth daily with breakfast. 30 tablet 3  ? metoprolol succinate (TOPROL-XL) 100 MG 24 hr tablet Take 1 tablet (100 mg total) by mouth daily. 90 tablet 3  ?  nitroGLYCERIN (NITROSTAT) 0.4 MG SL tablet Place 1 tablet (0.4 mg total) under the tongue every 5 (five) minutes as needed for chest pain. 75 tablet 1  ? pantoprazole (PROTONIX) 40 MG tablet TAKE 1 TABLET (40 MG TOTAL) BY MOUTH DAILY. 90 tablet 2  ? vitamin B-12 (CYANOCOBALAMIN) 1000 MCG tablet Take 1 tablet (1,000 mcg total) by mouth daily. 30 tablet 1  ? ?No current facility-administered medications on file prior to visit.  ? ?Allergies  ?Allergen Reactions  ? Bempedoic Acid Other (See Comments)  ?  hallucinations  ? ?Past Medical History:  ?Diagnosis Date  ? Anticoagulation adequate 10/14/2015  ? CAD (coronary artery disease)   ? Coronary atherosclerosis of native coronary artery, multivessel with plans for CABG to be followed   08/04/2015  ? Ischemic cardiomyopathy   ? UTI (urinary tract infection) 10/14/2015  ? ?Past Surgical History:  ?Procedure Laterality Date  ? CARDIAC CATHETERIZATION N/A 08/04/2015  ? Procedure: Coronary/Graft Angiography;  Surgeon: Peter M Martinique, MD;  Location: [facility];  Service: Cardiovascular;  Laterality: N/A;  ? CARDIAC CATHETERIZATION N/A 10/10/2015  ? Procedure: Left Heart Cath and Coronary Angiography;  Surgeon: Troy Sine, MD;  Location: [facility];  Service: Cardiovascular;  Laterality: N/A;  ? CESAREAN SECTION    ? ICD IMPLANT N/A 09/29/2018  ? Procedure: ICD IMPLANT;  Surgeon: Evans Lance, MD;  Location: [facility];  Service: Cardiovascular;  Laterality: N/A;  ? ?Family History  ?Problem Relation Age of Onset  ? Diabetes Mellitus  II Neg Hx   ? CAD Neg Hx   ? ? ? ?Social History  ? ?Tobacco Use  ? Smoking status: Some Days  ?  Packs/day: 0.50  ?  Years: 25.00  ?  Pack years: 12.50  ?  Types: Cigarettes  ? Smokeless tobacco: Never  ?Vaping Use  ? Vaping Use: Never used  ?Substance Use Topics  ? Alcohol use: Yes  ?  Alcohol/week: 0.0 standard drinks  ?  Comment: Two drinks a night.  ? Drug use: No  ?  Comment: No history of cocaine use  ? ? ? ?Review of  Systems:   ?Review of Systems  ?Constitutional:  Negative for chills and fever.  ?Respiratory:  Negative for cough and shortness of breath.   ?Cardiovascular:  Negative for chest pain and leg swelling.  ?Gastrointestinal:  Negative for abdominal pain, blood in stool and melena.  ?Genitourinary:  Negative for flank pain and hematuria.  ?Neurological:  Negative for dizziness and loss of consciousness.  ?Psychiatric/Behavioral:  Negative for depression and substance abuse.    ? ?Physical Exam: ?Vitals:  ? 07/27/21 1328 07/27/21 1430  ?BP: (!) 171/85 (!) 175/102  ?Pulse: 99 98  ?Temp: 98.2 ?F (36.8 ?C)   ?TempSrc: Oral   ?SpO2: 100%   ?Weight: 164 lb 12.8 oz (74.8 kg)   ?Height: 5\' 5"  (1.651 m)   ? ?General appearance: NAD, well developed,daughter accompanies to visit and adds to history. ?Eyes: anicteric sclerae, moist conjunctivae; ?HENT: MMM, no mucosal ulcerations; normal hard and soft palate ?Neck: Trachea midline; no lymphadenopathy, no JVD ?Lungs: CTAB, no crackles, no wheeze, with normal respiratory effort and no intercostal retractions ?CV: RRR, S1, S2, no MRGs  ?Abdomen: Soft, non-tender; non-distended, BS present  ?Extremities: No peripheral edema, varicose vein on left and right leg ?Skin: Normal temperature, turgor and texture; no rash ?Psych: Appropriate affect, appears anxious ?Neuro: Alert and oriented to person and place, no focal deficit  ? ? ? ? ?Assessment & Plan:  ? ?Microcytic anemia ?Patient discharged one week ago from hospital with stable microcytic anemia. Reviewed workup and consistent with mild iron deficiency anemia and patient also has low B12. Started on b12 and iron supplementation. Has follow up in 1 month with hematologist who saw her in the hospital Will hold off on recheck of labs today as would not change management. She is asymptomatic and no blood loss on review of systems.   ? ?Hypokalemia ?Hypokalemia on recent hospital admission.Also with hypomagnesia in the hospital.  Patient  is on PPI, reports for GERD. Discussed we could taper and start H2 blocker. She would like to continue for now and discuss with Dr.Crenshaw.  ? ?Plan ? ?-BMP ? ?Hypomagnesemia ?Patient had low magnesium in the hospital. Not interested in stopping PPI today, but discussed possible side effect of PPI.  ? ?Plan: ?-Check Mg ? ?Screening for colon cancer ?Age 29.  No family history of colon cancer. ? ?Plan: ?- Ambulatory referral to Gastroenterology for screening colonoscopy ? ? ? ? ?Breast cancer screening ?Patient age 68.  No family history of breast cancer. ? ?Plan: ?- Referral for screening mammogram ? ?Hypertension ?BP Readings from Last 3 Encounters: ?07/27/21 (!) 175/102 ?07/17/21 115/71 ?07/09/21 128/82 ? ? ?Blood pressure is elevated today.  Review of chart shows patient has been normotensive on antihypertensive regimen.  At hospital discharge no antihypertensives were stopped and patient says she has been taking ?Toprolol-XL 100 mg daily and Entresto 50 mg daily.  Sleep study ordered  by cardiology last year, but doesn't appear to be completed. Blood pressure remained elevated on recheck.  Patient appears anxious and recently stopped drinking alcohol. She denies any history of withdrawal ,but discussed this as possibility. She reports not drinking since hospital stay and I expect she is out of acute withdrawal window. Will have patient follow up in 2 weeks for Pap Smear and blood pressure check. If elevated at this time will discuss further treatment.  ? ?Plan: ?Continue Toprolo-Xl 100 mg daily ?Continue Entresto 50 mg daily  ?- follow up in 2 weeks ? ? ? ? ?Patient discussed with Dr. Saverio Danker ? ? ?

## 2021-07-28 LAB — BMP8+ANION GAP
Anion Gap: 18 mmol/L (ref 10.0–18.0)
BUN/Creatinine Ratio: 10 (ref 9–23)
BUN: 6 mg/dL (ref 6–24)
CO2: 22 mmol/L (ref 20–29)
Calcium: 9.6 mg/dL (ref 8.7–10.2)
Chloride: 99 mmol/L (ref 96–106)
Creatinine, Ser: 0.6 mg/dL (ref 0.57–1.00)
Glucose: 100 mg/dL — ABNORMAL HIGH (ref 70–99)
Potassium: 4.9 mmol/L (ref 3.5–5.2)
Sodium: 139 mmol/L (ref 134–144)
eGFR: 109 mL/min/{1.73_m2} (ref >59)

## 2021-07-28 LAB — MAGNESIUM: Magnesium: 1.3 mg/dL — ABNORMAL LOW (ref 1.6–2.3)

## 2021-07-28 NOTE — Progress Notes (Signed)
Internal Medicine Clinic Attending  Case discussed with Dr. Steen  At the time of the visit.  We reviewed the resident's history and exam and pertinent patient test results.  I agree with the assessment, diagnosis, and plan of care documented in the resident's note.  

## 2021-07-31 ENCOUNTER — Telehealth: Payer: Self-pay | Admitting: Internal Medicine

## 2021-07-31 NOTE — Telephone Encounter (Signed)
Reviewed lab work with patient. Magnesium is low. It was low in the hospital and I believe this could be from PPI. We could taper and switch to H2 blocker if needed. Patient would like to discuss with cardiology. She has appointment on 5/9.  ?

## 2021-08-01 NOTE — Progress Notes (Signed)
? ?Patient Care Team: ?Patient, No Pcp Per (Inactive) as PCP - General (General Practice) ?Lewayne Bunting, MD as PCP - Cardiology (Cardiology) ? ?DIAGNOSIS:  ?Encounter Diagnosis  ?Name Primary?  ? Microcytic anemia   ? ?  ? ?CHIEF COMPLIANT:  Anemia and thrombocytopenia ? ?INTERVAL HISTORY: Casey [name] is a 51 year old female the past medical history significant for CAD, ischemic cardiomyopathy, CHF, cardiac thrombus, GERD. She presents to the clinic today for a follow-up to check on her anemia and thrombocytopenia.  During the hospitalization she became very anemic with a hemoglobin dropping down to 8 g.  Extensive work-up was performed which did not reveal any specific reason.  We felt that it was anemia of acute illness.  She is here 1 month after getting discharged to recheck her labs.  She tells me that she is feeling significantly better. ? ? ?ALLERGIES:  is allergic to bempedoic acid. ? ?MEDICATIONS:  ?Current Outpatient Medications  ?Medication Sig Dispense Refill  ? apixaban (ELIQUIS) 5 MG TABS tablet TAKE 1 TABLET TWICE DAILY (LABS NEEDED FOR FURTHER REFILLS) (Patient taking differently: Take 5 mg by mouth 2 (two) times daily.) 28 tablet 0  ? atorvastatin (LIPITOR) 80 MG tablet TAKE 1 TABLET EVERY DAY (Patient taking differently: Take 80 mg by mouth daily.) 90 tablet 3  ? ENTRESTO 24-26 MG TAKE 1 TABLET TWICE DAILY 180 tablet 1  ? ezetimibe (ZETIA) 10 MG tablet Take 1 tablet (10 mg total) by mouth daily. 90 tablet 3  ? ferrous sulfate 325 (65 FE) MG tablet Take 1 tablet (325 mg total) by mouth daily with breakfast. 30 tablet 3  ? metoprolol succinate (TOPROL-XL) 100 MG 24 hr tablet Take 1 tablet (100 mg total) by mouth daily. 90 tablet 3  ? nitroGLYCERIN (NITROSTAT) 0.4 MG SL tablet Place 1 tablet (0.4 mg total) under the tongue every 5 (five) minutes as needed for chest pain. 75 tablet 1  ? pantoprazole (PROTONIX) 40 MG tablet TAKE 1 TABLET (40 MG TOTAL) BY MOUTH DAILY. 90 tablet 2  ? vitamin  B-12 (CYANOCOBALAMIN) 1000 MCG tablet Take 1 tablet (1,000 mcg total) by mouth daily. 30 tablet 1  ? ?No current facility-administered medications for this visit.  ? ? ?PHYSICAL EXAMINATION: ?ECOG PERFORMANCE STATUS: 1 - Symptomatic but completely ambulatory ? ?Vitals:  ? 08/15/21 1354  ?BP: 125/75  ?Pulse: (!) 104  ?Resp: 19  ?Temp: 97.8 ?F (36.6 ?C)  ?SpO2: 100%  ? ?Filed Weights  ? 08/15/21 1354  ?Weight: 155 lb (70.3 kg)  ? ?  ? ?LABORATORY DATA:  ?I have reviewed the data as listed ? ?  Latest Ref Rng & Units 07/27/2021  ?  2:33 PM 07/17/2021  ?  3:05 AM 07/16/2021  ?  6:23 PM  ?CMP  ?Glucose 70 - 99 mg/dL 008   676     ?BUN 6 - 24 mg/dL 6   12     ?Creatinine 0.57 - 1.00 mg/dL 1.95   0.93     ?Sodium 134 - 144 mmol/L 139   137     ?Potassium 3.5 - 5.2 mmol/L 4.9   3.5   3.6    ?Chloride 96 - 106 mmol/L 99   104     ?CO2 20 - 29 mmol/L 22   25     ?Calcium 8.7 - 10.2 mg/dL 9.6   9.5     ? ? ?Lab Results  ?Component Value Date  ? WBC 5.5 08/15/2021  ? HGB 10.3 (  L) 08/15/2021  ? HCT 30.8 (L) 08/15/2021  ? MCV 85.6 08/15/2021  ? PLT 222 08/15/2021  ? NEUTROABS 1.5 (L) 08/15/2021  ? ? ?ASSESSMENT & PLAN:  ?Microcytic anemia ?Lab review: ?12/22/2020: Hemoglobin 10.2, platelets 297 ?07/17/2021: Hemoglobin 8, platelets 115 ?Hospitalization 07/15/2021-07/17/2021: Hallucinations could be medication induced ?Work-up for anemia: LDH 290, TSH 0.9, B12 245, folate 9.1 ? ?Cause of anemia felt to be related to medications versus infection. ?08/15/2021: WBC 5.5, hemoglobin 10.3, MCV 85.6, ANC 1.5, platelets 222 ?I discussed with her that the platelet count has normalized and the hemoglobin is significantly better. ?I expect to continue to improve and recover her counts. ?There is no role of any additional procedures or bone marrow biopsies at this time. ? ?Return to clinic to see me on an as-needed basis. ? ?No orders of the defined types were placed in this encounter. ? ?The patient has a good understanding of the overall plan. she  agrees with it. she will call with any problems that may develop before the next visit here. ?Total time spent: 30 mins including face to face time and time spent for planning, charting and co-ordination of care ? ? Tamsen Meek, MD ?08/15/21 ? ? ? I Janan Ridge am scribing for Dr. Pamelia Hoit ? ?I have reviewed the above documentation for accuracy and completeness, and I agree with the above. ?  ?

## 2021-08-08 ENCOUNTER — Ambulatory Visit: Payer: Medicare HMO

## 2021-08-08 ENCOUNTER — Ambulatory Visit: Payer: Medicare HMO | Admitting: Cardiology

## 2021-08-10 ENCOUNTER — Inpatient Hospital Stay: Admission: RE | Admit: 2021-08-10 | Payer: Medicare HMO | Source: Ambulatory Visit

## 2021-08-14 ENCOUNTER — Other Ambulatory Visit: Payer: Self-pay | Admitting: *Deleted

## 2021-08-14 DIAGNOSIS — D696 Thrombocytopenia, unspecified: Secondary | ICD-10-CM

## 2021-08-15 ENCOUNTER — Other Ambulatory Visit: Payer: Self-pay

## 2021-08-15 ENCOUNTER — Inpatient Hospital Stay: Payer: Medicare HMO | Attending: Hematology and Oncology | Admitting: Hematology and Oncology

## 2021-08-15 ENCOUNTER — Inpatient Hospital Stay: Payer: Medicare HMO

## 2021-08-15 DIAGNOSIS — D509 Iron deficiency anemia, unspecified: Secondary | ICD-10-CM | POA: Diagnosis not present

## 2021-08-15 DIAGNOSIS — I2510 Atherosclerotic heart disease of native coronary artery without angina pectoris: Secondary | ICD-10-CM | POA: Insufficient documentation

## 2021-08-15 DIAGNOSIS — K219 Gastro-esophageal reflux disease without esophagitis: Secondary | ICD-10-CM | POA: Diagnosis not present

## 2021-08-15 DIAGNOSIS — I509 Heart failure, unspecified: Secondary | ICD-10-CM | POA: Diagnosis not present

## 2021-08-15 DIAGNOSIS — D696 Thrombocytopenia, unspecified: Secondary | ICD-10-CM

## 2021-08-15 LAB — CBC WITH DIFFERENTIAL (CANCER CENTER ONLY)
Abs Immature Granulocytes: 0 10*3/uL (ref 0.00–0.07)
Basophils Absolute: 0 10*3/uL (ref 0.0–0.1)
Basophils Relative: 1 %
Eosinophils Absolute: 0.1 10*3/uL (ref 0.0–0.5)
Eosinophils Relative: 1 %
HCT: 30.8 % — ABNORMAL LOW (ref 36.0–46.0)
Hemoglobin: 10.3 g/dL — ABNORMAL LOW (ref 12.0–15.0)
Immature Granulocytes: 0 %
Lymphocytes Relative: 64 %
Lymphs Abs: 3.6 10*3/uL (ref 0.7–4.0)
MCH: 28.6 pg (ref 26.0–34.0)
MCHC: 33.4 g/dL (ref 30.0–36.0)
MCV: 85.6 fL (ref 80.0–100.0)
Monocytes Absolute: 0.4 10*3/uL (ref 0.1–1.0)
Monocytes Relative: 7 %
Neutro Abs: 1.5 10*3/uL — ABNORMAL LOW (ref 1.7–7.7)
Neutrophils Relative %: 27 %
Platelet Count: 222 10*3/uL (ref 150–400)
RBC: 3.6 MIL/uL — ABNORMAL LOW (ref 3.87–5.11)
RDW: 22 % — ABNORMAL HIGH (ref 11.5–15.5)
WBC Count: 5.5 10*3/uL (ref 4.0–10.5)
nRBC: 0 % (ref 0.0–0.2)

## 2021-08-15 NOTE — Assessment & Plan Note (Signed)
Lab review: ?12/22/2020: Hemoglobin 10.2, platelets 297 ?07/17/2021: Hemoglobin 8, platelets 115 ?Hospitalization 07/15/2021-07/17/2021: Hallucinations could be medication induced ?Work-up for anemia: LDH 290, TSH 0.9, B12 245, folate 9.1 ? ?Cause of anemia felt to be related to medications versus infection. ?08/15/2021: Labs: ?

## 2021-08-24 ENCOUNTER — Encounter: Payer: Self-pay | Admitting: Nurse Practitioner

## 2021-08-24 ENCOUNTER — Ambulatory Visit (INDEPENDENT_AMBULATORY_CARE_PROVIDER_SITE_OTHER): Payer: Medicare HMO | Admitting: Nurse Practitioner

## 2021-08-24 VITALS — BP 132/84 | HR 82 | Ht 65.0 in | Wt 164.6 lb

## 2021-08-24 DIAGNOSIS — I2510 Atherosclerotic heart disease of native coronary artery without angina pectoris: Secondary | ICD-10-CM | POA: Diagnosis not present

## 2021-08-24 DIAGNOSIS — E785 Hyperlipidemia, unspecified: Secondary | ICD-10-CM

## 2021-08-24 DIAGNOSIS — I513 Intracardiac thrombosis, not elsewhere classified: Secondary | ICD-10-CM | POA: Diagnosis not present

## 2021-08-24 DIAGNOSIS — D649 Anemia, unspecified: Secondary | ICD-10-CM

## 2021-08-24 DIAGNOSIS — I10 Essential (primary) hypertension: Secondary | ICD-10-CM

## 2021-08-24 DIAGNOSIS — I255 Ischemic cardiomyopathy: Secondary | ICD-10-CM

## 2021-08-24 DIAGNOSIS — D696 Thrombocytopenia, unspecified: Secondary | ICD-10-CM

## 2021-08-24 DIAGNOSIS — Z720 Tobacco use: Secondary | ICD-10-CM

## 2021-08-24 NOTE — Patient Instructions (Signed)
Medication Instructions:  ?Your physician recommends that you continue on your current medications as directed. Please refer to the Current Medication list given to you today.  ? ? ?*If you need a refill on your cardiac medications before your next appointment, please call your pharmacy* ? ? ?Lab Work: ?NONE ordered at this time of appointment  ? ? ?If you have labs (blood work) drawn today and your tests are completely normal, you will receive your results only by: ?MyChart Message (if you have MyChart) OR ?A paper copy in the mail ?If you have any lab test that is abnormal or we need to change your treatment, we will call you to review the results. ? ? ?Testing/Procedures: ?NONE ordered at this time of appointment  ? ? ? ?Follow-Up: ?At [facility], you and your health needs are our priority.  As part of our continuing mission to provide you with exceptional heart care, we have created designated Provider Care Teams.  These Care Teams include your primary Cardiologist (physician) and Advanced Practice Providers (APPs -  Physician Assistants and Nurse Practitioners) who all work together to provide you with the care you need, when you need it. ? ?We recommend signing up for the patient portal called "MyChart".  Sign up information is provided on this After Visit Summary.  MyChart is used to connect with patients for Virtual Visits (Telemedicine).  Patients are able to view lab/test results, encounter notes, upcoming appointments, etc.  Non-urgent messages can be sent to your provider as well.   ?To learn more about what you can do with MyChart, go to [email or web address].   ? ?Your next appointment:   ? 6 months ? ?The format for your next appointment:   ?In Person ? ?Provider:   ?Brian Crenshaw, MD   ? ? ?Other Instructions ? ? ?Important Information About Sugar ? ? ? ? ? ? ?

## 2021-08-24 NOTE — Progress Notes (Signed)
Office Visit    Patient Name: Casey [name] Date of Encounter: 08/24/2021  Primary Care Provider:  Patient, No Pcp Per (Inactive) Primary Cardiologist:  Olga Millers, MD  Chief Complaint    51 year old female with a history of CAD, LV apical thrombus, ICM, ICD, hyperlipidemia, and tobacco use who presents for follow-up related to CAD.  Past Medical History    Past Medical History:  Diagnosis Date   Anticoagulation adequate 10/14/2015   CAD (coronary artery disease)    Coronary atherosclerosis of native coronary artery, multivessel with plans for CABG to be followed   08/04/2015   Ischemic cardiomyopathy    UTI (urinary tract infection) 10/14/2015   Past Surgical History:  Procedure Laterality Date   CARDIAC CATHETERIZATION N/A 08/04/2015   Procedure: Coronary/Graft Angiography;  Surgeon: Peter M Swaziland, MD;  Location: [facility] INVASIVE CV LAB;  Service: Cardiovascular;  Laterality: N/A;   CARDIAC CATHETERIZATION N/A 10/10/2015   Procedure: Left Heart Cath and Coronary Angiography;  Surgeon: Lennette Bihari, MD;  Location: [facility] INVASIVE CV LAB;  Service: Cardiovascular;  Laterality: N/A;   CESAREAN SECTION     ICD IMPLANT N/A 09/29/2018   Procedure: ICD IMPLANT;  Surgeon: Marinus Maw, MD;  Location: [facility] INVASIVE CV LAB;  Service: Cardiovascular;  Laterality: N/A;    Allergies  Allergies  Allergen Reactions   Bempedoic Acid Other (See Comments)    hallucinations    History of Present Illness    51 year old female with the above past medical history including CAD, LV apical thrombus, ICM, ICD, hyperlipidemia, and tobacco use.  She was hospitalized in 2017 in the setting of abdominal pain. CT abdomen/pelvis showed thrombus at the LV apex.  Follow-up echocardiogram in May 2017 showed EF 35 to 40% with anterior and apical wall motion abnormality, grade 2 diastolic dysfunction, thrombus at the apex, mild mitral valve regurgitation, and mild left atrial enlargement.  Cardiac  catheterization at the time showed 90% p-mLAD, 60% Ramus, and 50% RCA stenoses.  It was felt that she had likely infarcted the LAD territory and medical therapy was recommended unless she had recurrent symptoms.  She was started on Eliquis. She was readmitted to the hospital in July 2017, repeat cardiac catheterization showed 70% Ramus, 50% followed by 55% LAD and occluded RCA, EF 30-35%.  Apical thrombus was again noted.  Cardiac MRI in July 2017 showed EF 35%, large apical thrombus, mild mitral valve regurgitation. She was evaluated by Dr. Cornelius Moras in October 2017, medical therapy was recommended.  She has had issues with adherence to medications. Repeat echocardiogram in May 2020 showed EF 30 to 35%, mild LVH, she ultimately underwent ICD implantation in June 2020. She was last seen in the office on 12/22/2020 and reported some fatigue, however, she denied dyspnea, denied symptoms concerning for angina.   She presents today for follow-up. Since her last visit has done well from a cardiac standpoint.  She denies any symptoms concerning for angina, denies dyspnea, edema, PND, orthopnea.  Overall, she reports feeling well and denies any new concerns today.  Home Medications    Current Outpatient Medications  Medication Sig Dispense Refill   apixaban (ELIQUIS) 5 MG TABS tablet TAKE 1 TABLET TWICE DAILY (LABS NEEDED FOR FURTHER REFILLS) (Patient taking differently: Take 5 mg by mouth 2 (two) times daily.) 28 tablet 0   atorvastatin (LIPITOR) 80 MG tablet TAKE 1 TABLET EVERY DAY (Patient taking differently: Take 80 mg by mouth daily.) 90 tablet 3   ENTRESTO 24-26  MG TAKE 1 TABLET TWICE DAILY 180 tablet 1   ezetimibe (ZETIA) 10 MG tablet Take 1 tablet (10 mg total) by mouth daily. 90 tablet 3   ferrous sulfate 325 (65 FE) MG tablet Take 1 tablet (325 mg total) by mouth daily with breakfast. 30 tablet 3   metoprolol succinate (TOPROL-XL) 100 MG 24 hr tablet Take 1 tablet (100 mg total) by mouth daily. 90 tablet 3    nitroGLYCERIN (NITROSTAT) 0.4 MG SL tablet Place 1 tablet (0.4 mg total) under the tongue every 5 (five) minutes as needed for chest pain. 75 tablet 1   pantoprazole (PROTONIX) 40 MG tablet TAKE 1 TABLET (40 MG TOTAL) BY MOUTH DAILY. 90 tablet 2   vitamin B-12 (CYANOCOBALAMIN) 1000 MCG tablet Take 1 tablet (1,000 mcg total) by mouth daily. 30 tablet 1   No current facility-administered medications for this visit.     Review of Systems    She denies chest pain, palpitations, dyspnea, pnd, orthopnea, n, v, dizziness, syncope, edema, weight gain, or early satiety. All other systems reviewed and are otherwise negative except as noted above.   Physical Exam    VS:  BP 132/84   Pulse 82   Ht 5\' 5"  (1.651 m)   Wt 164 lb 9.6 oz (74.7 kg)   LMP 09/29/2018 (Exact Date)   SpO2 99%   BMI 27.39 kg/m  GEN: Well nourished, well developed, in no acute distress. HEENT: normal. Neck: Supple, no JVD, carotid bruits, or masses. Cardiac: RRR, no murmurs, rubs, or gallops. No clubbing, cyanosis, edema.  Radials/DP/PT 2+ and equal bilaterally.  Respiratory:  Respirations regular and unlabored, clear to auscultation bilaterally. GI: Soft, nontender, nondistended, BS + x 4. MS: no deformity or atrophy. Skin: warm and dry, no rash. Neuro:  Strength and sensation are intact. Psych: Normal affect.  Accessory Clinical Findings    ECG personally reviewed by me today - No EKG in office today.  Lab Results  Component Value Date   WBC 5.5 08/15/2021   HGB 10.3 (L) 08/15/2021   HCT 30.8 (L) 08/15/2021   MCV 85.6 08/15/2021   PLT 222 08/15/2021   Lab Results  Component Value Date   CREATININE 0.60 07/27/2021   BUN 6 07/27/2021   NA 139 07/27/2021   K 4.9 07/27/2021   CL 99 07/27/2021   CO2 22 07/27/2021   Lab Results  Component Value Date   ALT 34 07/15/2021   AST 73 (H) 07/15/2021   ALKPHOS 51 07/15/2021   BILITOT 0.4 07/15/2021   Lab Results  Component Value Date   CHOL 134 06/01/2021    HDL 45 06/01/2021   LDLCALC 72 06/01/2021   TRIG 88 06/01/2021   CHOLHDL 3.0 06/01/2021    Lab Results  Component Value Date   HGBA1C 4.9 10/14/2015    Assessment & Plan   1. CAD: Cath in July 2017 showed 70% Ramus, 50% followed by 55% LAD, and occluded RCA, EF 30-35%. Stable with no anginal symptoms. No indication for ischemic evaluation.  Continue metoprolol, Entresto, Lipitor, and Zetia.  2. ICM: Most recent echo in May 2020 showed EF 30 to 35%, mild LVH, she ultimately underwent ICD implantation in June 2020. Euvolemic and well compensated on exam.  Continue metoprolol, Entresto.  3. Left ventricular apical thrombus: Cardiac MRI in July 2017 showed EF 35%, large apical thrombus, mild mitral valve regurgitation. She was evaluated by Dr. Cornelius Moras in October 2017, medical therapy was recommended.  Continue Eliquis.  4. Hypertension:  BP borderline in office today.  Continue to monitor BP, if BP remains elevated greater than 130/80, consider escalation of Entresto.  5. Hyperlipidemia: LDL was 72 in March 2023.  She did not tolerate Nexlizet.  Continue Lipitor, Zetia.  6. Anemia/thrombocytopenia: Hemoglobin was 8.0 in April 2023. Following with hematology/oncology.  7. Tobacco use: Full cessation advised.  8. Disposition: Follow-up in 6 months.   Joylene Grapes, NP 08/24/2021, 4:30 PM

## 2021-09-20 ENCOUNTER — Other Ambulatory Visit: Payer: Self-pay

## 2021-09-20 MED ORDER — EZETIMIBE 10 MG PO TABS: 10.0000 mg | ORAL_TABLET | Freq: Every day | ORAL | 3 refills | Status: DC

## 2021-09-25 ENCOUNTER — Ambulatory Visit (INDEPENDENT_AMBULATORY_CARE_PROVIDER_SITE_OTHER): Payer: Medicare HMO

## 2021-09-25 DIAGNOSIS — I255 Ischemic cardiomyopathy: Secondary | ICD-10-CM | POA: Diagnosis not present

## 2021-09-25 LAB — CUP PACEART REMOTE DEVICE CHECK
Battery Remaining Longevity: 156 mo
Battery Remaining Percentage: 100 %
Brady Statistic RV Percent Paced: 0 %
Date Time Interrogation Session: 20230626002100
HighPow Impedance: 62 Ohm
Implantable Lead Implant Date: 20200629
Implantable Lead Location: 753860
Implantable Lead Model: 292
Implantable Lead Serial Number: 448526
Implantable Pulse Generator Implant Date: 20200629
Lead Channel Impedance Value: 514 Ohm
Lead Channel Pacing Threshold Amplitude: 0.9 V
Lead Channel Pacing Threshold Pulse Width: 0.4 ms
Lead Channel Setting Pacing Amplitude: 2 V
Lead Channel Setting Pacing Pulse Width: 0.4 ms
Lead Channel Setting Sensing Sensitivity: 0.6 mV
Pulse Gen Serial Number: 262129

## 2021-10-02 ENCOUNTER — Other Ambulatory Visit: Payer: Self-pay | Admitting: Cardiology

## 2021-10-02 DIAGNOSIS — I255 Ischemic cardiomyopathy: Secondary | ICD-10-CM

## 2021-10-02 DIAGNOSIS — I739 Peripheral vascular disease, unspecified: Secondary | ICD-10-CM

## 2021-10-19 NOTE — Progress Notes (Signed)
Remote ICD transmission.   

## 2021-12-25 ENCOUNTER — Ambulatory Visit (INDEPENDENT_AMBULATORY_CARE_PROVIDER_SITE_OTHER): Payer: Medicare HMO

## 2021-12-25 DIAGNOSIS — I255 Ischemic cardiomyopathy: Secondary | ICD-10-CM

## 2021-12-26 LAB — CUP PACEART REMOTE DEVICE CHECK
Battery Remaining Longevity: 156 mo
Battery Remaining Percentage: 100 %
Brady Statistic RV Percent Paced: 0 %
Date Time Interrogation Session: 20230925004500
HighPow Impedance: 69 Ohm
Implantable Lead Implant Date: 20200629
Implantable Lead Location: 753860
Implantable Lead Model: 292
Implantable Lead Serial Number: 448526
Implantable Pulse Generator Implant Date: 20200629
Lead Channel Impedance Value: 502 Ohm
Lead Channel Pacing Threshold Amplitude: 1 V
Lead Channel Pacing Threshold Pulse Width: 0.4 ms
Lead Channel Setting Pacing Amplitude: 2.2 V
Lead Channel Setting Pacing Pulse Width: 0.4 ms
Lead Channel Setting Sensing Sensitivity: 0.6 mV
Pulse Gen Serial Number: 262129

## 2022-01-03 NOTE — Progress Notes (Signed)
Remote ICD transmission.   

## 2022-02-09 ENCOUNTER — Other Ambulatory Visit: Payer: Self-pay | Admitting: Cardiology

## 2022-02-19 ENCOUNTER — Other Ambulatory Visit: Payer: Self-pay | Admitting: Cardiology

## 2022-02-19 DIAGNOSIS — I513 Intracardiac thrombosis, not elsewhere classified: Secondary | ICD-10-CM

## 2022-02-19 NOTE — Telephone Encounter (Signed)
Prescription refill request for Eliquis received. Indication: Left ventricular apical thrombus Last office visit: 08/24/21 ([place]) Scr: 0.60 (07/27/21) Age: 51 Weight: 74.7kg  Appropriate dose and refill sent to requested pharmacy.

## 2022-02-26 NOTE — Progress Notes (Deleted)
HPI: FU coronary artery disease. Patient previously admitted with abdominal pain. Abdominal CT showed thrombus at the LV apex. Echocardiogram May 2017 showed ejection fraction 35-40% with anterior and apical wall motion abnormality. Grade 2 diastolic dysfunction. Thrombus at the apex. Mild mitral regurgitation and mild left atrial enlargement. Cardiac catheterization May 2017 showed a 90% proximal to mid LAD, 60% ramus and 50% right coronary artery. It was felt that she had likely infarcted the LAD territory and medical therapy recommended unless she has recurrent symptoms. She was placed on apixaban. Readmitted July 2017 and cardiac cath showed a 70% ramus, 50% followed by 55% LAD and occluded right coronary artery. Ejection 30-35%.  Apical thrombus noted. Cardiac MRI July 2017 showed ejection fraction 35%. There was a large apical thrombus. There was mild mitral regurgitation.  Patient seen by Dr. Cornelius Moras October 2017. Medical therapy recommended. There has been problems with compliance.  Last echocardiogram May 2020 showed ejection fraction 30 to 35%, moderate left ventricular hypertrophy.  Had ICD implanted June 2020. Since last seen,   Current Outpatient Medications  Medication Sig Dispense Refill   apixaban (ELIQUIS) 5 MG TABS tablet Take 1 tablet (5 mg total) by mouth 2 (two) times daily. 180 tablet 1   atorvastatin (LIPITOR) 80 MG tablet TAKE 1 TABLET EVERY DAY (Patient taking differently: Take 80 mg by mouth daily.) 90 tablet 3   ENTRESTO 24-26 MG TAKE 1 TABLET TWICE DAILY 180 tablet 1   ezetimibe (ZETIA) 10 MG tablet Take 1 tablet (10 mg total) by mouth daily. 90 tablet 3   ferrous sulfate 325 (65 FE) MG tablet Take 1 tablet (325 mg total) by mouth daily with breakfast. 30 tablet 3   metoprolol succinate (TOPROL-XL) 100 MG 24 hr tablet TAKE 1 TABLET EVERY DAY 90 tablet 3   nitroGLYCERIN (NITROSTAT) 0.4 MG SL tablet Place 1 tablet (0.4 mg total) under the tongue every 5 (five) minutes as  needed for chest pain. 75 tablet 1   pantoprazole (PROTONIX) 40 MG tablet TAKE 1 TABLET (40 MG TOTAL) BY MOUTH DAILY. 90 tablet 10   vitamin B-12 (CYANOCOBALAMIN) 1000 MCG tablet Take 1 tablet (1,000 mcg total) by mouth daily. 30 tablet 1   No current facility-administered medications for this visit.     Past Medical History:  Diagnosis Date   Anticoagulation adequate 10/14/2015   CAD (coronary artery disease)    Coronary atherosclerosis of native coronary artery, multivessel with plans for CABG to be followed   08/04/2015   Ischemic cardiomyopathy    UTI (urinary tract infection) 10/14/2015    Past Surgical History:  Procedure Laterality Date   CARDIAC CATHETERIZATION N/A 08/04/2015   Procedure: Coronary/Graft Angiography;  Surgeon: Peter M Swaziland, MD;  Location: [facility] INVASIVE CV LAB;  Service: Cardiovascular;  Laterality: N/A;   CARDIAC CATHETERIZATION N/A 10/10/2015   Procedure: Left Heart Cath and Coronary Angiography;  Surgeon: Lennette Bihari, MD;  Location: [facility] INVASIVE CV LAB;  Service: Cardiovascular;  Laterality: N/A;   CESAREAN SECTION     ICD IMPLANT N/A 09/29/2018   Procedure: ICD IMPLANT;  Surgeon: Marinus Maw, MD;  Location: [facility] INVASIVE CV LAB;  Service: Cardiovascular;  Laterality: N/A;    Social History   Socioeconomic History   Marital status: Divorced    Spouse name: Not on file   Number of children: Not on file   Years of education: 9th grade   Highest education level: Not on file  Occupational History   Not  on file  Tobacco Use   Smoking status: Some Days    Packs/day: 0.50    Years: 25.00    Total pack years: 12.50    Types: Cigarettes   Smokeless tobacco: Never  Vaping Use   Vaping Use: Never used  Substance and Sexual Activity   Alcohol use: Yes    Alcohol/week: 0.0 standard drinks of alcohol    Comment: Two drinks a night.   Drug use: No    Comment: No history of cocaine use   Sexual activity: Not on file  Other Topics Concern   Not on file   Social History Narrative   Not on file   Social Determinants of Health   Financial Resource Strain: Not on file  Food Insecurity: Not on file  Transportation Needs: Not on file  Physical Activity: Not on file  Stress: Not on file  Social Connections: Not on file  Intimate Partner Violence: Not on file    Family History  Problem Relation Age of Onset   Diabetes Mellitus II Neg Hx    CAD Neg Hx     ROS: no fevers or chills, productive cough, hemoptysis, dysphasia, odynophagia, melena, hematochezia, dysuria, hematuria, rash, seizure activity, orthopnea, PND, pedal edema, claudication. Remaining systems are negative.  Physical Exam: Well-developed well-nourished in no acute distress.  Skin is warm and dry.  HEENT is normal.  Neck is supple.  Chest is clear to auscultation with normal expansion.  Cardiovascular exam is regular rate and rhythm.  Abdominal exam nontender or distended. No masses palpated. Extremities show no edema. neuro grossly intact  ECG- personally reviewed  A/P  1 coronary artery disease-patient doing well with no chest pain.  Will continue statin.  She is not on aspirin given need for apixaban.  2 ischemic cardiomyopathy-continue Entresto and beta-blocker.  Repeat echocardiogram.  3 history of left ventricular apical thrombus-continue apixaban.  4 hyperlipidemia-continue statin.  5 tobacco abuse-patient counseled on discontinuing.  6 ICD-Per electrophysiology.  Olga Millers, MD

## 2022-03-05 ENCOUNTER — Other Ambulatory Visit: Payer: Self-pay | Admitting: Cardiology

## 2022-03-05 DIAGNOSIS — I255 Ischemic cardiomyopathy: Secondary | ICD-10-CM

## 2022-03-08 ENCOUNTER — Ambulatory Visit: Payer: Medicare HMO | Admitting: Cardiology

## 2022-03-28 ENCOUNTER — Ambulatory Visit (INDEPENDENT_AMBULATORY_CARE_PROVIDER_SITE_OTHER): Payer: Medicare HMO

## 2022-03-28 DIAGNOSIS — Z95810 Presence of automatic (implantable) cardiac defibrillator: Secondary | ICD-10-CM

## 2022-03-28 DIAGNOSIS — I255 Ischemic cardiomyopathy: Secondary | ICD-10-CM | POA: Diagnosis not present

## 2022-03-28 LAB — CUP PACEART REMOTE DEVICE CHECK
Battery Remaining Longevity: 150 mo
Battery Remaining Percentage: 100 %
Brady Statistic RV Percent Paced: 0 %
Date Time Interrogation Session: 20231227002200
HighPow Impedance: 66 Ohm
Implantable Lead Connection Status: 753985
Implantable Lead Implant Date: 20200629
Implantable Lead Location: 753860
Implantable Lead Model: 292
Implantable Lead Serial Number: 448526
Implantable Pulse Generator Implant Date: 20200629
Lead Channel Impedance Value: 469 Ohm
Lead Channel Pacing Threshold Amplitude: 0.9 V
Lead Channel Pacing Threshold Pulse Width: 0.4 ms
Lead Channel Setting Pacing Amplitude: 2 V
Lead Channel Setting Pacing Pulse Width: 0.4 ms
Lead Channel Setting Sensing Sensitivity: 0.6 mV
Pulse Gen Serial Number: 262129
Zone Setting Status: 755011

## 2022-04-16 ENCOUNTER — Ambulatory Visit: Payer: Medicare HMO | Attending: Nurse Practitioner | Admitting: Nurse Practitioner

## 2022-04-16 ENCOUNTER — Encounter: Payer: Self-pay | Admitting: Nurse Practitioner

## 2022-04-16 VITALS — BP 120/66 | HR 91 | Ht 65.0 in | Wt 157.0 lb

## 2022-04-16 DIAGNOSIS — D649 Anemia, unspecified: Secondary | ICD-10-CM

## 2022-04-16 DIAGNOSIS — I2510 Atherosclerotic heart disease of native coronary artery without angina pectoris: Secondary | ICD-10-CM | POA: Diagnosis not present

## 2022-04-16 DIAGNOSIS — I10 Essential (primary) hypertension: Secondary | ICD-10-CM

## 2022-04-16 DIAGNOSIS — I255 Ischemic cardiomyopathy: Secondary | ICD-10-CM | POA: Diagnosis not present

## 2022-04-16 DIAGNOSIS — E785 Hyperlipidemia, unspecified: Secondary | ICD-10-CM

## 2022-04-16 DIAGNOSIS — D696 Thrombocytopenia, unspecified: Secondary | ICD-10-CM

## 2022-04-16 DIAGNOSIS — I513 Intracardiac thrombosis, not elsewhere classified: Secondary | ICD-10-CM | POA: Diagnosis not present

## 2022-04-16 DIAGNOSIS — Z720 Tobacco use: Secondary | ICD-10-CM

## 2022-04-16 NOTE — Progress Notes (Signed)
Office Visit    Patient Name: Casey [name] Date of Encounter: 04/16/2022  Primary Care Provider:  Patient, No Pcp Per Primary Cardiologist:  Kirk Ruths, MD  Chief Complaint    52 year old female with a history of CAD, LV apical thrombus, ICM s/p ICD, hyperlipidemia, and tobacco use who presents for follow-up related to CAD.   Past Medical History    Past Medical History:  Diagnosis Date   Anticoagulation adequate 10/14/2015   CAD (coronary artery disease)    Coronary atherosclerosis of native coronary artery, multivessel with plans for CABG to be followed   08/04/2015   Ischemic cardiomyopathy    UTI (urinary tract infection) 10/14/2015   Past Surgical History:  Procedure Laterality Date   CARDIAC CATHETERIZATION N/A 08/04/2015   Procedure: Coronary/Graft Angiography;  Surgeon: Peter M Martinique, MD;  Location: [place] CV LAB;  Service: Cardiovascular;  Laterality: N/A;   CARDIAC CATHETERIZATION N/A 10/10/2015   Procedure: Left Heart Cath and Coronary Angiography;  Surgeon: Troy Sine, MD;  Location: [place] CV LAB;  Service: Cardiovascular;  Laterality: N/A;   CESAREAN SECTION     ICD IMPLANT N/A 09/29/2018   Procedure: ICD IMPLANT;  Surgeon: Evans Lance, MD;  Location: [place] CV LAB;  Service: Cardiovascular;  Laterality: N/A;    Allergies  Allergies  Allergen Reactions   Bempedoic Acid Other (See Comments)    hallucinations     Labs/Other Studies Reviewed    The following studies were reviewed today: Echo 08/13/2018: IMPRESSIONS     1. The left ventricle has moderate-severely reduced systolic function,  with an ejection fraction of 30-35%. The cavity size was normal. Moderate  hypertrophy of the basal septum with otherwise mild concentric LVH.Marland Kitchen Left  ventricular diastolic Doppler  parameters are consistent with impaired relaxation. Indeterminate filling  pressures.   2. Apical akinesis. Akinesis of the apical inferior, mid-apical   anteroseptum, mid-apical anterior myocarrdium. No evidence of residual  apical thrombus.   3. The right ventricle has normal systolic function. The cavity was  normal. There is no increase in right ventricular wall thickness.   4. Trivial pericardial effusion is present.   5. The aortic valve is tricuspid. Aortic valve regurgitation was not  assessed by color flow Doppler.   [facility]HC 10/10/2015:  Conclusion  There is moderate to severe left ventricular systolic dysfunction. Ost Ramus to Ramus lesion, 70% stenosed. Ost LAD to Prox LAD lesion, 50% stenosed. Prox LAD to Mid LAD lesion, 55% stenosed. Prox Cx lesion, 20% stenosed. Ost RCA lesion, 100% stenosed.   Moderately severe LV dysfunction with an ejection fraction of 30-35% with hypokinesis of the anterior to anterolateral wall with akinesis to dyskinesis in the distal anterior apical and apical segment with suggestion of a large, smooth bordered, probable mural thrombus apically extending to the distal anterolateral wall and focal basal inferior hypocontractility.   Previously documented dissection of the proximal LAD with improved filling and residual narrowing of 50-55%; 70% smooth eccentric proximal ramus stenosis, mild 20% left circumflex stenosis, and new total occlusion of the RCA at its origin with extensive left to right collaterals supplying almost the entire RCA territory distally to near [place].   Recommendation: Angiograms will be reviewed with colleagues.  Since her catheterization of 08/04/2015, the RCA is now occluded [facility] which she has extensively collateralized via left injection.  She continues to have residual LAD proximal dissection, but there is TIMI-3 flow.  Smoking cessation is essential.  It appears that she  has an extensive left ventricular mural thrombus.  An echocardiogram was ordered.  She will need to be on anticoagulation long-term.  In addition, she has reduced right femoral pulse and duplex imaging of her  right lower extremity is recommended.  Recommended.  Recent Labs: 07/15/2021: ALT 34 07/16/2021: TSH 0.919 07/27/2021: BUN 6; Creatinine, Ser 0.60; Magnesium 1.3; Potassium 4.9; Sodium 139 08/15/2021: Hemoglobin 10.3; Platelet Count 222  Recent Lipid Panel    Component Value Date/Time   CHOL 134 06/01/2021 0946   TRIG 88 06/01/2021 0946   HDL 45 06/01/2021 0946   CHOLHDL 3.0 06/01/2021 0946   CHOLHDL 2.6 10/14/2015 0357   VLDL 13 10/14/2015 0357   LDLCALC 72 06/01/2021 0946    History of Present Illness    52 year old female with the above past medical history including CAD, LV apical thrombus, ICM s/p ICD, hyperlipidemia, and tobacco use.   She was hospitalized in 2017 in the setting of abdominal pain. CT abdomen/pelvis showed thrombus at the LV apex.  Follow-up echocardiogram in May 2017 showed EF 35 to 40% with anterior and apical wall motion abnormality, grade 2 diastolic dysfunction, thrombus at the apex, mild mitral valve regurgitation, and mild left atrial enlargement.  Cardiac catheterization at the time showed 90% p-mLAD, 60% Ramus, and 50% RCA stenoses.  It was felt that she had likely infarcted the LAD territory and medical therapy was recommended unless she had recurrent symptoms.  She was started on Eliquis. She was readmitted to the hospital in July 2017, repeat cardiac catheterization showed 70% Ramus, 50% followed by 55% LAD and occluded RCA, EF 30-35%. Apical thrombus was again noted.  Cardiac MRI in July 2017 showed EF 35%, large apical thrombus, mild mitral valve regurgitation. She was evaluated by Dr. Cornelius Moras in October 2017, medical therapy was recommended.  She has had issues with adherence to medications. Repeat echocardiogram in May 2020 showed EF 30 to 35%, mild LVH, she ultimately underwent ICD implantation in June 2020. She was last seen in the office on 08/24/2021 and was stable from a acardiac standpoint.    She presents today for follow-up. Since her last visit she has  done well from a cardiac standpoint.  She denies symptoms concerning for angina.  She denies dyspnea, edema, PND, orthopnea, weight gain.  She continues to smoke.  She denies bleeding on Eliquis.  Overall, she reports feeling well.  Home Medications    Current Outpatient Medications  Medication Sig Dispense Refill   apixaban (ELIQUIS) 5 MG TABS tablet Take 1 tablet (5 mg total) by mouth 2 (two) times daily. 180 tablet 1   atorvastatin (LIPITOR) 80 MG tablet TAKE 1 TABLET EVERY DAY (Patient taking differently: Take 80 mg by mouth daily.) 90 tablet 3   ENTRESTO 24-26 MG TAKE 1 TABLET TWICE DAILY 180 tablet 3   ezetimibe (ZETIA) 10 MG tablet Take 1 tablet (10 mg total) by mouth daily. 90 tablet 3   ferrous sulfate 325 (65 FE) MG tablet Take 1 tablet (325 mg total) by mouth daily with breakfast. 30 tablet 3   metoprolol succinate (TOPROL-XL) 100 MG 24 hr tablet TAKE 1 TABLET EVERY DAY 90 tablet 3   nitroGLYCERIN (NITROSTAT) 0.4 MG SL tablet Place 1 tablet (0.4 mg total) under the tongue every 5 (five) minutes as needed for chest pain. 75 tablet 1   pantoprazole (PROTONIX) 40 MG tablet TAKE 1 TABLET (40 MG TOTAL) BY MOUTH DAILY. 90 tablet 10   vitamin B-12 (CYANOCOBALAMIN) 1000 MCG tablet Take  1 tablet (1,000 mcg total) by mouth daily. 30 tablet 1   No current facility-administered medications for this visit.     Review of Systems    She denies chest pain, palpitations, dyspnea, pnd, orthopnea, n, v, dizziness, syncope, edema, weight gain, or early satiety. All other systems reviewed and are otherwise negative except as noted above.   Physical Exam    VS:  BP 120/66 (BP Location: Left Arm, Patient Position: Sitting, Cuff Size: Normal)   Pulse 91   Ht 5\' 5"  (1.651 m)   Wt 157 lb (71.2 kg)   LMP 09/29/2018 (Exact Date)   BMI 26.13 kg/m  GEN: Well nourished, well developed, in no acute distress. HEENT: normal. Neck: Supple, no JVD, carotid bruits, or masses. Cardiac: RRR, no murmurs, rubs,  or gallops. No clubbing, cyanosis, edema.  Radials/DP/PT 2+ and equal bilaterally.  Respiratory:  Respirations regular and unlabored, clear to auscultation bilaterally. GI: Soft, nontender, nondistended, BS + x 4. MS: no deformity or atrophy. Skin: warm and dry, no rash. Neuro:  Strength and sensation are intact. Psych: Normal affect.  Accessory Clinical Findings    ECG personally reviewed by me today -NSR, 91 bpm, LAD, LVH nonspecific ST/T wave changes- no acute changes.   Lab Results  Component Value Date   WBC 5.5 08/15/2021   HGB 10.3 (L) 08/15/2021   HCT 30.8 (L) 08/15/2021   MCV 85.6 08/15/2021   PLT 222 08/15/2021   Lab Results  Component Value Date   CREATININE 0.60 07/27/2021   BUN 6 07/27/2021   NA 139 07/27/2021   K 4.9 07/27/2021   CL 99 07/27/2021   CO2 22 07/27/2021   Lab Results  Component Value Date   ALT 34 07/15/2021   AST 73 (H) 07/15/2021   ALKPHOS 51 07/15/2021   BILITOT 0.4 07/15/2021   Lab Results  Component Value Date   CHOL 134 06/01/2021   HDL 45 06/01/2021   LDLCALC 72 06/01/2021   TRIG 88 06/01/2021   CHOLHDL 3.0 06/01/2021    Lab Results  Component Value Date   HGBA1C 4.9 10/14/2015    Assessment & Plan    1. CAD: Cath in July 2017 showed 70% Ramus, 50% followed by 55% LAD, and occluded RCA, EF 30-35%. Stable with no anginal symptoms. No indication for ischemic evaluation.  Continue metoprolol, Entresto, Lipitor, and Zetia.  2. ICM: Most recent echo in May 2020 showed EF 30 to 35%, mild LVH, she ultimately underwent ICD implantation in June 2020. Euvolemic and well compensated on exam.  Continue metoprolol, Entresto.  3. Left ventricular apical thrombus: Cardiac MRI in July 2017 showed EF 35%, large apical thrombus, mild mitral valve regurgitation. She was evaluated by Dr. Roxy Manns in October 2017, medical therapy was recommended.  Continue Eliquis.   4. Hypertension: BP well controlled. Continue current antihypertensive regimen.    5. Hyperlipidemia: LDL was 72 in March 2023.  She did not tolerate Nexlizet.  Continue Lipitor, Zetia. Will update CMET, fasting lipid panel.    6. Anemia/thrombocytopenia: Hemoglobin was 10.3 in April 2023. She saw hematology/oncology-it appears follow-up was recommended as needed.  Will repeat CBC.    7. Tobacco use: She continues to smoke. Full cessation advised though she states she does not plan on quitting.   8. Disposition: Follow-up in 6 months.      Lenna Sciara, NP 04/16/2022, 4:17 PM

## 2022-04-16 NOTE — Patient Instructions (Signed)
Medication Instructions:  Your physician recommends that you continue on your current medications as directed. Please refer to the Current Medication list given to you today.  *If you need a refill on your cardiac medications before your next appointment, please call your pharmacy*   Lab Work: Your physician recommends that you return for lab work in 1 week CBC, CMET, Fasting lipid panel    If you have labs (blood work) drawn today and your tests are completely normal, you will receive your results only by: [place] (if you have [place]) OR A paper copy in the mail If you have any lab test that is abnormal or we need to change your treatment, we will call you to review the results.   Testing/Procedures: NONE ordered at this time of appointment     Follow-Up: At [facility], you and your health needs are our priority.  As part of our continuing mission to provide you with exceptional heart care, we have created designated Provider Care Teams.  These Care Teams include your primary Cardiologist (physician) and Advanced Practice Providers (APPs -  Physician Assistants and Nurse Practitioners) who all work together to provide you with the care you need, when you need it.  We recommend signing up for the patient portal called "MyChart".  Sign up information is provided on this After Visit Summary.  MyChart is used to connect with patients for Virtual Visits (Telemedicine).  Patients are able to view lab/test results, encounter notes, upcoming appointments, etc.  Non-urgent messages can be sent to your provider as well.   To learn more about what you can do with MyChart, go to [email or web address].    Your next appointment:   6 month(s)  Provider:   Kirk Ruths, MD     Other Instructions

## 2022-04-17 NOTE — Progress Notes (Signed)
Remote ICD transmission.   

## 2022-05-02 LAB — CBC

## 2022-05-03 LAB — COMPREHENSIVE METABOLIC PANEL
ALT: 11 IU/L (ref 0–32)
AST: 17 IU/L (ref 0–40)
Albumin/Globulin Ratio: 1.3 (ref 1.2–2.2)
Albumin: 4.4 g/dL (ref 3.8–4.9)
Alkaline Phosphatase: 73 IU/L (ref 44–121)
BUN/Creatinine Ratio: 9 (ref 9–23)
BUN: 6 mg/dL (ref 6–24)
Bilirubin Total: <0.2 mg/dL (ref 0.0–1.2)
CO2: 19 mmol/L — ABNORMAL LOW (ref 20–29)
Calcium: 9.9 mg/dL (ref 8.7–10.2)
Chloride: 106 mmol/L (ref 96–106)
Creatinine, Ser: 0.67 mg/dL (ref 0.57–1.00)
Globulin, Total: 3.3 g/dL (ref 1.5–4.5)
Glucose: 77 mg/dL (ref 70–99)
Potassium: 3.7 mmol/L (ref 3.5–5.2)
Sodium: 143 mmol/L (ref 134–144)
Total Protein: 7.7 g/dL (ref 6.0–8.5)
eGFR: 106 mL/min/{1.73_m2} (ref >59)

## 2022-05-03 LAB — CBC
Hematocrit: 35.1 % (ref 34.0–46.6)
Hemoglobin: 11.4 g/dL (ref 11.1–15.9)
MCH: 29.1 pg (ref 26.6–33.0)
MCHC: 32.5 g/dL (ref 31.5–35.7)
MCV: 90 fL (ref 79–97)
Platelets: 368 10*3/uL (ref 150–450)
RBC: 3.92 x10E6/uL (ref 3.77–5.28)
RDW: 16.5 % — ABNORMAL HIGH (ref 11.7–15.4)
WBC: 7.4 10*3/uL (ref 3.4–10.8)

## 2022-05-03 LAB — LIPID PANEL
Chol/HDL Ratio: 3.1 ratio (ref 0.0–4.4)
Cholesterol, Total: 151 mg/dL (ref 100–199)
HDL: 48 mg/dL (ref >39)
LDL Chol Calc (NIH): 80 mg/dL (ref 0–99)
Triglycerides: 131 mg/dL (ref 0–149)
VLDL Cholesterol Cal: 23 mg/dL (ref 5–40)

## 2022-05-09 ENCOUNTER — Telehealth: Payer: Self-pay

## 2022-05-09 DIAGNOSIS — E7800 Pure hypercholesterolemia, unspecified: Secondary | ICD-10-CM

## 2022-05-09 NOTE — Telephone Encounter (Signed)
Spoke with pt. Pt was notified of results and recommendations to see pharm-D clinic. Pt will continue her current medication and follow up as planned.

## 2022-06-07 ENCOUNTER — Ambulatory Visit: Payer: Medicare HMO | Attending: Nurse Practitioner

## 2022-06-07 NOTE — Progress Notes (Deleted)
Office Visit    Patient Name: Casey [name] Date of Encounter: 06/07/2022  Primary Care Provider:  Patient, No Pcp Per Primary Cardiologist:  Kirk Ruths, MD  Chief Complaint    Hyperlipidemia   Significant Past Medical History                     Allergies  Allergen Reactions   Bempedoic Acid Other (See Comments)    hallucinations    History of Present Illness    Casey [name] is a 52 y.o. female patient of Dr ***,  Insurance underwriter Carrier:   LDL Cholesterol goal:    Current Medications:     Previously tried:    Family Hx:     Social Hx: Tobacco: Alcohol:      Diet:      Exercise:   Adherence Assessment  Do you ever forget to take your medication? '[]'$ Yes '[]'$ No  Do you ever skip doses due to side effects? '[]'$ Yes '[]'$ No  Do you have trouble affording your medicines? '[]'$ Yes '[]'$ No  Are you ever unable to pick up your medication due to transportation difficulties? '[]'$ Yes '[]'$ No  Do you ever stop taking your medications because you don't believe they are helping? '[]'$ Yes '[]'$ No  Do you check your weight daily? '[]'$ Yes '[]'$ No   Adherence strategy: ***  Barriers to obtaining medications: ***     Accessory Clinical Findings   Lab Results  Component Value Date   CHOL 151 05/02/2022   HDL 48 05/02/2022   LDLCALC 80 05/02/2022   TRIG 131 05/02/2022   CHOLHDL 3.1 05/02/2022    Lab Results  Component Value Date   ALT 11 05/02/2022   AST 17 05/02/2022   ALKPHOS 73 05/02/2022   BILITOT <0.2 05/02/2022   Lab Results  Component Value Date   CREATININE 0.67 05/02/2022   BUN 6 05/02/2022   NA 143 05/02/2022   K 3.7 05/02/2022   CL 106 05/02/2022   CO2 19 (L) 05/02/2022   Lab Results  Component Value Date   HGBA1C 4.9 10/14/2015    Home Medications    Current Outpatient Medications  Medication Sig Dispense Refill   apixaban (ELIQUIS) 5 MG TABS tablet Take 1 tablet (5 mg total) by mouth 2 (two) times daily. 180 tablet 1   atorvastatin  (LIPITOR) 80 MG tablet TAKE 1 TABLET EVERY DAY (Patient taking differently: Take 80 mg by mouth daily.) 90 tablet 3   ENTRESTO 24-26 MG TAKE 1 TABLET TWICE DAILY 180 tablet 3   ezetimibe (ZETIA) 10 MG tablet Take 1 tablet (10 mg total) by mouth daily. 90 tablet 3   ferrous sulfate 325 (65 FE) MG tablet Take 1 tablet (325 mg total) by mouth daily with breakfast. 30 tablet 3   metoprolol succinate (TOPROL-XL) 100 MG 24 hr tablet TAKE 1 TABLET EVERY DAY 90 tablet 3   nitroGLYCERIN (NITROSTAT) 0.4 MG SL tablet Place 1 tablet (0.4 mg total) under the tongue every 5 (five) minutes as needed for chest pain. 75 tablet 1   pantoprazole (PROTONIX) 40 MG tablet TAKE 1 TABLET (40 MG TOTAL) BY MOUTH DAILY. 90 tablet 10   vitamin B-12 (CYANOCOBALAMIN) 1000 MCG tablet Take 1 tablet (1,000 mcg total) by mouth daily. 30 tablet 1   No current facility-administered medications for this visit.     Assessment & Plan    No problem-specific Assessment & Plan notes found for this encounter.   Tommy Medal, PharmD CPP [place]  [place], [place] [address] [phone]  06/07/2022, 7:55 AM

## 2022-06-08 ENCOUNTER — Observation Stay (HOSPITAL_COMMUNITY)
Admission: EM | Admit: 2022-06-08 | Discharge: 2022-06-11 | Disposition: A | Discharge status: Home / Self Care | Payer: Medicare HMO | Attending: Internal Medicine | Admitting: Internal Medicine

## 2022-06-08 ENCOUNTER — Other Ambulatory Visit: Payer: Self-pay

## 2022-06-08 ENCOUNTER — Emergency Department (HOSPITAL_COMMUNITY): Payer: Medicare HMO

## 2022-06-08 ENCOUNTER — Encounter (HOSPITAL_COMMUNITY): Payer: Self-pay

## 2022-06-08 DIAGNOSIS — R55 Syncope and collapse: Principal | ICD-10-CM | POA: Diagnosis present

## 2022-06-08 DIAGNOSIS — I255 Ischemic cardiomyopathy: Secondary | ICD-10-CM | POA: Insufficient documentation

## 2022-06-08 DIAGNOSIS — I700 Atherosclerosis of aorta: Secondary | ICD-10-CM | POA: Insufficient documentation

## 2022-06-08 DIAGNOSIS — Z950 Presence of cardiac pacemaker: Secondary | ICD-10-CM | POA: Diagnosis not present

## 2022-06-08 DIAGNOSIS — Z79899 Other long term (current) drug therapy: Secondary | ICD-10-CM | POA: Insufficient documentation

## 2022-06-08 DIAGNOSIS — I110 Hypertensive heart disease with heart failure: Secondary | ICD-10-CM | POA: Insufficient documentation

## 2022-06-08 DIAGNOSIS — R2681 Unsteadiness on feet: Secondary | ICD-10-CM | POA: Insufficient documentation

## 2022-06-08 DIAGNOSIS — R471 Dysarthria and anarthria: Secondary | ICD-10-CM | POA: Diagnosis not present

## 2022-06-08 DIAGNOSIS — Z7901 Long term (current) use of anticoagulants: Secondary | ICD-10-CM | POA: Insufficient documentation

## 2022-06-08 DIAGNOSIS — E872 Acidosis, unspecified: Secondary | ICD-10-CM | POA: Diagnosis not present

## 2022-06-08 DIAGNOSIS — F17210 Nicotine dependence, cigarettes, uncomplicated: Secondary | ICD-10-CM | POA: Diagnosis not present

## 2022-06-08 DIAGNOSIS — E871 Hypo-osmolality and hyponatremia: Secondary | ICD-10-CM | POA: Diagnosis not present

## 2022-06-08 DIAGNOSIS — I2510 Atherosclerotic heart disease of native coronary artery without angina pectoris: Secondary | ICD-10-CM | POA: Diagnosis not present

## 2022-06-08 DIAGNOSIS — I5022 Chronic systolic (congestive) heart failure: Secondary | ICD-10-CM | POA: Diagnosis not present

## 2022-06-08 DIAGNOSIS — E876 Hypokalemia: Secondary | ICD-10-CM

## 2022-06-08 DIAGNOSIS — I472 Ventricular tachycardia, unspecified: Secondary | ICD-10-CM | POA: Diagnosis not present

## 2022-06-08 LAB — COMPREHENSIVE METABOLIC PANEL
ALT: 14 U/L (ref 0–44)
AST: 30 U/L (ref 15–41)
Albumin: 3.7 g/dL (ref 3.5–5.0)
Alkaline Phosphatase: 65 U/L (ref 38–126)
Anion gap: 17 — ABNORMAL HIGH (ref 5–15)
BUN: 5 mg/dL — ABNORMAL LOW (ref 6–20)
CO2: 22 mmol/L (ref 22–32)
Calcium: 9.1 mg/dL (ref 8.9–10.3)
Chloride: 91 mmol/L — ABNORMAL LOW (ref 98–111)
Creatinine, Ser: 0.73 mg/dL (ref 0.44–1.00)
GFR, Estimated: >60 mL/min (ref >60)
Glucose, Bld: 110 mg/dL — ABNORMAL HIGH (ref 70–99)
Potassium: 3.4 mmol/L — ABNORMAL LOW (ref 3.5–5.1)
Sodium: 130 mmol/L — ABNORMAL LOW (ref 135–145)
Total Bilirubin: 0.9 mg/dL (ref 0.3–1.2)
Total Protein: 7.7 g/dL (ref 6.5–8.1)

## 2022-06-08 LAB — I-STAT VENOUS BLOOD GAS, ED
Acid-base deficit: 6 mmol/L — ABNORMAL HIGH (ref 0.0–2.0)
Acid-base deficit: 6 mmol/L — ABNORMAL HIGH (ref 0.0–2.0)
Bicarbonate: 17.5 mmol/L — ABNORMAL LOW (ref 20.0–28.0)
Bicarbonate: 20.6 mmol/L (ref 20.0–28.0)
Calcium, Ion: 0.93 mmol/L — ABNORMAL LOW (ref 1.15–1.40)
Calcium, Ion: 1.11 mmol/L — ABNORMAL LOW (ref 1.15–1.40)
HCT: 35 % — ABNORMAL LOW (ref 36.0–46.0)
HCT: 38 % (ref 36.0–46.0)
Hemoglobin: 11.9 g/dL — ABNORMAL LOW (ref 12.0–15.0)
Hemoglobin: 12.9 g/dL (ref 12.0–15.0)
O2 Saturation: 90 %
O2 Saturation: 98 %
Patient temperature: 37
Potassium: 3.1 mmol/L — ABNORMAL LOW (ref 3.5–5.1)
Potassium: >8.5 mmol/L (ref 3.5–5.1)
Sodium: 123 mmol/L — ABNORMAL LOW (ref 135–145)
Sodium: 130 mmol/L — ABNORMAL LOW (ref 135–145)
TCO2: 18 mmol/L — ABNORMAL LOW (ref 22–32)
TCO2: 22 mmol/L (ref 22–32)
pCO2, Ven: 28 mmHg — ABNORMAL LOW (ref 44–60)
pCO2, Ven: 41.5 mmHg — ABNORMAL LOW (ref 44–60)
pH, Ven: 7.303 (ref 7.25–7.43)
pH, Ven: 7.404 (ref 7.25–7.43)
pO2, Ven: 123 mmHg — ABNORMAL HIGH (ref 32–45)
pO2, Ven: 58 mmHg — ABNORMAL HIGH (ref 32–45)

## 2022-06-08 LAB — CBC WITH DIFFERENTIAL/PLATELET
Abs Immature Granulocytes: 0.02 10*3/uL (ref 0.00–0.07)
Basophils Absolute: 0.1 10*3/uL (ref 0.0–0.1)
Basophils Relative: 1 %
Eosinophils Absolute: 0 10*3/uL (ref 0.0–0.5)
Eosinophils Relative: 0 %
HCT: 32.1 % — ABNORMAL LOW (ref 36.0–46.0)
Hemoglobin: 10.7 g/dL — ABNORMAL LOW (ref 12.0–15.0)
Immature Granulocytes: 0 %
Lymphocytes Relative: 23 %
Lymphs Abs: 1.5 10*3/uL (ref 0.7–4.0)
MCH: 30.1 pg (ref 26.0–34.0)
MCHC: 33.3 g/dL (ref 30.0–36.0)
MCV: 90.4 fL (ref 80.0–100.0)
Monocytes Absolute: 0.3 10*3/uL (ref 0.1–1.0)
Monocytes Relative: 4 %
Neutro Abs: 4.7 10*3/uL (ref 1.7–7.7)
Neutrophils Relative %: 72 %
Platelets: 430 10*3/uL — ABNORMAL HIGH (ref 150–400)
RBC: 3.55 MIL/uL — ABNORMAL LOW (ref 3.87–5.11)
RDW: 19.3 % — ABNORMAL HIGH (ref 11.5–15.5)
WBC: 6.6 10*3/uL (ref 4.0–10.5)
nRBC: 0.3 % — ABNORMAL HIGH (ref 0.0–0.2)

## 2022-06-08 LAB — TROPONIN I (HIGH SENSITIVITY)
Troponin I (High Sensitivity): 28 ng/L — ABNORMAL HIGH (ref <18)
Troponin I (High Sensitivity): 29 ng/L — ABNORMAL HIGH (ref <18)

## 2022-06-08 LAB — BRAIN NATRIURETIC PEPTIDE: B Natriuretic Peptide: 467.6 pg/mL — ABNORMAL HIGH (ref 0.0–100.0)

## 2022-06-08 LAB — CBG MONITORING, ED: Glucose-Capillary: 141 mg/dL — ABNORMAL HIGH (ref 70–99)

## 2022-06-08 LAB — ETHANOL: Alcohol, Ethyl (B): <10 mg/dL (ref <10)

## 2022-06-08 LAB — MAGNESIUM: Magnesium: 1.1 mg/dL — ABNORMAL LOW (ref 1.7–2.4)

## 2022-06-08 MED ORDER — LACTATED RINGERS IV BOLUS
500.0000 mL | Freq: Once | INTRAVENOUS | Status: AC
  Administered 2022-06-08: 500 mL via INTRAVENOUS

## 2022-06-08 MED ORDER — POTASSIUM CHLORIDE CRYS ER 20 MEQ PO TBCR
40.0000 meq | EXTENDED_RELEASE_TABLET | Freq: Once | ORAL | Status: AC
  Administered 2022-06-08: 40 meq via ORAL
  Filled 2022-06-08: qty 2

## 2022-06-08 MED ORDER — MAGNESIUM SULFATE 4 GM/100ML IV SOLN
4.0000 g | Freq: Once | INTRAVENOUS | Status: AC
  Administered 2022-06-08: 4 g via INTRAVENOUS
  Filled 2022-06-08: qty 100

## 2022-06-08 MED ORDER — THIAMINE HCL 100 MG/ML IJ SOLN
100.0000 mg | Freq: Once | INTRAMUSCULAR | Status: AC
  Administered 2022-06-08: 100 mg via INTRAVENOUS
  Filled 2022-06-08: qty 2

## 2022-06-08 NOTE — ED Notes (Signed)
[employer] [facility]

## 2022-06-08 NOTE — Hospital Course (Signed)
Syncope- cardiogenic etiology vs alcoholic withdrawal seizures The patient had a syncopal episode around 7:30 PM on 3/8, witnessed by her daughter and grandsons. Patient did not have any prodromal symptoms prior to this episode. Patient was frothing at the mouth and patient's family thought that she may have had a seizure, although she does not have a history of seizures. Patient's daughter notes that the patient drinks half to one bottle of vodka everyday and they are unsure when her last drink was. Ethanol level undetectable in the ED so withdrawal symptoms from alcohol is likely. Patient's has a history of ischemic cardiomyopathy and an LV thrombus so cardiac etiology of her syncope is also possible. EEG showed no seizures and repeat CT head showed no acute abnormalities.  Alcohol Use Disorder Patient drinks about one bottle of Vodka daily. Patient is in the pre-contemplative stage of discontinuing alcohol. Discussed with her the importance of abstinence, she can discuss medication options in her follow-up appointment with her PCP on 06/19/2022.   Acute onset dysarthria and mental status change Code stroke called as the patient acutely became dysarthric upon initial evaluation. CBG 139 during this episode. By the time neurology arrived at the bedside, the patient's speech had improved, however, with her history of an LV thrombus, she is certainly high risk for a stroke and is also high risk of having an intracranial bleed, given that she is on eliquis and hit her head after her syncopal episode earlier. CT head was negative for any intracranial abnormality and CT angio head/neck was negative for intracranial large vessel occlusion or significant stenosis. MRI brain was negative.   Ischemic cardiomyopathy s/p ICD CAD HLD Cath in 10/2015 showed 70% ramus, 50% followed by 55% LAD, and occluded RCA. Last EF 30-35% in 2020 with concentric LVH and impaired relaxation. She underwent ICD implantation in June  2020. The patient is on metoprolol XL 100 mg daily and Entresto 50 mg bid at home. Also on atorvastatin 80 mg daily. Continued atorvastatin and metoprolol. Held [place] at discharge and will follow-up in the clinic on 3/19 to discuss restarting the medication.   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 Resumed home Eliquis.

## 2022-06-08 NOTE — ED Provider Notes (Signed)
[facility] Provider Note   CSN: [id number] Arrival date & time: 06/08/22  2017     History  No chief complaint on file.   Casey [name] is a 52 y.o. female.  This is a 52 year old female with history of CAD with multivessel disease, LV apical thrombus, ischemic cardiomyopathy status post ICD, hyperlipidemia, tobacco use who presents to the emergency department for syncopal episode.  Patient states she was sitting on the edge of her bed when she woke up and EMS was at her bedside.  She had felt fine prior, was just watching TV when she had a syncopal episode.  She denies any chest pain or shortness of breath, states that she feels fine now.  Endorses some EtOH use this evening but states it was minimal.  Denies any fevers, chills, abdominal pain, nausea or vomiting.     Home Medications Prior to Admission medications   Medication Sig Start Date End Date Taking? Authorizing Provider  apixaban (ELIQUIS) 5 MG TABS tablet Take 1 tablet (5 mg total) by mouth 2 (two) times daily. 02/19/22   Lelon Perla, MD  atorvastatin (LIPITOR) 80 MG tablet TAKE 1 TABLET EVERY DAY Patient taking differently: Take 80 mg by mouth daily. 05/18/21   Lelon Perla, MD  ENTRESTO 24-26 MG TAKE 1 TABLET TWICE DAILY 03/07/22   Lelon Perla, MD  ezetimibe (ZETIA) 10 MG tablet Take 1 tablet (10 mg total) by mouth daily. 09/20/21   Lelon Perla, MD  ferrous sulfate 325 (65 FE) MG tablet Take 1 tablet (325 mg total) by mouth daily with breakfast. 07/18/21   Ghimire, Henreitta Leber, MD  metoprolol succinate (TOPROL-XL) 100 MG 24 hr tablet TAKE 1 TABLET EVERY DAY 10/02/21   Lelon Perla, MD  nitroGLYCERIN (NITROSTAT) 0.4 MG SL tablet Place 1 tablet (0.4 mg total) under the tongue every 5 (five) minutes as needed for chest pain. 04/22/18 09/14/22  Kroeger, Lorelee Cover., PA-C  pantoprazole (PROTONIX) 40 MG tablet TAKE 1 TABLET (40 MG TOTAL) BY MOUTH DAILY. 02/09/22    Lelon Perla, MD  vitamin B-12 (CYANOCOBALAMIN) 1000 MCG tablet Take 1 tablet (1,000 mcg total) by mouth daily. 07/17/21   Ghimire, Henreitta Leber, MD      Allergies    Bempedoic acid    Review of Systems   Review of Systems  Constitutional:  Negative for chills and fever.  Respiratory:  Negative for cough and shortness of breath.   Cardiovascular:  Negative for chest pain.  Gastrointestinal:  Negative for abdominal pain.  Genitourinary:  Negative for dysuria.  Neurological:  Positive for syncope. Negative for weakness and headaches.    Physical Exam Updated Vital Signs LMP 09/29/2018 (Exact Date)  Physical Exam Vitals and nursing note reviewed.  Constitutional:      General: She is not in acute distress.    Appearance: She is not ill-appearing or toxic-appearing.  HENT:     Nose: Nose normal.     Mouth/Throat:     Mouth: Mucous membranes are moist.  Eyes:     Pupils: Pupils are equal, round, and reactive to light.  Cardiovascular:     Rate and Rhythm: Normal rate and regular rhythm.     Pulses: Normal pulses.     Heart sounds: Normal heart sounds. No murmur heard.    No friction rub. No gallop.  Pulmonary:     Effort: Pulmonary effort is normal. No respiratory distress.  Breath sounds: No stridor. No wheezing, rhonchi or rales.  Abdominal:     General: There is no distension.     Palpations: Abdomen is soft.     Tenderness: There is no abdominal tenderness. There is no guarding or rebound.  Skin:    General: Skin is warm.     Capillary Refill: Capillary refill takes less than 2 seconds.  Neurological:     General: No focal deficit present.     Mental Status: She is alert and oriented to person, place, and time.     ED Results / Procedures / Treatments   Labs (all labs ordered are listed, but only abnormal results are displayed) Labs Reviewed - No data to display  EKG None  Radiology No results found.  Procedures Procedures    Medications Ordered  in ED Medications - No data to display  ED Course/ Medical Decision Making/ A&P                             Medical Decision Making Patient presents with syncopal episode, clinical picture is complicated by ischemic cardiomyopathy with ICD, LV thrombus on Eliquis, and CAD.  I am concerned for possible dysrhythmia versus cardiogenic syncope.  Will interrogate patient's ICD.  Will obtain a cardiac workup including serial troponins and EKG.  Will get a CBC to evaluate for possible anemia, CMP to evaluate for electrolyte abnormalities.  Ethanol sent.  As patient is on a blood thinner and had a syncopal episode, will obtain a CT head to rule out intracranial hemorrhage as well as a C-spine.  Patient's device was interrogated she had 2 episodes of nonsustained ventricular tachycardia around the time she had a syncopal episode.  Discussed this case with Dr. Humphrey Rolls of cardiology who agreed with syncopal workup including troponins and will reengage once workup complete.  Unlikely to have nonsustained VT causing syncopal episode.  I personally reviewed and interpreted patient's labs.  Patient has multiple electrolyte abnormalities including hyponatremia to 130, hypokalemia to 3.4, hypochloremia 91 with anion gap 17.  VBG and lactic acid ordered to further evaluate anion gap.  CBC shows no leukocytosis with a stable hemoglobin of 10.7.  Ethanol negative.  Initial troponin 28 and repeat was 29.  I personally reviewed and interpreted patient's imaging and agree with radiology, no acute intracranial hemorrhage, no space-occupying lesion, no large territory infarction.  CT C-spine without acute fracture or malalignment.  I personally reviewed and interpreted patient's EKG, sinus tachycardia rate 113 PR, QRS, QTc normal, no acute ischemic changes when compared to prior.  I reengaged Dr. Humphrey Rolls of cardiology.  We discussed patient likely needs admission due to syncope and multiple electrolyte abnormalities with  persistent tachycardia.  He agrees and requests an echo to be done as well.  Called discussed this case with the hospitalist team who agreed to admit patient to their service for further management.  Patient was stable upon admission to hospital.    Problems Addressed: Hypokalemia: acute illness or injury that poses a threat to life or bodily functions Hypomagnesemia: acute illness or injury that poses a threat to life or bodily functions Hyponatremia: acute illness or injury that poses a threat to life or bodily functions Syncope, unspecified syncope type: acute illness or injury that poses a threat to life or bodily functions  Amount and/or Complexity of Data Reviewed External Data Reviewed: notes.    Details: Patient follows closely with cardiology, history of LV thrombus  on Eliquis, last echo in 2020, has an ICD in place due to ischemic cardiomyopathy. Labs: ordered. Decision-making details documented in ED Course. Radiology: ordered and independent interpretation performed. Decision-making details documented in ED Course. ECG/medicine tests: ordered and independent interpretation performed. Decision-making details documented in ED Course.  Risk Prescription drug management. Decision regarding hospitalization.          Final Clinical Impression(s) / ED Diagnoses Final diagnoses:  None    Rx / DC Orders ED Discharge Orders     None         Jimmie Molly, MD 06/08/22 [id number]    Isla Pence, MD 06/11/22 [phone]

## 2022-06-08 NOTE — ED Triage Notes (Signed)
Pt was BIB by [facility] - Pt's daughter called d/t pt was on floor after sitting on bed - Pt does not remember event.  It appears there was a good amount of liquor out of the bottle beside her - pt appears intoxicated.

## 2022-06-09 ENCOUNTER — Observation Stay (HOSPITAL_COMMUNITY): Payer: Medicare HMO

## 2022-06-09 ENCOUNTER — Observation Stay (HOSPITAL_BASED_OUTPATIENT_CLINIC_OR_DEPARTMENT_OTHER): Payer: Medicare HMO

## 2022-06-09 DIAGNOSIS — F1010 Alcohol abuse, uncomplicated: Secondary | ICD-10-CM | POA: Diagnosis not present

## 2022-06-09 DIAGNOSIS — R55 Syncope and collapse: Secondary | ICD-10-CM

## 2022-06-09 DIAGNOSIS — E872 Acidosis, unspecified: Secondary | ICD-10-CM

## 2022-06-09 DIAGNOSIS — E871 Hypo-osmolality and hyponatremia: Secondary | ICD-10-CM | POA: Diagnosis not present

## 2022-06-09 LAB — URINALYSIS, ROUTINE W REFLEX MICROSCOPIC
Bilirubin Urine: NEGATIVE
Glucose, UA: NEGATIVE mg/dL
Ketones, ur: NEGATIVE mg/dL
Leukocytes,Ua: NEGATIVE
Nitrite: NEGATIVE
Protein, ur: >=300 mg/dL — AB
Specific Gravity, Urine: 1.009 (ref 1.005–1.030)
pH: 7 (ref 5.0–8.0)

## 2022-06-09 LAB — CBC
HCT: 28.9 % — ABNORMAL LOW (ref 36.0–46.0)
Hemoglobin: 10 g/dL — ABNORMAL LOW (ref 12.0–15.0)
MCH: 30 pg (ref 26.0–34.0)
MCHC: 34.6 g/dL (ref 30.0–36.0)
MCV: 86.8 fL (ref 80.0–100.0)
Platelets: 292 10*3/uL (ref 150–400)
RBC: 3.33 MIL/uL — ABNORMAL LOW (ref 3.87–5.11)
RDW: 17.6 % — ABNORMAL HIGH (ref 11.5–15.5)
WBC: 8.7 10*3/uL (ref 4.0–10.5)
nRBC: 0.2 % (ref 0.0–0.2)

## 2022-06-09 LAB — DIFFERENTIAL
Abs Immature Granulocytes: 0.03 10*3/uL (ref 0.00–0.07)
Basophils Absolute: 0.1 10*3/uL (ref 0.0–0.1)
Basophils Relative: 1 %
Eosinophils Absolute: 0 10*3/uL (ref 0.0–0.5)
Eosinophils Relative: 0 %
Immature Granulocytes: 0 %
Lymphocytes Relative: 16 %
Lymphs Abs: 1.4 10*3/uL (ref 0.7–4.0)
Monocytes Absolute: 0.3 10*3/uL (ref 0.1–1.0)
Monocytes Relative: 4 %
Neutro Abs: 6.9 10*3/uL (ref 1.7–7.7)
Neutrophils Relative %: 79 %

## 2022-06-09 LAB — I-STAT BETA HCG BLOOD, ED (MC, WL, AP ONLY): I-stat hCG, quantitative: <5 m[IU]/mL (ref <5)

## 2022-06-09 LAB — BASIC METABOLIC PANEL
Anion gap: 11 (ref 5–15)
BUN: <5 mg/dL — ABNORMAL LOW (ref 6–20)
CO2: 22 mmol/L (ref 22–32)
Calcium: 9 mg/dL (ref 8.9–10.3)
Chloride: 96 mmol/L — ABNORMAL LOW (ref 98–111)
Creatinine, Ser: 0.72 mg/dL (ref 0.44–1.00)
GFR, Estimated: >60 mL/min (ref >60)
Glucose, Bld: 97 mg/dL (ref 70–99)
Potassium: 3.7 mmol/L (ref 3.5–5.1)
Sodium: 129 mmol/L — ABNORMAL LOW (ref 135–145)

## 2022-06-09 LAB — ECHOCARDIOGRAM COMPLETE BUBBLE STUDY
Area-P 1/2: 12.44 cm2
MV M vel: 3.44 m/s
MV Peak grad: 47.3 mmHg
S' Lateral: 3.6 cm

## 2022-06-09 LAB — SALICYLATE LEVEL: Salicylate Lvl: <7 mg/dL — ABNORMAL LOW (ref 7.0–30.0)

## 2022-06-09 LAB — I-STAT CHEM 8, ED
BUN: <3 mg/dL — ABNORMAL LOW (ref 6–20)
Calcium, Ion: 0.96 mmol/L — ABNORMAL LOW (ref 1.15–1.40)
Chloride: 91 mmol/L — ABNORMAL LOW (ref 98–111)
Creatinine, Ser: 0.4 mg/dL — ABNORMAL LOW (ref 0.44–1.00)
Glucose, Bld: 113 mg/dL — ABNORMAL HIGH (ref 70–99)
HCT: 33 % — ABNORMAL LOW (ref 36.0–46.0)
Hemoglobin: 11.2 g/dL — ABNORMAL LOW (ref 12.0–15.0)
Potassium: 4.5 mmol/L (ref 3.5–5.1)
Sodium: 125 mmol/L — ABNORMAL LOW (ref 135–145)
TCO2: 28 mmol/L (ref 22–32)

## 2022-06-09 LAB — RAPID URINE DRUG SCREEN, HOSP PERFORMED
Amphetamines: NOT DETECTED
Barbiturates: NOT DETECTED
Benzodiazepines: NOT DETECTED
Cocaine: NOT DETECTED
Opiates: NOT DETECTED
Tetrahydrocannabinol: NOT DETECTED

## 2022-06-09 LAB — ETHANOL: Alcohol, Ethyl (B): <10 mg/dL (ref <10)

## 2022-06-09 LAB — OSMOLALITY: Osmolality: 276 mOsm/kg (ref 275–295)

## 2022-06-09 LAB — AMMONIA: Ammonia: 38 umol/L — ABNORMAL HIGH (ref 9–35)

## 2022-06-09 LAB — APTT: aPTT: 33 seconds (ref 24–36)

## 2022-06-09 LAB — LACTIC ACID, PLASMA
Lactic Acid, Venous: 7.9 mmol/L (ref 0.5–1.9)
Lactic Acid, Venous: 2.2 mmol/L (ref 0.5–1.9)

## 2022-06-09 LAB — CBG MONITORING, ED: Glucose-Capillary: 139 mg/dL — ABNORMAL HIGH (ref 70–99)

## 2022-06-09 LAB — MAGNESIUM: Magnesium: 2.3 mg/dL (ref 1.7–2.4)

## 2022-06-09 LAB — PROTIME-INR
INR: 1.3 — ABNORMAL HIGH (ref 0.8–1.2)
Prothrombin Time: 15.7 seconds — ABNORMAL HIGH (ref 11.4–15.2)

## 2022-06-09 MED ORDER — LEVETIRACETAM IN NACL 1000 MG/100ML IV SOLN
1000.0000 mg | INTRAVENOUS | Status: AC
  Administered 2022-06-09 (×2): 1000 mg via INTRAVENOUS
  Filled 2022-06-09: qty 100

## 2022-06-09 MED ORDER — PERFLUTREN LIPID MICROSPHERE
1.0000 mL | INTRAVENOUS | Status: AC | PRN
  Administered 2022-06-09: 6 mL via INTRAVENOUS

## 2022-06-09 MED ORDER — SODIUM CHLORIDE 0.9 % IV SOLN: 2000.0000 mg | INTRAVENOUS | Status: DC

## 2022-06-09 MED ORDER — SENNOSIDES-DOCUSATE SODIUM 8.6-50 MG PO TABS: 1.0000 | ORAL_TABLET | Freq: Every evening | ORAL | Status: DC | PRN

## 2022-06-09 MED ORDER — METOPROLOL SUCCINATE ER 100 MG PO TB24
100.0000 mg | ORAL_TABLET | Freq: Every day | ORAL | Status: DC
  Administered 2022-06-09 – 2022-06-11 (×3): 100 mg via ORAL
  Filled 2022-06-09: qty 4
  Filled 2022-06-09 (×2): qty 1

## 2022-06-09 MED ORDER — THIAMINE MONONITRATE 100 MG PO TABS
100.0000 mg | ORAL_TABLET | Freq: Every day | ORAL | Status: DC
  Administered 2022-06-09 – 2022-06-11 (×3): 100 mg via ORAL
  Filled 2022-06-09 (×3): qty 1

## 2022-06-09 MED ORDER — FOLIC ACID 1 MG PO TABS
1.0000 mg | ORAL_TABLET | Freq: Every day | ORAL | Status: DC
  Administered 2022-06-09 – 2022-06-11 (×3): 1 mg via ORAL
  Filled 2022-06-09 (×4): qty 1

## 2022-06-09 MED ORDER — LACTATED RINGERS IV BOLUS
500.0000 mL | Freq: Once | INTRAVENOUS | Status: AC
  Administered 2022-06-09: 500 mL via INTRAVENOUS

## 2022-06-09 MED ORDER — ACETAMINOPHEN 325 MG PO TABS: 650.0000 mg | ORAL_TABLET | Freq: Four times a day (QID) | ORAL | Status: DC | PRN

## 2022-06-09 MED ORDER — IOHEXOL 350 MG/ML SOLN
75.0000 mL | Freq: Once | INTRAVENOUS | Status: AC | PRN
  Administered 2022-06-09: 75 mL via INTRAVENOUS

## 2022-06-09 MED ORDER — THIAMINE HCL 100 MG/ML IJ SOLN
100.0000 mg | Freq: Every day | INTRAMUSCULAR | Status: DC
  Filled 2022-06-09 (×2): qty 2

## 2022-06-09 MED ORDER — ADULT MULTIVITAMIN W/MINERALS CH
1.0000 | ORAL_TABLET | Freq: Every day | ORAL | Status: DC
  Administered 2022-06-09 – 2022-06-11 (×3): 1 via ORAL
  Filled 2022-06-09 (×3): qty 1

## 2022-06-09 MED ORDER — APIXABAN 5 MG PO TABS
5.0000 mg | ORAL_TABLET | Freq: Two times a day (BID) | ORAL | Status: DC
  Administered 2022-06-09 – 2022-06-11 (×5): 5 mg via ORAL
  Filled 2022-06-09 (×5): qty 1

## 2022-06-09 MED ORDER — LORAZEPAM 2 MG/ML IJ SOLN
1.0000 mg | Freq: Once | INTRAMUSCULAR | Status: AC
  Administered 2022-06-09: 1 mg via INTRAVENOUS
  Filled 2022-06-09: qty 1

## 2022-06-09 MED ORDER — LACTATED RINGERS IV BOLUS: 1000.0000 mL | Freq: Once | INTRAVENOUS | Status: DC

## 2022-06-09 MED ORDER — ACETAMINOPHEN 650 MG RE SUPP: 650.0000 mg | Freq: Four times a day (QID) | RECTAL | Status: DC | PRN

## 2022-06-09 MED ORDER — ATORVASTATIN CALCIUM 80 MG PO TABS
80.0000 mg | ORAL_TABLET | Freq: Every day | ORAL | Status: DC
  Administered 2022-06-09 – 2022-06-11 (×3): 80 mg via ORAL
  Filled 2022-06-09: qty 1
  Filled 2022-06-09: qty 2
  Filled 2022-06-09: qty 1

## 2022-06-09 NOTE — Progress Notes (Signed)
Pt admitted to 3W AxOx4, VS wnL and as per flow. Pt oriented to 3W processes. Pt familiar with the [facility] system. All questions and concerns addressed. Daughter bedside. Call bell placed within reach, will continue to monitor and maintain safety.

## 2022-06-09 NOTE — Consult Note (Signed)
Neurology Consultation  Reason for Consult: Code stroke for altered mental status of sudden onset and dysarthria.  Referring Physician: Dr. Raymondo Band, IM resident, teaching service-Dr. Heber [place], attending  CC: Sudden onset of confusion, altered mental status and dysarthria  History is obtained from: Patient, patient's daughter, chart  HPI: Casey [name] is a 52 y.o. female past medical history of coronary artery disease, ischemic cardiomyopathy, LV thrombus on Eliquis, tobacco use, presented for evaluation of a syncopal episode.  Initially evaluated in the ER, preliminary workup unremarkable, being admitted to the internal medicine teaching service when the residents examining the patient noticed a sudden onset of confusion and slurred speech during that encounter with last known well of somewhere around 12:57 AM.  But more history taking from the daughter who came in later, the patient was brought in somewhere around 8 PM, when she was found down on the floor, does not remember the incident and the family thought that she was having a seizure with froth coming out of her mouth.  The daughter reports that the patient drinks half to 1 bottle of vodka every day and she was not sure if she had had anything to drink today or not.  She is also an active smoker and when asked how much she smokes, she said enough. The family has been told that the patient might have had prior strokes without realizing them but they do not know of any deficits that she has from any prior strokes. She does not have a known history of seizures at this time and is on no antiepileptics but the family was concerned that she might have had a seizure today.   LKW: Code stroke was activated with the last known well of 12:57 AM but the patient has not been well since she has been brought to the hospital around 8 PM and was normal sometime prior to that-unclear time. IV thrombolysis given?: no, on Eliquis, nonfocal exam EVT:  No Premorbid modified Rankin scale (mRS):2   ROS: Full ROS was performed and is negative except as noted in the HPI.   Past Medical History:  Diagnosis Date   Anticoagulation adequate 10/14/2015   CAD (coronary artery disease)    Coronary atherosclerosis of native coronary artery, multivessel with plans for CABG to be followed   08/04/2015   Ischemic cardiomyopathy    UTI (urinary tract infection) 10/14/2015     Family History  Problem Relation Age of Onset   Diabetes Mellitus II Neg Hx    CAD Neg Hx      Social History:   reports that she has been smoking cigarettes. She has a 12.50 pack-year smoking history. She has never used smokeless tobacco. She reports current alcohol use. She reports that she does not use drugs.  Medications  Current Facility-Administered Medications:    acetaminophen (TYLENOL) tablet 650 mg, 650 mg, Oral, Q6H PRN **OR** acetaminophen (TYLENOL) suppository 650 mg, 650 mg, Rectal, Q6H PRN, Angelique Blonder, DO   magnesium sulfate IVPB 4 g 100 mL, 4 g, Intravenous, Once, Jimmie Molly, MD, Last Rate: 50 mL/hr at 06/08/22 2333, 4 g at 06/08/22 2333   senna-docusate (Senokot-S) tablet 1 tablet, 1 tablet, Oral, QHS PRN, Angelique Blonder, DO  Current Outpatient Medications:    apixaban (ELIQUIS) 5 MG TABS tablet, Take 1 tablet (5 mg total) by mouth 2 (two) times daily., Disp: 180 tablet, Rfl: 1   atorvastatin (LIPITOR) 80 MG tablet, TAKE 1 TABLET EVERY DAY (Patient taking differently: Take 80 mg by  mouth daily.), Disp: 90 tablet, Rfl: 3   ENTRESTO 24-26 MG, TAKE 1 TABLET TWICE DAILY, Disp: 180 tablet, Rfl: 3   ezetimibe (ZETIA) 10 MG tablet, Take 1 tablet (10 mg total) by mouth daily., Disp: 90 tablet, Rfl: 3   ferrous sulfate 325 (65 FE) MG tablet, Take 1 tablet (325 mg total) by mouth daily with breakfast., Disp: 30 tablet, Rfl: 3   metoprolol succinate (TOPROL-XL) 100 MG 24 hr tablet, TAKE 1 TABLET EVERY DAY, Disp: 90 tablet, Rfl: 3   nitroGLYCERIN (NITROSTAT)  0.4 MG SL tablet, Place 1 tablet (0.4 mg total) under the tongue every 5 (five) minutes as needed for chest pain., Disp: 75 tablet, Rfl: 1   pantoprazole (PROTONIX) 40 MG tablet, TAKE 1 TABLET (40 MG TOTAL) BY MOUTH DAILY., Disp: 90 tablet, Rfl: 10   vitamin B-12 (CYANOCOBALAMIN) 1000 MCG tablet, Take 1 tablet (1,000 mcg total) by mouth daily., Disp: 30 tablet, Rfl: 1   Exam: Current vital signs: BP (!) 166/89   Pulse (!) 112   Temp 97.9 F (36.6 C) (Oral)   Resp 18   Ht '5\' 5"'$  (1.651 m)   Wt 71.2 kg   LMP 09/29/2018 (Exact Date)   SpO2 100%   BMI 26.12 kg/m  Vital signs in last 24 hours: Temp:  [97.7 F (36.5 C)-97.9 F (36.6 C)] 97.9 F (36.6 C) (03/09 0009) Pulse Rate:  [112] 112 (03/08 2230) Resp:  [18-22] 18 (03/08 2230) BP: (166)/(89) 166/89 (03/08 2200) SpO2:  [100 %] 100 % (03/08 2230) Weight:  [71.2 kg] 71.2 kg (03/08 2023) General: Patient was awake alert and answering questions and she was being rolled into the CT, she became more somnolent but then woke up again.  Participated with the exam with poor attention concentration. HEENT: Normocephalic atraumatic Lungs: Clear Cardiovascular: Regular rhythm Abdomen nondistended nontender Neurological exam As said above, initially awake answering questions and then was a little somnolent while being wheeled to the CT scanner and every time a question was asked when she is sleeping, woke up with a startled expression. Was able to follow simple commands Was able to name simple objects consistently Was able to tell me the correct month but got her age wrong. Poor attention concentration No gross aphasia but mildly dysarthric speech. Cranial nerves II to XII intact Motor examination with no drift Sensation intact Coordination intact: No dysmetria in bilateral upper and lower extremities. NIHSS 1a Level of Conscious.: 0 1b LOC Questions: 1 1c LOC Commands: 0 2 Best Gaze: 0 3 Visual: 0 4 Facial Palsy: 0 5a Motor Arm -  left: 0 5b Motor Arm - Right: 0 6a Motor Leg - Left: 0 6b Motor Leg - Right: 0 7 Limb Ataxia: 0 8 Sensory: 0 9 Best Language: 0 10 Dysarthria: 1 11 Extinct. and Inatten.: 0 TOTAL: 2   Labs I have reviewed labs in epic and the results pertinent to this consultation are:  CBC    Component Value Date/Time   WBC 6.6 06/08/2022 2030   RBC 3.55 (L) 06/08/2022 2030   HGB 11.9 (L) 06/08/2022 2330   HGB 11.4 05/02/2022 1020   HCT 35.0 (L) 06/08/2022 2330   HCT 35.1 05/02/2022 1020   PLT 430 (H) 06/08/2022 2030   PLT 368 05/02/2022 1020   MCV 90.4 06/08/2022 2030   MCV 90 05/02/2022 1020   MCH 30.1 06/08/2022 2030   MCHC 33.3 06/08/2022 2030   RDW 19.3 (H) 06/08/2022 2030   RDW 16.5 (H) 05/02/2022  1020   LYMPHSABS 1.5 06/08/2022 2030   MONOABS 0.3 06/08/2022 2030   EOSABS 0.0 06/08/2022 2030   BASOSABS 0.1 06/08/2022 2030    CMP     Component Value Date/Time   NA 130 (L) 06/08/2022 2330   NA 143 05/02/2022 1020   K 3.1 (L) 06/08/2022 2330   CL 91 (L) 06/08/2022 2140   CO2 22 06/08/2022 2140   GLUCOSE 110 (H) 06/08/2022 2140   BUN 5 (L) 06/08/2022 2140   BUN 6 05/02/2022 1020   CREATININE 0.73 06/08/2022 2140   CREATININE 0.61 10/24/2015 0903   CALCIUM 9.1 06/08/2022 2140   PROT 7.7 06/08/2022 2140   PROT 7.7 05/02/2022 1020   ALBUMIN 3.7 06/08/2022 2140   ALBUMIN 4.4 05/02/2022 1020   AST 30 06/08/2022 2140   ALT 14 06/08/2022 2140   ALKPHOS 65 06/08/2022 2140   BILITOT 0.9 06/08/2022 2140   BILITOT <0.2 05/02/2022 1020   GFRNONAA >60 06/08/2022 2140   GFRAA 115 08/13/2019 0917    Lipid Panel     Component Value Date/Time   CHOL 151 05/02/2022 1020   TRIG 131 05/02/2022 1020   HDL 48 05/02/2022 1020   CHOLHDL 3.1 05/02/2022 1020   CHOLHDL 2.6 10/14/2015 0357   VLDL 13 10/14/2015 0357   LDLCALC 80 05/02/2022 1020   Lactate evaded to 7 Alcohol level undetectable UDS negative  Imaging I have reviewed the images obtained:  CT-head-no acute  intracranial pathology.  Aspects 10.  No bleed.  CT angio head and neck with no emergent LVO   Assessment: 52 year old with an extensive cardiovascular history including that of an LV thrombus on apixaban, ischemic cardiomyopathy and systolic CHF, presenting for evaluation of initially what was thought to be syncopal episode but on further history taking I suspect that she might have had a seizure.  She has an extensive history of alcohol abuse and her alcohol level was undetectable. Alcohol withdrawal seizure versus syncope versus toxic metabolic encephalopathy are in the differentials at this time.  A small stroke can also not be ruled out but she did not have any focal findings and her last known well was rather unclear as well with her being on anticoagulation precluded her from IV thrombolysis.  CT angiography head and neck did not show any evidence of LVO for endovascular thrombectomy..  Impression: Evaluate for alcohol withdrawal seizure versus syncope Evaluate for toxic metabolic encephalopathy Low on the differential, evaluate for stroke.  Recommendations: Frequent neurochecks Telemetry EEG Ativan 1 mg IV x 1 Loaded with Keppra 2 g IV x 1 Has a pacemaker-may not get MRI till the start of the next week but I would recommend checking with radiology to see if they can get an MRI done if the pacemaker is compatible. If cannot get an MRI within the next 12 hours, I would repeat a head CT to make sure there is no large evolving infarct. CIWA protocol Check ammonia level Seizure precautions No large evolving infarct-can continue home Eliquis. Pacemaker interrogation Syncope workup including echocardiogram per primary team.  Neurology will follow with you.  -- Amie Portland, MD Neurologist [facility] Pager: [phone]

## 2022-06-09 NOTE — Discharge Instructions (Addendum)
Dear Casey [name], it has been a pleasure caring for you and I am so happy to see you doing well! You were hospitalized for a fall and treated for possible seizure and stroke. Since then you have recovered, you are no longer experiencing dizziness, nausea, and vomiting. We also continued a lot of your home medications to manage your heart disease and high cholesterol. In addition to medications, you were also seen by the neurologist.   When you are discharged we would like you to do the following:   1. Do not continue taking Entresto and discussing restarting the medication in your upcoming PCP appointment. Continue taking the rest of the medications as you were before you came to the hospital.  2. Follow-up with your PCP on 06/19/2022 at 3:45 pm. Please call [phone] for the IM clinic. Address of the clinic is [address]

## 2022-06-09 NOTE — Procedures (Signed)
Patient Name: Casey [name]  MRN: [id number]  Epilepsy Attending: Lora Havens  Referring Physician/Provider: Amie Portland, MD  Date: 06/09/2022  Duration: 22.56 mins  Patient history: 52 year old with an extensive cardiovascular history including 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 EEG to evaluate for seizure  Level of alertness: Awake, asleep  AEDs during EEG study: None  Technical aspects: This EEG study was done with scalp electrodes positioned according to the 10-20 International system of electrode placement. Electrical activity was reviewed with band pass filter of 1-'70Hz'$ , sensitivity of 7 uV/mm, display speed of 75m/sec with a '60Hz'$  notched filter applied as appropriate. EEG data were recorded continuously and digitally stored.  Video monitoring was available and reviewed as appropriate.  Description: The posterior dominant rhythm consists of 8 Hz activity of moderate voltage (25-35 uV) seen predominantly in posterior head regions, symmetric and reactive to eye opening and eye closing. Sleep was characterized by vertex waves, sleep spindles (12 to 14 Hz), maximal frontocentral region. Hyperventilation and photic stimulation were not performed.    EKG artifact was noted throughout the study.   IMPRESSION: This study is within normal limits. No seizures or epileptiform discharges were seen throughout the recording.  A normal interictal EEG does not exclude the diagnosis of epilepsy.  Allesandra Huebsch OBarbra Sarks

## 2022-06-09 NOTE — Progress Notes (Signed)
Stat  EEG complete - results pending.  

## 2022-06-09 NOTE — Discharge Summary (Incomplete)
Name: Casey [name] MRN: [id number] DOB: 1970/12/27 52 y.o. PCP: Patient, No Pcp Per  Date of Admission: 06/08/2022  8:17 PM Date of Discharge: 06/11/2022 Attending Physician: Dr. Angelia Mould  Discharge Diagnosis: Principal Problem:   Syncope, unspecified syncope type Active Problems:   Hyponatremia    Discharge Medications: Allergies as of 06/11/2022       Reactions   Bempedoic Acid Other (See Comments)   hallucinations        Medication List     STOP taking these medications    cyanocobalamin 1000 MCG tablet Commonly known as: VITAMIN B12   Entresto 24-26 MG Generic drug: sacubitril-valsartan   ferrous sulfate 325 (65 FE) MG tablet   nitroGLYCERIN 0.4 MG SL tablet Commonly known as: NITROSTAT       TAKE these medications    apixaban 5 MG Tabs tablet Commonly known as: Eliquis Take 1 tablet (5 mg total) by mouth 2 (two) times daily.   atorvastatin 80 MG tablet Commonly known as: LIPITOR TAKE 1 TABLET EVERY DAY   ezetimibe 10 MG tablet Commonly known as: ZETIA Take 1 tablet (10 mg total) by mouth daily.   metoprolol succinate 100 MG 24 hr tablet Commonly known as: TOPROL-XL TAKE 1 TABLET EVERY DAY   nicotine 21 mg/24hr patch Commonly known as: NICODERM CQ - dosed in mg/24 hours Place 1 patch (21 mg total) onto the skin daily as needed (craving).   pantoprazole 40 MG tablet Commonly known as: PROTONIX TAKE 1 TABLET (40 MG TOTAL) BY MOUTH DAILY.        Disposition and follow-up:   Casey [name] was discharged from [facility] in Stable condition.  At the hospital follow up visit please address:  1.  Follow-up:  a. HLD - On atorvastatin. Consider check lipid panel and adjust medication as appropriate.    b. Syncope - Recently hospitalized due to syncope, cardiac etiology vs. Alcohol withdrawal. Evaluate symptom improvement.    c. Alcohol use disorder - Assess interested in stopping/decreasing alcohol use or  medications to assist.   2.  Labs / imaging needed at time of follow-up: none  3.  Pending labs/ test needing follow-up: none  Follow-up Appointments:  PCP appointment on 06/19/2022 at 3:45 pm. Cardiology appointment on 06/25/2022.  Hospital Course by problem list: Syncope- cardiogenic etiology vs alcoholic withdrawal seizures The patient had a syncopal episode around 7:30 PM on 3/8, witnessed by her daughter and grandsons. Patient did not have any prodromal symptoms prior to this episode. Patient was frothing at the mouth and patient's family thought that she may have had a seizure, although she does not have a history of seizures. Patient's daughter notes that the patient drinks half to one bottle of vodka everyday and they are unsure when her last drink was. Ethanol level undetectable in the ED so withdrawal symptoms from alcohol is likely. Patient's has a history of ischemic cardiomyopathy and an LV thrombus so cardiac etiology of her syncope is also possible. EEG showed no seizures and repeat CT head showed no acute abnormalities.  Alcohol Use Disorder Patient drinks about one bottle of Vodka daily. Patient is in the pre-contemplative stage of discontinuing alcohol. Discussed with her the importance of abstinence, she can discuss medication options in her follow-up appointment with her PCP on 06/19/2022.   Acute onset dysarthria and mental status change Code stroke called as the patient acutely became dysarthric upon initial evaluation. CBG 139 during this episode. By the time neurology  arrived at the bedside, the patient's speech had improved, however, with her history of an LV thrombus, she is certainly high risk for a stroke and is also high risk of having an intracranial bleed, given that she is on eliquis and hit her head after her syncopal episode earlier. CT head was negative for any intracranial abnormality and CT angio head/neck was negative for intracranial large vessel occlusion or  significant stenosis. MRI brain was negative.   Ischemic cardiomyopathy s/p ICD CAD HLD Cath in 10/2015 showed 70% ramus, 50% followed by 55% LAD, and occluded RCA. Last EF 30-35% in 2020 with concentric LVH and impaired relaxation. She underwent ICD implantation in June 2020. The patient is on metoprolol XL 100 mg daily and Entresto 50 mg bid at home. Also on atorvastatin 80 mg daily. Continued atorvastatin and metoprolol. Held Royal at discharge and will follow-up in the clinic on 3/19 to discuss restarting the medication.   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 Resumed home Eliquis.   Subjective:  Patient evaluated at bedside this AM.She denies any issues this morning, slept and ate well overnight.  Discharge Vitals:   BP 138/84 (BP Location: Right Arm)   Pulse 88   Temp 98 F (36.7 C)   Resp 14   Ht '5\' 5"'$  (1.651 m)   Wt 71.2 kg   LMP 09/29/2018 (Exact Date)   SpO2 100%   BMI 26.12 kg/m   Discharge exam: General: Pleasant, well-appearing  in bed. No acute distress. Urinated in bed. CV: RRR. No murmurs, rubs, or gallops. No LE edema Pulmonary: Lungs CTAB. Normal effort. No wheezing or rales. Abdominal: Soft, nontender, nondistended.  Skin: Warm and dry. No obvious rash or lesions.   Neuro: Mental Status -  A&O x 3. Language including expression, naming and repetition was assessed and found intact. Improving insight on what brought her to the hospital.   Cranial Nerves II - XII - II - Visual field intact OU. III, IV, VI - Extraocular movements intact. No nystagmus noted but has difficulty with eye tracking V - Facial sensation intact bilaterally. VII - Facial movement intact bilaterally. VIII - Hearing & vestibular intact bilaterally. X - Palate elevates symmetrically. XI - Chin turning & shoulder shrug intact bilaterally. XII - Tongue protrusion intact.   Cerebellar - Finger to  nose exam intact Heal to shin exam intact   Motor Strength - The patient's strength was 5/5 in all extremities. Bulk was normal and fasciculations were absent.   Motor Tone - Muscle tone was assessed at the neck and appendages and was normal.   Sensory - Light touch assessed and symmetrical.   Pertinent Labs, Studies, and Procedures:     Latest Ref Rng & Units 06/09/2022    2:02 AM 06/09/2022    1:55 AM 06/08/2022   11:30 PM  CBC  WBC 4.0 - 10.5 K/uL  8.7    Hemoglobin 12.0 - 15.0 g/dL 11.2  10.0  11.9   Hematocrit 36.0 - 46.0 % 33.0  28.9  35.0   Platelets 150 - 400 K/uL  292         Latest Ref Rng & Units 06/10/2022   11:37 AM 06/10/2022    7:34 AM 06/09/2022    8:00 AM  CMP  Glucose 70 - 99 mg/dL  93  97   BUN 6 - 20 mg/dL  7  <5  Creatinine 0.44 - 1.00 mg/dL  0.74  0.72   Sodium 135 - 145 mmol/L  134  129   Potassium 3.5 - 5.1 mmol/L 4.0  2.8  3.7   Chloride 98 - 111 mmol/L  98  96   CO2 22 - 32 mmol/L  23  22   Calcium 8.9 - 10.3 mg/dL  9.9  9.0     EEG adult  Result Date: 06/09/2022 Lora Havens, MD     06/09/2022  8:01 AM Patient Name: Casey [name] MRN: [id number] Epilepsy Attending: Lora Havens Referring Physician/Provider: Amie Portland, MD Date: 06/09/2022 Duration: 22.56 mins Patient history: 52 year old with an extensive cardiovascular history including 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 EEG to evaluate for seizure Level of alertness: Awake, asleep AEDs during EEG study: None Technical aspects: This EEG study was done with scalp electrodes positioned according to the 10-20 International system of electrode placement. Electrical activity was reviewed with band pass filter of 1-'70Hz'$ , sensitivity of 7 uV/mm, display speed of 44m/sec with a '60Hz'$  notched filter applied as appropriate. EEG data were recorded continuously and digitally stored.   Video monitoring was available and reviewed as appropriate. Description: The posterior dominant rhythm consists of 8 Hz activity of moderate voltage (25-35 uV) seen predominantly in posterior head regions, symmetric and reactive to eye opening and eye closing. Sleep was characterized by vertex waves, sleep spindles (12 to 14 Hz), maximal frontocentral region. Hyperventilation and photic stimulation were not performed.  EKG artifact was noted throughout the study. IMPRESSION: This study is within normal limits. No seizures or epileptiform discharges were seen throughout the recording. A normal interictal EEG does not exclude the diagnosis of epilepsy. Priyanka OBarbra Sarks  CT ANGIO HEAD NECK W WO CM (CODE STROKE)  Result Date: 06/09/2022 CLINICAL DATA:  Altered mental status cough, stroke suspected EXAM: CT ANGIOGRAPHY HEAD AND NECK TECHNIQUE: Multidetector CT imaging of the head and neck was performed using the standard protocol during bolus administration of intravenous contrast. Multiplanar CT image reconstructions and MIPs were obtained to evaluate the vascular anatomy. Carotid stenosis measurements (when applicable) are obtained utilizing NASCET criteria, using the distal internal carotid diameter as the denominator. RADIATION DOSE REDUCTION: This exam was performed according to the departmental dose-optimization program which includes automated exposure control, adjustment of the mA and/or kV according to patient size and/or use of iterative reconstruction technique. CONTRAST:  78mOMNIPAQUE IOHEXOL 350 MG/ML SOLN COMPARISON:  No prior CTA available, correlation is made with CT head 06/09/2022 FINDINGS: CT HEAD FINDINGS For noncontrast findings, please see same day CT head. CTA NECK FINDINGS Aortic arch: Standard branching. Imaged portion shows no evidence of aneurysm or dissection. No significant stenosis of the major arch vessel origins. Mild aortic atherosclerosis. Right carotid system: No evidence of  dissection, occlusion, or hemodynamically significant stenosis (greater than 50%). Atherosclerotic disease at the bifurcation and in the proximal ICA is not hemodynamically significant. Left carotid system: No evidence of dissection, occlusion, or hemodynamically significant stenosis (greater than 50%). Atherosclerotic disease at the bifurcation and in the proximal ICA is not hemodynamically significant. Vertebral arteries: Evaluation of the proximal left vertebral artery is limited by beam hardening artifact from adjacent contrast bolus. Within this limitation, no evidence of dissection, occlusion, or hemodynamically significant stenosis (greater than 50%). Skeleton: No acute osseous abnormality. Other neck: No acute finding. Upper chest: No focal pulmonary opacity  or pleural effusion. Review of the MIP images confirms the above findings CTA HEAD FINDINGS Anterior circulation: Both internal carotid arteries are patent to the termini, without significant stenosis. A1 segments patent. Normal anterior communicating artery. Anterior cerebral arteries are patent to their distal aspects. No M1 stenosis or occlusion. MCA branches perfused and symmetric. Posterior circulation: Vertebral arteries patent to the vertebrobasilar junction without stenosis. Posterior inferior cerebellar arteries patent proximally. Basilar patent to its distal aspect. Superior cerebellar arteries patent proximally. Patent P1 segments. PCAs perfused to their distal aspects without stenosis. The bilateral posterior communicating arteries are patent. Venous sinuses: As permitted by contrast timing, patent. Anatomic variants: None significant. Review of the MIP images confirms the above findings IMPRESSION: 1. No intracranial large vessel occlusion or significant stenosis. 2. No hemodynamically significant stenosis in the neck. 3. Aortic atherosclerosis. Aortic Atherosclerosis (ICD10-I70.0). Imaging results were communicated on 06/09/2022 at 1:52 am to  provider Dr. Rory Percy via secure text paging. Electronically Signed   By: Merilyn Baba M.D.   On: 06/09/2022 01:52   CT HEAD CODE STROKE WO CONTRAST  Result Date: 06/09/2022 CLINICAL DATA:  Code stroke.  Altered mental status EXAM: CT HEAD WITHOUT CONTRAST TECHNIQUE: Contiguous axial images were obtained from the base of the skull through the vertex without intravenous contrast. RADIATION DOSE REDUCTION: This exam was performed according to the departmental dose-optimization program which includes automated exposure control, adjustment of the mA and/or kV according to patient size and/or use of iterative reconstruction technique. COMPARISON:  06/08/2022 FINDINGS: Brain: No evidence of acute infarction, hemorrhage, mass, mass effect, or midline shift. No hydrocephalus or extra-axial collection. Vascular: No hyperdense vessel. Skull: Negative for fracture or focal lesion. Sinuses/Orbits: No acute finding. Other: The mastoid air cells are well aerated. ASPECTS [facility] Stroke Program Early CT Score) - Ganglionic level infarction (caudate, lentiform nuclei, internal capsule, insula, M1-M3 cortex): 7 - Supraganglionic infarction (M4-M6 cortex): 3 Total score (0-10 with 10 being normal): 10 IMPRESSION: No evidence of acute intracranial abnormality. ASPECTS is 10. Imaging results were communicated on 06/09/2022 at 1:23 am to provider Dr. Rory Percy via secure text paging. Electronically Signed   By: Merilyn Baba M.D.   On: 06/09/2022 01:23   CT Head Wo Contrast  Result Date: 06/08/2022 CLINICAL DATA:  Trauma. EXAM: CT HEAD WITHOUT CONTRAST CT CERVICAL SPINE WITHOUT CONTRAST TECHNIQUE: Multidetector CT imaging of the head and cervical spine was performed following the standard protocol without intravenous contrast. Multiplanar CT image reconstructions of the cervical spine were also generated. RADIATION DOSE REDUCTION: This exam was performed according to the departmental dose-optimization program which includes automated  exposure control, adjustment of the mA and/or kV according to patient size and/or use of iterative reconstruction technique. COMPARISON:  Head CT dated 07/18/2021. FINDINGS: CT HEAD FINDINGS Brain: The ventricles and sulci are appropriate size for the patient's age. The gray-white matter discrimination is preserved. There is no acute intracranial hemorrhage. No mass effect or midline shift. No extra-axial fluid collection. Vascular: No hyperdense vessel or unexpected calcification. Skull: Normal. Negative for fracture or focal lesion. Sinuses/Orbits: No acute finding. Other: None CT CERVICAL SPINE FINDINGS Alignment: No acute subluxation. Skull base and vertebrae: No acute fracture. Soft tissues and spinal canal: No prevertebral fluid or swelling. No visible canal hematoma. Disc levels:  No acute findings.  Mild degenerative changes. Upper chest: Negative. Other: Partially visualized wires in the left chest. IMPRESSION: 1. No acute intracranial pathology. 2. No acute/traumatic cervical spine pathology. Electronically Signed   By: Anner Crete  M.D.   On: 06/08/2022 22:44   CT Cervical Spine Wo Contrast  Result Date: 06/08/2022 CLINICAL DATA:  Trauma. EXAM: CT HEAD WITHOUT CONTRAST CT CERVICAL SPINE WITHOUT CONTRAST TECHNIQUE: Multidetector CT imaging of the head and cervical spine was performed following the standard protocol without intravenous contrast. Multiplanar CT image reconstructions of the cervical spine were also generated. RADIATION DOSE REDUCTION: This exam was performed according to the departmental dose-optimization program which includes automated exposure control, adjustment of the mA and/or kV according to patient size and/or use of iterative reconstruction technique. COMPARISON:  Head CT dated 07/18/2021. FINDINGS: CT HEAD FINDINGS Brain: The ventricles and sulci are appropriate size for the patient's age. The gray-white matter discrimination is preserved. There is no acute intracranial  hemorrhage. No mass effect or midline shift. No extra-axial fluid collection. Vascular: No hyperdense vessel or unexpected calcification. Skull: Normal. Negative for fracture or focal lesion. Sinuses/Orbits: No acute finding. Other: None CT CERVICAL SPINE FINDINGS Alignment: No acute subluxation. Skull base and vertebrae: No acute fracture. Soft tissues and spinal canal: No prevertebral fluid or swelling. No visible canal hematoma. Disc levels:  No acute findings.  Mild degenerative changes. Upper chest: Negative. Other: Partially visualized wires in the left chest. IMPRESSION: 1. No acute intracranial pathology. 2. No acute/traumatic cervical spine pathology. Electronically Signed   By: Anner Crete M.D.   On: 06/08/2022 22:44     Discharge Instructions:     Discharge Instructions      Dear Ms. Crowson, it has been a pleasure caring for you and I am so happy to see you doing well! You were hospitalized for a fall and treated for possible seizure and stroke. Since then you have recovered, you are no longer experiencing dizziness, nausea, and vomiting. We also continued a lot of your home medications to manage your heart disease and high cholesterol. In addition to medications, you were also seen by the neurologist.   When you are discharged we would like you to do the following:   1. Do not continue taking Entresto and discussing restarting the medication in your upcoming PCP appointment. Continue taking the rest of the medications as you were before you came to the hospital.  2. Follow-up with your PCP on 06/19/2022 at 3:45 pm. Please call [phone] for the IM clinic. Address of the clinic is [address]    Signed: Alesia Morin, MD 06/11/2022, 1:58 PM   Pager: [phone]

## 2022-06-09 NOTE — Progress Notes (Signed)
Pt is being transported to ct and admitted will attempt after.

## 2022-06-09 NOTE — Progress Notes (Signed)
NAME:  Casey [name], MRN:  [id number], DOB:  09-11-1970, LOS: 0 ADMISSION DATE:  06/08/2022  Subjective  NAEON  Patient evaluated at bedside this AM. She reports falling Friday night and denies having prodromal symptoms prior to the fall including palpitations, chest pain, or dizziness. She attributes the fall to mistepping. She notes last drink was on Friday. She typically drinks two cups of liquor daily. Denies alcohol withdrawal symptoms in the past. She currently denies HA, N/V, and dysarthria. She states last night, she was dysarthric because she was woken up at night.     Objective   Blood pressure 131/88, pulse (!) 115, temperature 99.1 F (37.3 C), temperature source Oral, resp. rate 16, height '5\' 5"'$  (1.651 m), weight 71.2 kg, last menstrual period 09/29/2018, SpO2 98 %.     Intake/Output Summary (Last 24 hours) at 06/09/2022 0951 Last data filed at 06/09/2022 0240 Gross per 24 hour  Intake 1092.41 ml  Output --  Net 1092.41 ml   Filed Weights   06/08/22 2023  Weight: 71.2 kg   Physical Exam: General: Pleasant, well-appearing  in bed. No acute distress. Urinated in bed. CV: RRR. No murmurs, rubs, or gallops. No LE edema Pulmonary: Lungs CTAB. Normal effort. No wheezing or rales. Abdominal: Soft, nontender, nondistended.  Skin: Warm and dry. No obvious rash or lesions.  Neuro: Mental Status -  A&O x 2, knew her name and place but did not know the month, year, and age Language including expression, naming and repetition was assessed and found intact. Limited insight on what brought her to the hospital.   Cranial Nerves II - XII - II - Visual field intact OU. III, IV, VI - Extraocular movements intact. No nystagmus noted but has difficulty with eye tracking V - Facial sensation intact bilaterally. VII - Facial movement intact bilaterally. VIII - Hearing & vestibular intact bilaterally. X - Palate elevates symmetrically. XI - Chin turning & shoulder shrug intact  bilaterally. XII - Tongue protrusion intact.   Cerebellar - Finger to nose exam has tremor Heal to shin exam intact  Motor Strength - The patient's strength was normal in RUE and LUE. Lower extremity strength 4/5. Bulk was normal and fasciculations were absent.   Motor Tone - Muscle tone was assessed at the neck and appendages and was normal.   Sensory - Light touch assessed and symmetrical.      Labs       Latest Ref Rng & Units 06/09/2022    2:02 AM 06/09/2022    1:55 AM 06/08/2022   11:30 PM  CBC  WBC 4.0 - 10.5 K/uL  8.7    Hemoglobin 12.0 - 15.0 g/dL 11.2  10.0  11.9   Hematocrit 36.0 - 46.0 % 33.0  28.9  35.0   Platelets 150 - 400 K/uL  292        Latest Ref Rng & Units 06/09/2022    8:00 AM 06/09/2022    2:02 AM 06/08/2022   11:30 PM  BMP  Glucose 70 - 99 mg/dL 97  113    BUN 6 - 20 mg/dL <5  <3    Creatinine 0.44 - 1.00 mg/dL 0.72  0.40    Sodium 135 - 145 mmol/L 129  125  130   Potassium 3.5 - 5.1 mmol/L 3.7  4.5  3.1   Chloride 98 - 111 mmol/L 96  91    CO2 22 - 32 mmol/L 22     Calcium 8.9 - 10.3  mg/dL 9.0       Summary  Casey [name] is a 52 yo female with a hx of LV thrombus on Eliquis, ischemic cardiomyopathy (EF 30-35% in 2020), HLD, and multivessel CAD (with future plans for CABG) who presents to [facility] after a syncopal episode.  Assessment & Plan:  Principal Problem:   Syncope, unspecified syncope type   Casey [name] is a 52 y.o. person living with a history of LV thrombus on Eliquis, ischemic cardiomyopathy (EF 30-35% in 2020), HLD, and multivessel CAD who presented with syncope and admitted for syncope (possible cardiogenic etiology) and stroke rule out.   Syncope, possibly cardiogenic etiology vs seizure Lactic acidemia 2/2 to above Today, patient is still partially alert and oriented but seems to be improving. Alcohol withdrawal seizure likely as she has an extensive alcohol history although alcohol level on arrival was <10. Other  possible causes of syncope include cardiac etiology given the patient's history of ischemic cardiomyopathy and an LV thrombus.Lactic acid elevated 7.9, and repeat lactate improved to 2.2. EEG negative for seizures. - appreciate neurology assistance and recommendations - f/u blood cultures  - f/u echocardiogram - Continue cardiac monitoring - PT/OT - Seizure precautions    Code stroke Acute onset dysarthria and mental status change Code stroke called as the patient acutely became dysarthric upon initial evaluation. CT head was negative for any intracranial abnormality and CT angio head/neck was negative for intracranial large vessel occlusion or significant stenosis. She has history of LV apical thrombus, okay to resume home Eliquis per neurology. Today patient's speech is of normal fluency, tone, and volume. - Continue home Eliquis - F/u Echo - UDS pending - Repeat CT head 12 hrs after initial episode (1300 on 3/9) - Frequent neuro checks    Alcohol use disorder Patient's daughter notes that the patient drinks half to one bottle of vodka everyday and they are unsure when her last drink was. Ethanol level undetectable in the ED, thus, alcohol withdrawal is on the differential as the etiology of her seizure vs syncope. - CIWA monitoring without ativan  - Thiamine and folic acid  - MVI    Hyponatremia Trending: 130>125>129. Patient does not appear volume overloaded. Patient does have history of alcohol use but hyponatremia appears acute. No recent n/v/d.  - monitor BMP - check serum osmolality along with urine osm and Na   Ischemic cardiomyopathy s/p ICD CAD HLD She underwent ICD implantation in June 2020. The patient is on metoprolol XL 100 mg daily and Entresto 50 mg bid. Also on atorvastatin 80 mg daily.  - continue home atorvastatin - continue home metoprolol - hold Entresto for now since recent BP normotensive, restart if BP elevated     Diet: Heart Healthy   VTE:  DOAC IVF:  None,None Code: Full   Prior to Admission Living Arrangement: Home, living alone Anticipated Discharge Location:  Pending Barriers to Discharge: Medical work up and stability   Dispo: Admit patient to Observation with expected length of stay less than 2 midnights.   Stormy Fabian, MD Internal Medicine Resident PGY-1 PAGER: [phone] 06/09/2022 9:51 AM  If after hours (below), please contact on-call pager: [phone] 5PM-7AM Monday-Friday 1PM-7AM Saturday-Sunday

## 2022-06-09 NOTE — ED Notes (Signed)
CARELINK called to activate Stroke @ 0100

## 2022-06-09 NOTE — Code Documentation (Addendum)
Responded to Code Stroke called on pt already in the ED for syncopy. Code Stroke called at 0100 for AMS, LSN-unknown, NIH-3, CT head negative for acute changes. CTA-no LVO. TNK not given-exam nonfocal. Plan: Ativan/Keppra, MRI, VS/neuro checks q2h x 12, then q4h.

## 2022-06-09 NOTE — ED Notes (Signed)
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 collection. Vascular: No hyperdense vessel or unexpected calcification. Skull: Normal. Negative for fracture or focal lesion. Sinuses/Orbits: No acute finding. Other: None CT CERVICAL SPINE FINDINGS Alignment: No acute subluxation. Skull base and vertebrae: No acute fracture. Soft tissues and spinal canal: No prevertebral fluid or swelling. No visible canal hematoma. Disc levels:  No acute findings.  Mild degenerative changes. Upper chest: Negative. Other: Partially visualized wires in the left chest. IMPRESSION: 1. No acute intracranial pathology. 2. No acute/traumatic cervical spine pathology. Electronically Signed   By: Anner Crete M.D.   On: 06/08/2022 22:44     Pending Labs Unresulted Labs (From admission, onward)     Start     Ordered   06/09/22 0550  Osmolality, urine  Once,   R        06/09/22 0553  06/09/22 0550  Sodium, urine, random  Once,   R        06/09/22 0553   06/09/22 0351  Ammonia  Add-on,   AD        06/09/22 0350   06/09/22 0115  Urine rapid drug screen (hosp performed)  Once,   R        06/09/22 0114            Vitals/Pain Today's Vitals   06/09/22 0930 06/09/22 0933 06/09/22 1150 06/09/22 1151  BP: 125/86 131/88    Pulse: (!) 111 (!) 115    Resp: 16     Temp:      TempSrc:      SpO2: 98%     Weight:      Height:      PainSc:   0-No pain 0-No pain    Isolation Precautions No active isolations  Medications Medications  acetaminophen (TYLENOL) tablet 650 mg (has no administration in time range)    Or  acetaminophen (TYLENOL) suppository 650 mg (has no administration in time range)  senna-docusate (Senokot-S) tablet 1 tablet (has no administration in time range)  thiamine (VITAMIN B1) tablet 100 mg (100 mg Oral Given 06/09/22 0933)    Or  thiamine (VITAMIN B1) injection 100 mg ( Intravenous See Alternative [id number])  folic acid (FOLVITE) tablet 1 mg (1 mg Oral Given 06/09/22 0934)  multivitamin with minerals tablet 1 tablet (1 tablet Oral Given 06/09/22 0933)  apixaban (ELIQUIS) tablet 5 mg (5 mg Oral Given 06/09/22 0934)  atorvastatin (LIPITOR) tablet 80 mg (80 mg Oral Given 06/09/22 0933)  metoprolol succinate (TOPROL-XL) 24 hr tablet 100 mg (100 mg Oral Given 06/09/22 0933)  potassium chloride SA (KLOR-CON M) CR tablet 40 mEq (40 mEq Oral Given 06/08/22 2335)  lactated ringers bolus 500 mL (0 mLs Intravenous Stopped 06/09/22 0008)  thiamine (VITAMIN B1) injection 100 mg (100 mg Intravenous Given 06/08/22 2333)  magnesium sulfate IVPB 4 g 100 mL (0 g Intravenous Stopped 06/09/22 0133)  lactated ringers bolus 500 mL (0 mLs Intravenous Stopped 06/09/22 0153)  iohexol (OMNIPAQUE) 350 MG/ML injection 75 mL (75 mLs  Intravenous Contrast Given 06/09/22 0133)  LORazepam (ATIVAN) injection 1 mg (1 mg Intravenous Given 06/09/22 0205)  levETIRAcetam (KEPPRA) IVPB 1000 mg/100 mL premix (0 mg Intravenous Stopped 06/09/22 0240)    Mobility walks     Focused Assessments musculoskeletal   R Recommendations: See Admitting Provider Note  Report given to:   Additional Notes: pt can walk, but seems unsteady. She need one assist. She is in CT now. She needs a Echo they will do it portable she she get to the floor.

## 2022-06-09 NOTE — H&P (Addendum)
Date: 06/09/2022               Patient Name:  Casey [name] MRN: [id number]  DOB: [birth year] Age / Sex: 52 y.o., female   PCP: Patient, No Pcp Per         Medical Service: Internal Medicine Teaching Service         Attending Physician: Dr. Lucious Groves, DO      First Contact: Dr. Johny Blamer, DO Pager [phone]    Second Contact: Dr. 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  Around 7:30 PM the patient went to sit on her bed, but instead just passed out while she was still standing. She fell between the bed and the nightstand (hit her head) and her grandsons called 911 (they witnessed the episode along with the patient's daughter). She did not remember the episode when she woke up and also denied having any symptoms prior to the episode, but did wake up with a headache. Specifically, she denied any dizziness, lightheadedness, chest pain, palpitations, or diaphoresis prior to the episode. The patient stated she just remembers waking up with EMS standing over her. The patient's daughter notes that she thought her mother was having a seizure at the time she passed out, as there was froth coming out of her mouth.   Right before this, the patient's daughter (on the phone with [place]) notes that patient possibly drinking liquor and beer. Her family noted that she was shaking a little bit prior to the episode, as well. Their house also had the [facility] off and the patient was noted to be hot and sweaty at this time, per her daughter, although the patient denies feeling this way.   The patient  was "out of it" after the episode and was laying on the ground. Her daughter notes that she was passed out for a few minutes and denies any tongue bites/lacerations, or any incontinence. The patient feels like she is back to normal now and wants to go home - denies fevers, chills, chest pain, SOB, and pain, n/v/d, or urinary symptoms. She also denies any recent illness, medication changes, travel, surgeries.   Of note, as we finished obtaining our history and performing our physical exam, the patient was noted to become severely dysarthric, with non-intelligible speech. She was only able to state her first name, but would not state her full name and no longer knew she was in the hospital, also was not oriented to time. IMTS called a code stroke at the bedside. The patient's speech did improve prior to neurology arriving to the bedside.  ED Course: Arrived by EMS. Hypertensive 166/89, tachycardic 110s, RR 22, afebrile, SpO2 100% on RA.  Labs noted Na 130, K 3.4, Magnesium 1.1. Initial CBC showed hemoglobin 10.7 Troponin elevated but flat 28-29. UDS negative and Ethanol <10. Proteinuria on UA. CT head and c-spine without signs of acute hemorrhage or fracture.   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  -HLD -Multivessel CAD -Ischemic cardiomyopathy (EF 30-35%) s/p ICD  Meds:  -Eliquis  5 mg bid  -Lipitor 80 mg  -Entresto 50 mg bid  -Metoprolol 100 mg  Past Surgical History Past Surgical History:  Procedure Laterality Date   CARDIAC CATHETERIZATION N/A 08/04/2015   Procedure: Coronary/Graft Angiography;  Surgeon: Peter M Martinique, MD;  Location: [place] [facility];  Service: Cardiovascular;  Laterality: N/A;   CARDIAC CATHETERIZATION N/A 10/10/2015   Procedure: Left Heart Cath and Coronary Angiography;  Surgeon: Troy Sine, MD;  Location: [place] [facility];  Service: Cardiovascular;  Laterality: N/A;   CESAREAN SECTION     ICD IMPLANT N/A 09/29/2018   Procedure: ICD IMPLANT;  Surgeon:  Evans Lance, MD;  Location: [place] [facility];  Service: Cardiovascular;  Laterality: N/A;   Social:  Lives alone in [place] 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   OBJECTIVE:   Physical Exam: Blood pressure (!) 166/89, pulse (!) 112, temperature 97.9 F (36.6 C), temperature source Oral, resp. rate 18, height '5\' 5"'$  (1.651 m), weight 71.2 kg, last menstrual period 09/29/2018, SpO2 100 % on RA.  Constitutional: awake but at times drowsy, lying in bed, in no acute distress HENT: normocephalic atraumatic Eyes: PERRL Cardiovascular: tachycardic, normal rhythm Pulmonary/Chest: normal work of breathing on room air, lungs clear to auscultation bilaterally Abdominal: soft, non-distended MSK: normal bulk and tone Neurological: (completed after code stroke called, exam performed at 0300) alert & oriented x 4, symmetric smile, able to protrude tongue and shrug shoulders, sensation intact bilaterally, 5/5 strength bilateral UE and 4/5 strength LE, heel-to-shin intact bilaterally, finger-to-nose slowed but possibly due to somnolence, intact repetition of "no ifs, ands, or buts" Skin: warm and dry  Labs: CBC    Component Value Date/Time   WBC 8.7 06/09/2022 0155   RBC 3.33 (L) 06/09/2022 0155   HGB 11.2 (L) 06/09/2022 0202   HGB 11.4 05/02/2022 1020   HCT 33.0 (L) 06/09/2022 0202   HCT 35.1 05/02/2022 1020   PLT 292 06/09/2022 0155   PLT 368  05/02/2022 1020   MCV 86.8 06/09/2022 0155   MCV 90 05/02/2022 1020   MCH 30.0 06/09/2022 0155   MCHC 34.6 06/09/2022 0155   RDW 17.6 (H) 06/09/2022 0155   RDW 16.5 (H) 05/02/2022 1020   LYMPHSABS 1.4 06/09/2022 0155   MONOABS 0.3 06/09/2022 0155   EOSABS 0.0 06/09/2022 0155   BASOSABS 0.1 06/09/2022 0155     CMP     Component Value Date/Time   NA 125 (L) 06/09/2022 0202   NA 143 05/02/2022 1020   K 4.5 06/09/2022 0202   CL 91 (L) 06/09/2022 0202   CO2 22 06/08/2022 2140   GLUCOSE 113 (H) 06/09/2022 0202   BUN <3 (L) 06/09/2022 0202   BUN 6 05/02/2022 1020   CREATININE 0.40 (L) 06/09/2022 0202   CREATININE 0.61 10/24/2015 0903   CALCIUM 9.1 06/08/2022 2140   PROT 7.7 06/08/2022 2140   PROT 7.7 05/02/2022 1020   ALBUMIN 3.7 06/08/2022  2140   ALBUMIN 4.4 05/02/2022 1020   AST 30 06/08/2022 2140   ALT 14 06/08/2022 2140   ALKPHOS 65 06/08/2022 2140   BILITOT 0.9 06/08/2022 2140   BILITOT <0.2 05/02/2022 1020   GFRNONAA >60 06/08/2022 2140   GFRAA 115 08/13/2019 0917    Imaging: CT Head/C-Spine: 1. No acute intracranial pathology. 2. No acute/traumatic cervical spine pathology.  EKG: personally reviewed my interpretation is sinus tachycardia with no significant changes compared to prior EKG, aside from faster rate.   ASSESSMENT & PLAN:   Assessment & Plan by Problem: Principal Problem:   Syncope, unspecified syncope type   Casey [name] is a 52 y.o. 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 Patient did not have any prodromal symptoms prior to this episode, and also was not noted  to have any incontinence/tongue bite or laceration after. No shaking noted during episode, as well, however, patient's daughter noted that she was frothing at the mouth and she thought that she may have had a seizure, although she does not have a history of seizures. Given the patient's history of ischemic cardiomyopathy and an LV thrombus, and her clinical history, my index of suspicion is high for a cardiac etiology of her syncope, but alcohol withdrawal seizure is also high on the differential. The patient was loaded with keppra in the ED by neurology and was also given 1 mg of IV ativan. ICD interrogated and showed NSVT possibly during the syncopal episode. Lactic acid elevated 7.9 and bicarb 17.5, although pH 7.404. Received 2x 500 ml LR bolus in ED. Bcx collected. Repeat improved to 2.2. Could be in setting of possible seizure activity.  - f/u EEG - f/u blood cultures  - Continue cardiac monitoring - Obtain echocardiogram  - Seizure precautions   Code stroke Acute onset dysarthria and mental status change Code stroke called as the patient acutely became dysarthric upon our evaluation and was now A&O to self only. CBG 139 during this episode. By the time neurology arrived at the bedside, the patient's speech had improved, however, with her history of an LV thrombus, she is certainly high risk for a stroke and is also high risk of having an intracranial bleed, given that she is on eliquis and hit her head after her syncopal episode earlier. CT head was negative for any intracranial abnormality and CT angio head/neck was negative for intracranial large vessel occlusion or significant stenosis. Okay to resume home Eliquis per neurology.  - appreciate neurology assistance and recommendations - f/u Echo - f/u EEG - UDS + UA pending - Obtain MRI brain, however, if unable to due to patient's pacemaker, plan to repeat CT head 12 hrs after initial episode (1300 on 3/9) - Frequent neuro checks   Alcohol  use disorder Patient's daughter notes that the patient drinks half to one bottle of vodka everyday and they are unsure when her last drink was. Ethanol level undetectable in the ED, thus, alcohol withdrawal is on the differential as the etiology of her seizure vs syncope. - CIWA monitoring without ativan  - thiamine and folic acid  - MVI   Hyponatremia Initial Na  130. Patient does not appear volume overloaded. Patient does have history of alcohol use but hyponatremia appears acute. No recent n/v/d.  - monitor BMP - check serum osmolality along with urine osm and Na  Ischemic cardiomyopathy s/p ICD CAD HLD Cath in 10/2015 showed 70% ramus, 50% followed by 55% LAD, and occluded RCA. Last EF 30-35% in 2020 with concentric LVH and impaired relaxation. She underwent ICD implantation in June 2020. The patient is on metoprolol XL 100 mg daily and Entresto 50 mg bid. Also on atorvastatin 80 mg daily.  - continue home atorvastatin - continue home metoprolol - hold Entresto for now since recent BP normotensive, restart if BP elevated  LV apical thrombus Cardiac MRI in July 2017 showed EF 35% and large apical thrombus and she has been on eliquis since then. Okay to resume home Eliquis per neurology.   - continue home Eliquis   Diet: Heart Healthy   VTE:  DOAC IVF: None,None Code: Full  Prior to Admission Living Arrangement: Home, living alone Anticipated Discharge Location:  Pending Barriers to Discharge: Medical work up and stability  Dispo: Admit patient to Observation with expected length of stay less than 2 midnights.  Signed: Angelique Blonder, DO Internal Medicine Resident PGY-1 06/09/2022, 3:54 AM   Please contact on call pager, weekdays after 5pm and weekends after 1pm, at [phone].

## 2022-06-10 DIAGNOSIS — E872 Acidosis, unspecified: Secondary | ICD-10-CM | POA: Diagnosis not present

## 2022-06-10 DIAGNOSIS — F1010 Alcohol abuse, uncomplicated: Secondary | ICD-10-CM | POA: Diagnosis not present

## 2022-06-10 DIAGNOSIS — R55 Syncope and collapse: Secondary | ICD-10-CM | POA: Diagnosis not present

## 2022-06-10 LAB — MAGNESIUM: Magnesium: 1.9 mg/dL (ref 1.7–2.4)

## 2022-06-10 LAB — BASIC METABOLIC PANEL
Anion gap: 13 (ref 5–15)
BUN: 7 mg/dL (ref 6–20)
CO2: 23 mmol/L (ref 22–32)
Calcium: 9.9 mg/dL (ref 8.9–10.3)
Chloride: 98 mmol/L (ref 98–111)
Creatinine, Ser: 0.74 mg/dL (ref 0.44–1.00)
GFR, Estimated: >60 mL/min (ref >60)
Glucose, Bld: 93 mg/dL (ref 70–99)
Potassium: 2.8 mmol/L — ABNORMAL LOW (ref 3.5–5.1)
Sodium: 134 mmol/L — ABNORMAL LOW (ref 135–145)

## 2022-06-10 LAB — PHOSPHORUS: Phosphorus: 3.3 mg/dL (ref 2.5–4.6)

## 2022-06-10 LAB — POTASSIUM: Potassium: 4 mmol/L (ref 3.5–5.1)

## 2022-06-10 MED ORDER — POTASSIUM CHLORIDE 10 MEQ/100ML IV SOLN
10.0000 meq | INTRAVENOUS | Status: AC
  Administered 2022-06-10 (×3): 10 meq via INTRAVENOUS
  Filled 2022-06-10 (×4): qty 100

## 2022-06-10 MED ORDER — NICOTINE 21 MG/24HR TD PT24: 21.0000 mg | MEDICATED_PATCH | Freq: Every day | TRANSDERMAL | Status: DC | PRN

## 2022-06-10 MED ORDER — POTASSIUM CHLORIDE 20 MEQ PO PACK
40.0000 meq | PACK | Freq: Once | ORAL | Status: AC
  Administered 2022-06-10: 40 meq via ORAL
  Filled 2022-06-10: qty 2

## 2022-06-10 NOTE — Evaluation (Signed)
Occupational Therapy Evaluation Patient Details Name: DAJANAY BIELEN MRN: [id number] DOB: Sep 25, 1970 Today's Date: 06/10/2022   History of Present Illness Pt is a 52 y.o. F who presents 06/08/2022 with potential alcohol withdrawal seizure vs syncope vs toci metabolic encephalopathy. EEG negative for seizures. CTH negative for acute abnormality. 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   Clinical Impression   Pt most likely close to baseline level of function. Anxious to go home to see her grandchildren. Recommend ETOH counseling. No further OT needs.       Recommendations for follow up therapy are one component of a multi-disciplinary discharge planning process, led by the attending physician.  Recommendations may be updated based on patient status, additional functional criteria and insurance authorization.   Follow Up Recommendations  No OT follow up     Assistance Recommended at Discharge Intermittent Supervision/Assistance  Patient can return home with the following Assist for transportation;Direct supervision/assist for medications management    Functional Status Assessment  Patient has not had a recent decline in their functional status  Equipment Recommendations  None recommended by OT    Recommendations for Other Services       Precautions / Restrictions Precautions Precautions: Fall Restrictions Weight Bearing Restrictions: No      Mobility Bed Mobility Overal bed mobility: Modified Independent                  Transfers Overall transfer level: Independent Equipment used: None                      Balance Overall balance assessment: Mild deficits observed, not formally tested                                         ADL either performed or assessed with clinical judgement   ADL Overall ADL's : At baseline                                             Vision  Baseline Vision/History: 0 No visual deficits       Perception     Praxis      Pertinent Vitals/Pain Pain Assessment Pain Assessment: No/denies pain     Hand Dominance Right   Extremity/Trunk Assessment Upper Extremity Assessment Upper Extremity Assessment: Overall WFL for tasks assessed   Lower Extremity Assessment Lower Extremity Assessment: Overall WFL for tasks assessed   Cervical / Trunk Assessment Cervical / Trunk Assessment: Normal   Communication Communication Communication: No difficulties   Cognition Arousal/Alertness: Awake/alert Behavior During Therapy: WFL for tasks assessed/performed Overall Cognitive Status: No family/caregiver present to determine baseline cognitive functioning                                 General Comments: Scored a 10 on the Short Blessed Test, indicating cognitive impairment; most likely baseline     General Comments  denies wanting education regaridng ETOH use    Exercises     Shoulder Instructions      Home Living Family/patient expects to be discharged to:: Private residence Living Arrangements: Alone Available Help at Discharge: Family Type of Home: Apartment Home Access: Level entry  Home Layout: One level     Bathroom Shower/Tub: Teacher, early years/pre: Standard     Home Equipment: None          Prior Functioning/Environment Prior Level of Function : Independent/Modified Independent             Mobility Comments: Family provides transportation. Pt denies recent falls other than one prior to admission. Reports when she drinks she "sits down." Overall describes lifestyle as a limited community ambulator. Typical day she will watch TV and go to mailbox.Family takes her to the store adn helps with errands; doesnot drive          OT Problem List:        OT Treatment/Interventions:      OT Goals(Current goals can be found in the care plan section) Acute Rehab OT  Goals Patient Stated Goal: to go home OT Goal Formulation: All assessment and education complete, DC therapy  OT Frequency:      Co-evaluation              AM-PAC OT "6 Clicks" Daily Activity     Outcome Measure Help from another person eating meals?: None Help from another person taking care of personal grooming?: None Help from another person toileting, which includes using toliet, bedpan, or urinal?: None Help from another person bathing (including washing, rinsing, drying)?: None Help from another person to put on and taking off regular upper body clothing?: None Help from another person to put on and taking off regular lower body clothing?: None 6 Click Score: 24   End of Session Nurse Communication: Mobility status  Activity Tolerance: Patient tolerated treatment well Patient left: in bed;with call bell/phone within reach;with bed alarm set                   Time: 1325-1340 OT Time Calculation (min): 15 min Charges:  OT General Charges $OT Visit: 1 Visit OT Evaluation $OT Eval Low Complexity: [place], OT/L   Acute OT Clinical Specialist Acute Rehabilitation Services Pager [phone] Office [phone]   [facility] 06/10/2022, 1:53 PM

## 2022-06-10 NOTE — Plan of Care (Addendum)
EEG performed yesterday was normal.   Repeat CT head performed yesterday showed no acute abnormality.   A/R:  52 year old female who initially presented to the ER for syncope vs EtOH withdrawal seizure after being found down on the floor at home by family with froth coming out of her mouth. She was being admitted to the internal medicine teaching service when the residents examining the patient noticed a sudden onset of confusion and slurred speech during that encounter. The daughter reported that the patient drinks half to 1 bottle of vodka every day and she was not sure if she had had anything to drink the day PTA or not.  - Has pacemaker. MRI brain has been ordered. Most likely will need to be performed on Monday due to the pacemaker.  - Has been continued on Eliquis per Dr. Johny Chess recommendations as no large infarct seen on repeat CT.  - Has not been continued on Keppra as EEG was normal.  - Ammonia came back elevated at 38 - Given the results of the above tests, the most likely components of the DDx for the patient's presentation are metabolic encephalopathy versus lacunar infarction too small to be detected on CT versus EtOH withdrawal seizure with postictal confusion.   - Neurohospitalist service will follow PRN. Please call if there is a small acute stroke or other significant abnormality on MRI.   Electronically signed: Dr. Kerney Elbe

## 2022-06-10 NOTE — Care Management Obs Status (Signed)
[place] NOTIFICATION   Patient Details  Name: Casey [name] MRN: [id number] Date of Birth: 08-02-1970   Medicare Observation Status Notification Given:  Yes    Carles Collet, RN 06/10/2022, 11:36 AM

## 2022-06-10 NOTE — Progress Notes (Signed)
Patient stated she was leaving AMA. Fully dressed and called daughter to pick up. MD [facility] paged and reported bedside. Pt said she will stay for MRI tomorrow as long as bed alarm is off and she can move freely about in the room. Doctors agreed to allow her to be independent in room. This RN educated the patient on the purpose of the bed alarm and the safety risks associated with declining. Pt stated understanding and still refused the bed alarm.

## 2022-06-10 NOTE — Evaluation (Signed)
Physical Therapy Evaluation and Discharge Patient Details Name: Casey [name] MRN: [id number] DOB: September 27, 1970 Today's Date: 06/10/2022  History of Present Illness  Pt is a 52 y.o. F who presents 06/08/2022 with potential alcohol withdrawal seizure vs syncope vs toci metabolic encephalopathy. EEG negative for seizures. CTH negative for acute abnormality. 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  Clinical Impression  Patient evaluated by Physical Therapy with no further acute PT needs identified. PTA, pt lives alone and is independent. Reports she is a limited community ambulator and has family assist with transportation. Pt is likely fairly close to her functional baseline. Denies recent falls other than fall prior to admission. Pt states, "I had a few drinks and went to sit on the bed and missed." Pt reports she recalls the entire incident. Pt ambulating 250 ft with no assistive device at a supervision level. Demonstrates mild dynamic balance deficits and questionable cognitive impairment (unsure of baseline). All education has been completed and the patient has no further questions. No follow-up Physical Therapy or equipment needs. PT is signing off. Thank you for this referral.      Recommendations for follow up therapy are one component of a multi-disciplinary discharge planning process, led by the attending physician.  Recommendations may be updated based on patient status, additional functional criteria and insurance authorization.  Follow Up Recommendations No PT follow up      Assistance Recommended at Discharge Intermittent Supervision/Assistance  Patient can return home with the following  Direct supervision/assist for medications management;Direct supervision/assist for financial management;Assist for transportation    Equipment Recommendations None recommended by PT  Recommendations for Other Services       Functional Status  Assessment Patient has had a recent decline in their functional status and demonstrates the ability to make significant improvements in function in a reasonable and predictable amount of time.     Precautions / Restrictions Precautions Precautions: Fall Restrictions Weight Bearing Restrictions: No      Mobility  Bed Mobility Overal bed mobility: Modified Independent                  Transfers Overall transfer level: Independent Equipment used: None                    Ambulation/Gait Ambulation/Gait assistance: Supervision Gait Distance (Feet): 250 Feet Assistive device: None Gait Pattern/deviations: Step-through pattern, Staggering left, Staggering right Gait velocity: decreased     General Gait Details: Pt with 2 episodes of mild staggering L vs R with turning, but able to regain balance without additional physical assist from PT  Stairs            Wheelchair Mobility    Modified Rankin (Stroke Patients Only)       Balance Overall balance assessment: Mild deficits observed, not formally tested                                           Pertinent Vitals/Pain Pain Assessment Pain Assessment: No/denies pain    Home Living Family/patient expects to be discharged to:: Private residence Living Arrangements: Alone Available Help at Discharge: Family Type of Home: Apartment Home Access: Level entry       Home Layout: One level Home Equipment: None      Prior Function Prior Level of Function : Independent/Modified Independent  Mobility Comments: Family provides transportation. Pt denies recent falls other than one prior to admission. Reports when she drinks she "sits down." Overall describes lifestyle as a limited community ambulator. Typical day she will watch TV and go to mailbox.       Hand Dominance        Extremity/Trunk Assessment   Upper Extremity Assessment Upper Extremity Assessment: Defer  to OT evaluation    Lower Extremity Assessment Lower Extremity Assessment: Overall WFL for tasks assessed    Cervical / Trunk Assessment Cervical / Trunk Assessment: Normal  Communication   Communication: No difficulties  Cognition Arousal/Alertness: Awake/alert Behavior During Therapy: WFL for tasks assessed/performed Overall Cognitive Status: No family/caregiver present to determine baseline cognitive functioning                                 General Comments: Pt with STM deficits, 1/3 recall, 2/3 recall with cues. Unsure of baseline.        General Comments      Exercises     Assessment/Plan    PT Assessment Patient does not need any further PT services  PT Problem List         PT Treatment Interventions      PT Goals (Current goals can be found in the Care Plan section)  Acute Rehab PT Goals Patient Stated Goal: go home PT Goal Formulation: All assessment and education complete, DC therapy    Frequency       Co-evaluation               AM-PAC PT "6 Clicks" Mobility  Outcome Measure Help needed turning from your back to your side while in a flat bed without using bedrails?: None Help needed moving from lying on your back to sitting on the side of a flat bed without using bedrails?: None Help needed moving to and from a bed to a chair (including a wheelchair)?: None Help needed standing up from a chair using your arms (e.g., wheelchair or bedside chair)?: None Help needed to walk in hospital room?: A Little Help needed climbing 3-5 steps with a railing? : A Little 6 Click Score: 22    End of Session   Activity Tolerance: Patient tolerated treatment well Patient left: in bed;with call bell/phone within reach;with bed alarm set   PT Visit Diagnosis: Unsteadiness on feet (R26.81)    Time: [id number] PT Time Calculation (min) (ACUTE ONLY): 13 min   Charges:   PT Evaluation $PT Eval Low Complexity: 1 Low          Wyona Almas, PT, DPT Acute Rehabilitation Services Office [phone]   Deno Etienne 06/10/2022, 11:41 AM

## 2022-06-10 NOTE — Progress Notes (Signed)
    HD#0 SUBJECTIVE:  Patient Summary:  52 year old female with past medical history of ischemic cardiomyopathy with previous history of LV thrombus on chronic anticoagulant therapy with Eliquis, history of multivessel CAD and alcohol abuse who presented for evaluation of a syncopal episode   Overnight Events: naeo    Interm History: Feels well.  No complaints.    OBJECTIVE:  Vital Signs: Vitals:   06/09/22 2006 06/10/22 0324 06/10/22 0739 06/10/22 1116  BP: 103/71 (!) 108/91 113/78 122/77  Pulse: (!) 108 99 98 93  Resp: 17 15 16 16   Temp: 98.9 F (37.2 C) 98.2 F (36.8 C) 98.7 F (37.1 C) 98.2 F (36.8 C)  TempSrc: Oral Oral Oral Oral  SpO2: 100% 100% 100% 98%  Weight:      Height:      General: Pleasant, no acute distress Cardiac: Regular rate and rhythm Lungs: Clear to auscultation bilaterally Neuro: Alert and oriented, peach fluid, interactive, no nystagmus.  Finger-to-nose testing no deficits, heel-to-shin intact, follows commands with improvement compared to yesterday.   ASSESSMENT/PLAN:  Assessment: Principal Problem:   Syncope, unspecified syncope type Active Problems:   Hyponatremia  Syncope: Stroke versus seizure. Favor alcohol withdrawal seizure given elevated lactic acid and improvement today compared to prior.  Repeat head CT unrevealing.  Echocardiogram with bubble study inconclusive but did not appear changed compared to prior either.  She will undergo MRI tomorrow.  If this is negative can likely be discharged home. - eliquis  EtOH use disorder. Hyponatremia - resolved Hypokalemia - resolved Patient was likely postictal from seizure yesterday.  Her mentation is much improved. She is being monitored with CIWA scores.  Has not required Ativan yet. Patient's hyponatremia appears improved today.  Potassium notably low which was repleted.  PT initially consulted due to gait instability but this has since resolved and no further needs identified. - CIWA  monitoring without ativan  - Thiamine and folic acid  - MVI   Heart failure with reduced ejection fraction - euvolemic Echocardiogram showing reduced ejection fraction 30 to 35% and regional wall motion abnormalities but this is unchanged compared to prior.  She is already taking Entresto, metoprolol, and Eliquis as an outpatient.  Entresto being held in the setting of low normal blood pressures. - continue statin, metop  Best Practice: Diet: Regular diet VTE: Eliquis Code: Full AB: None Therapy Recs: None, DME: none  Signature: Delene Ruffini, MD

## 2022-06-11 ENCOUNTER — Other Ambulatory Visit (HOSPITAL_COMMUNITY): Payer: Self-pay

## 2022-06-11 ENCOUNTER — Other Ambulatory Visit: Payer: Self-pay | Admitting: Cardiology

## 2022-06-11 ENCOUNTER — Observation Stay (HOSPITAL_COMMUNITY): Payer: Medicare HMO

## 2022-06-11 ENCOUNTER — Other Ambulatory Visit (HOSPITAL_BASED_OUTPATIENT_CLINIC_OR_DEPARTMENT_OTHER): Payer: Self-pay

## 2022-06-11 ENCOUNTER — Encounter: Payer: Self-pay | Admitting: Internal Medicine

## 2022-06-11 MED ORDER — NICOTINE 21 MG/24HR TD PT24
21.0000 mg | MEDICATED_PATCH | Freq: Every day | TRANSDERMAL | 0 refills | Status: DC | PRN
  Filled 2022-06-11: qty 28, 28d supply, fill #0

## 2022-06-11 NOTE — Progress Notes (Signed)
    HD#2 SUBJECTIVE:  Patient Summary:  52 year old female with past medical history of ischemic cardiomyopathy with previous history of LV thrombus on chronic anticoagulant therapy with Eliquis, history of multivessel CAD and alcohol abuse who presented for evaluation of a syncopal episode   Overnight Events: NAEON    Interm History: Patient denies any issues this morning. She reports sleeping and eating well.  OBJECTIVE:  Vital Signs: Vitals:   06/10/22 2332 06/11/22 0345 06/11/22 0755 06/11/22 1120  BP: (!) 117/93 127/76 112/68 138/84  Pulse: 89 87 83 88  Resp: (!) 22 14    Temp: 98.9 F (37.2 C) 98.3 F (36.8 C) 98.3 F (36.8 C) 98 F (36.7 C)  TempSrc: Oral Oral Oral   SpO2: 100% 99% 100% 100%  Weight:      Height:       General: Pleasant, well-appearing  in bed. No acute distress. Urinated in bed. CV: RRR. No murmurs, rubs, or gallops. No LE edema Pulmonary: Lungs CTAB. Normal effort. No wheezing or rales. Abdominal: Soft, nontender, nondistended.  Skin: Warm and dry. No obvious rash or lesions.   Neuro: Mental Status -  A&O x 3. Language including expression, naming and repetition was assessed and found intact. Improving insight on what brought her to the hospital.   Cranial Nerves II - XII - II - Visual field intact OU. III, IV, VI - Extraocular movements intact. No nystagmus noted  V - Facial sensation intact bilaterally. VII - Facial movement intact bilaterally. VIII - Hearing & vestibular intact bilaterally. X - Palate elevates symmetrically. XI - Chin turning & shoulder shrug intact bilaterally. XII - Tongue protrusion intact.   Cerebellar - Finger to nose exam intact Heal to shin exam intact   Motor Strength - The patient's strength was 5/5 in all extremities. Bulk was normal and fasciculations were absent.   Motor Tone - Muscle tone was assessed at the neck and appendages and was normal.   Sensory - Light touch assessed and symmetrical.    ASSESSMENT/PLAN:  Assessment: Principal Problem:   Syncope, unspecified syncope type Active Problems:   Hyponatremia  Syncope: Stroke versus seizure. Favor alcohol withdrawal seizure given elevated lactic acid and improvement today compared to prior.  Repeat head CT unrevealing.  Echocardiogram with bubble study inconclusive but did not appear changed compared to prior either.  She will undergo MRI tomorrow.  If this is negative can likely be discharged home. - continue eliquis  EtOH use disorder Patient in pre-contemplating stage of decreasing/stopping alcohol intake. She is agreeable to discussing medication and resources in her following PCP appointment. - continue folic acid and thiamine  Heart failure with reduced ejection fraction - euvolemic Echocardiogram showing reduced ejection fraction 30 to 35% and regional wall motion abnormalities but this is unchanged compared to prior.  She is already taking Entresto, metoprolol, and Eliquis as an outpatient.  Entresto being held in the setting of low normal blood pressures. - continue statin, metop - holding entresto, f/u with PCP regarding restarting medication  Best Practice: Diet: Regular diet VTE: Eliquis Code: Full AB: None Therapy Recs: None, DME: none  Signature: Coralyn Pear, MD PGY-1 Psychiatry

## 2022-06-11 NOTE — TOC Transition Note (Signed)
Transition of Care [facility]) - CM/SW Discharge Note   Patient Details  Name: Casey [name] MRN: [id number] Date of Birth: 09-Mar-1971  Transition of Care [facility]) CM/SW Contact:  Pollie Friar, RN Phone Number: 06/11/2022, 10:58 AM   Clinical Narrative:    Pt is from home alone. Her son and daughter can check in on her.  No DME at home.  Pt uses [employer] for needed transportation. PCP: none--per daughter the [facility] Internal med will pick up in clinic. Pt denies issues with home medications. Family will transport home when medically ready.   Final next level of care: Home/Self Care Barriers to Discharge: No Barriers Identified   Patient Goals and CMS Choice      Discharge Placement                         Discharge Plan and Services Additional resources added to the After Visit Summary for                                       Social Determinants of Health (SDOH) Interventions SDOH Screenings   Depression (PHQ2-9): Low Risk  (07/27/2021)  Tobacco Use: High Risk (06/08/2022)     Readmission Risk Interventions     No data to display

## 2022-06-11 NOTE — Progress Notes (Signed)
Pt back from MRI. Pt refusing to put telemetry back on. She is wanting to be discharged as soon as possible. Internal Medicine Resident Coralyn Pear made aware.

## 2022-06-11 NOTE — TOC CAGE-AID Note (Signed)
Transition of Care [facility]) - CAGE-AID Screening   Patient Details  Name: Casey [name] MRN: [id number] Date of Birth: Dec 02, 1970  Transition of Care [facility]) CM/SW Contact:    Pollie Friar, RN Phone Number: 06/11/2022, 10:56 AM   Clinical Narrative: Pt refused inpatient/ outpatient alcohol counseling resources from CM. Will add to AVS.   CAGE-AID Screening:    Have You Ever Felt You Ought to Cut Down on Your Drinking or Drug Use?: Yes Have People Annoyed You By Critizing Your Drinking Or Drug Use?: No Have You Felt Bad Or Guilty About Your Drinking Or Drug Use?: No Have You Ever Had a Drink or Used Drugs First Thing In The Morning to Steady Your Nerves or to Get Rid of a Hangover?: No CAGE-AID Score: 1  Substance Abuse Education Offered: Yes (pt refused)

## 2022-06-11 NOTE — Progress Notes (Addendum)
Informed of MRI for today.   Device system confirmed to be MRI conditional, with implant date > 6 weeks ago, and no evidence of abandoned or epicardial leads in review of most recent CXR Interrogation and programming from today performed by industry rep and reviewed over phone.   Tachy-therapies to off if applicable.  Program device back to pre-MRI settings after completion of exam.  Annamaria Helling  06/11/2022 1:04 PM

## 2022-06-11 NOTE — Progress Notes (Signed)
Erroneous encounter

## 2022-06-11 NOTE — TOC Benefit Eligibility Note (Signed)
Patient Advocate Encounter  Insurance verification completed.    The patient is currently admitted and upon discharge could be taking Farxiga 10 mg.  The current 30 day co-pay is $0.00.   The patient is currently admitted and upon discharge could be taking Jardiance 10 mg.  The current 30 day co-pay is $0.00.   The patient is insured through [employer] Medicare Part D   Daxten Kovalenko, CPHT Pharmacy Patient Advocate Specialist [facility] Patient Advocate Team Direct Number: [phone]  Fax: [phone]       

## 2022-06-14 LAB — CULTURE, BLOOD (ROUTINE X 2)
Culture: NO GROWTH
Culture: NO GROWTH
Special Requests: ADEQUATE

## 2022-06-18 ENCOUNTER — Other Ambulatory Visit (HOSPITAL_BASED_OUTPATIENT_CLINIC_OR_DEPARTMENT_OTHER): Payer: Self-pay

## 2022-06-19 ENCOUNTER — Encounter: Payer: Medicare HMO | Admitting: Student

## 2022-06-19 ENCOUNTER — Ambulatory Visit: Payer: Medicare HMO

## 2022-06-22 ENCOUNTER — Other Ambulatory Visit: Payer: Self-pay | Admitting: Student

## 2022-06-22 DIAGNOSIS — Z1231 Encounter for screening mammogram for malignant neoplasm of breast: Secondary | ICD-10-CM

## 2022-06-22 NOTE — Progress Notes (Signed)
Placed order for screening mammogram.  I will follow staff to call patient to schedule appointment.

## 2022-06-29 ENCOUNTER — Inpatient Hospital Stay: Admission: RE | Admit: 2022-06-29 | Payer: Medicare HMO | Source: Ambulatory Visit

## 2022-07-17 ENCOUNTER — Other Ambulatory Visit: Payer: Self-pay | Admitting: Cardiology

## 2022-07-17 DIAGNOSIS — I513 Intracardiac thrombosis, not elsewhere classified: Secondary | ICD-10-CM

## 2022-07-17 NOTE — Telephone Encounter (Signed)
Pt last saw Bernadene Person, NP on 04/16/22, last labs 06/10/22 Creat 0.74, age 52, weight 71.2kg, based on specified criteria pt is on appropriate dosage of Eliquis 5mg  BID for mural thrombus of cardiac apex.  Will refill rx.

## 2022-07-25 ENCOUNTER — Ambulatory Visit: Payer: Medicare HMO | Admitting: Pharmacist Clinician (PhC)/ Clinical Pharmacy Specialist

## 2022-07-25 LAB — COLOGUARD: COLOGUARD: NEGATIVE

## 2022-08-23 ENCOUNTER — Inpatient Hospital Stay: Admission: RE | Admit: 2022-08-23 | Payer: Medicare HMO | Source: Ambulatory Visit

## 2022-08-30 ENCOUNTER — Other Ambulatory Visit: Payer: Self-pay | Admitting: Family Medicine

## 2022-08-30 ENCOUNTER — Ambulatory Visit
Admission: RE | Admit: 2022-08-30 | Discharge: 2022-08-30 | Disposition: A | Discharge status: Home / Self Care | Payer: Medicare HMO | Source: Ambulatory Visit | Attending: Student in an Organized Health Care Education/Training Program | Admitting: Student in an Organized Health Care Education/Training Program

## 2022-08-30 DIAGNOSIS — Z1231 Encounter for screening mammogram for malignant neoplasm of breast: Secondary | ICD-10-CM

## 2022-09-24 ENCOUNTER — Ambulatory Visit (INDEPENDENT_AMBULATORY_CARE_PROVIDER_SITE_OTHER): Payer: Medicare HMO

## 2022-09-24 DIAGNOSIS — I255 Ischemic cardiomyopathy: Secondary | ICD-10-CM | POA: Diagnosis not present

## 2022-09-25 LAB — CUP PACEART REMOTE DEVICE CHECK
Battery Remaining Longevity: 144 mo
Battery Remaining Percentage: 100 %
Brady Statistic RV Percent Paced: 0 %
Date Time Interrogation Session: 20240624072700
HighPow Impedance: 72 Ohm
Implantable Lead Connection Status: 753985
Implantable Lead Implant Date: 20200629
Implantable Lead Location: 753860
Implantable Lead Model: 292
Implantable Lead Serial Number: 448526
Implantable Pulse Generator Implant Date: 20200629
Lead Channel Impedance Value: 496 Ohm
Lead Channel Pacing Threshold Amplitude: 0.9 V
Lead Channel Pacing Threshold Pulse Width: 0.4 ms
Lead Channel Setting Pacing Amplitude: 2 V
Lead Channel Setting Pacing Pulse Width: 0.4 ms
Lead Channel Setting Sensing Sensitivity: 0.6 mV
Pulse Gen Serial Number: 262129
Zone Setting Status: 755011

## 2022-10-11 NOTE — Progress Notes (Signed)
Remote ICD transmission.   

## 2022-10-21 ENCOUNTER — Other Ambulatory Visit: Payer: Self-pay | Admitting: Cardiology

## 2022-10-21 DIAGNOSIS — I739 Peripheral vascular disease, unspecified: Secondary | ICD-10-CM

## 2022-10-21 DIAGNOSIS — I255 Ischemic cardiomyopathy: Secondary | ICD-10-CM

## 2022-10-31 ENCOUNTER — Other Ambulatory Visit: Payer: Self-pay | Admitting: Nurse Practitioner

## 2022-12-14 ENCOUNTER — Other Ambulatory Visit: Payer: Self-pay | Admitting: *Deleted

## 2022-12-14 DIAGNOSIS — I739 Peripheral vascular disease, unspecified: Secondary | ICD-10-CM

## 2022-12-24 ENCOUNTER — Ambulatory Visit (INDEPENDENT_AMBULATORY_CARE_PROVIDER_SITE_OTHER): Payer: Medicare HMO

## 2022-12-24 ENCOUNTER — Encounter: Payer: Medicare HMO | Admitting: Surgery

## 2022-12-24 ENCOUNTER — Ambulatory Visit (HOSPITAL_COMMUNITY): Payer: Medicare HMO | Attending: Surgery

## 2022-12-24 DIAGNOSIS — I255 Ischemic cardiomyopathy: Secondary | ICD-10-CM | POA: Diagnosis not present

## 2022-12-24 LAB — CUP PACEART REMOTE DEVICE CHECK
Battery Remaining Longevity: 138 mo
Battery Remaining Percentage: 97 %
Brady Statistic RV Percent Paced: 0 %
Date Time Interrogation Session: 20240923002100
HighPow Impedance: 68 Ohm
Implantable Lead Connection Status: 753985
Implantable Lead Implant Date: 20200629
Implantable Lead Location: 753860
Implantable Lead Model: 292
Implantable Lead Serial Number: 448526
Implantable Pulse Generator Implant Date: 20200629
Lead Channel Impedance Value: 486 Ohm
Lead Channel Pacing Threshold Amplitude: 0.9 V
Lead Channel Pacing Threshold Pulse Width: 0.4 ms
Lead Channel Setting Pacing Amplitude: 2 V
Lead Channel Setting Pacing Pulse Width: 0.4 ms
Lead Channel Setting Sensing Sensitivity: 0.6 mV
Pulse Gen Serial Number: 262129
Zone Setting Status: 755011

## 2023-01-04 ENCOUNTER — Other Ambulatory Visit: Payer: Self-pay | Admitting: Cardiology

## 2023-01-04 NOTE — Progress Notes (Signed)
Remote ICD transmission.   

## 2023-01-08 ENCOUNTER — Other Ambulatory Visit: Payer: Self-pay | Admitting: *Deleted

## 2023-01-08 DIAGNOSIS — I739 Peripheral vascular disease, unspecified: Secondary | ICD-10-CM

## 2023-01-16 ENCOUNTER — Ambulatory Visit (HOSPITAL_COMMUNITY): Payer: Medicare HMO

## 2023-01-17 ENCOUNTER — Ambulatory Visit (HOSPITAL_COMMUNITY)
Admission: RE | Admit: 2023-01-17 | Discharge: 2023-01-17 | Disposition: A | Discharge status: Home / Self Care | Payer: Medicare HMO | Source: Ambulatory Visit | Attending: Vascular Surgery | Admitting: Vascular Surgery

## 2023-01-17 DIAGNOSIS — I739 Peripheral vascular disease, unspecified: Secondary | ICD-10-CM | POA: Insufficient documentation

## 2023-01-17 LAB — VAS US ABI WITH/WO TBI
Left ABI: 0.25
Right ABI: 0.27

## 2023-01-24 ENCOUNTER — Encounter: Payer: Self-pay | Admitting: Vascular Surgery

## 2023-01-24 ENCOUNTER — Ambulatory Visit (INDEPENDENT_AMBULATORY_CARE_PROVIDER_SITE_OTHER): Payer: Medicare HMO | Admitting: Vascular Surgery

## 2023-01-24 VITALS — BP 157/89 | HR 81 | Temp 98.0°F | Ht 65.0 in | Wt 170.4 lb

## 2023-01-24 DIAGNOSIS — I70213 Atherosclerosis of native arteries of extremities with intermittent claudication, bilateral legs: Secondary | ICD-10-CM

## 2023-01-24 NOTE — Progress Notes (Signed)
Patient ID: Casey [name], female   DOB: June 24, 1970, 52 y.o.   MRN: [id number]  Reason for Consult: New Patient (Initial Visit)   Referred by Sharmon Revere, MD  Subjective:     HPI:  Casey [name] is a 52 y.o. female history of coronary artery disease as well as ischemic cardiomyopathy currently maintained on Eliquis.  She does have bilateral lower extremity claudication equal on both sides with discoloration of the toes on both feet.  She states that she can probably walk from this office to the parking lot prior to cramping and symptoms do resolve when she stops.  She does not have tissue loss or ulceration.  She has never had any peripheral arterial intervention.  Past Medical History:  Diagnosis Date   Anticoagulation adequate 10/14/2015   CAD (coronary artery disease)    Coronary atherosclerosis of native coronary artery, multivessel with plans for CABG to be followed   08/04/2015   Ischemic cardiomyopathy    Peripheral arterial disease (HCC)    UTI (urinary tract infection) 10/14/2015   Family History  Problem Relation Age of Onset   Diabetes Mellitus II Neg Hx    CAD Neg Hx    Breast cancer Neg Hx    Past Surgical History:  Procedure Laterality Date   CARDIAC CATHETERIZATION N/A 08/04/2015   Procedure: Coronary/Graft Angiography;  Surgeon: Peter M Swaziland, MD;  Location: [facility] INVASIVE CV LAB;  Service: Cardiovascular;  Laterality: N/A;   CARDIAC CATHETERIZATION N/A 10/10/2015   Procedure: Left Heart Cath and Coronary Angiography;  Surgeon: Lennette Bihari, MD;  Location: [facility] INVASIVE CV LAB;  Service: Cardiovascular;  Laterality: N/A;   CESAREAN SECTION     ICD IMPLANT N/A 09/29/2018   Procedure: ICD IMPLANT;  Surgeon: Marinus Maw, MD;  Location: [facility] INVASIVE CV LAB;  Service: Cardiovascular;  Laterality: N/A;    Short Social History:  Social History   Tobacco Use   Smoking status: Some Days    Current packs/day: 0.50    Average packs/day: 0.5 packs/day  for 25.0 years (12.5 ttl pk-yrs)    Types: Cigarettes   Smokeless tobacco: Never  Substance Use Topics   Alcohol use: Yes    Alcohol/week: 0.0 standard drinks of alcohol    Comment: Two drinks a night.    Allergies  Allergen Reactions   Bempedoic Acid Other (See Comments)    hallucinations    Current Outpatient Medications  Medication Sig Dispense Refill   apixaban (ELIQUIS) 5 MG TABS tablet TAKE 1 TABLET TWICE DAILY 180 tablet 2   atorvastatin (LIPITOR) 80 MG tablet TAKE 1 TABLET EVERY DAY 90 tablet 1   ENTRESTO 24-26 MG TAKE 1 TABLET TWICE DAILY 180 tablet 3   ezetimibe (ZETIA) 10 MG tablet TAKE 1 TABLET (10 MG TOTAL) BY MOUTH DAILY. 90 tablet 3   metoprolol succinate (TOPROL-XL) 100 MG 24 hr tablet TAKE 1 TABLET EVERY DAY 90 tablet 3   pantoprazole (PROTONIX) 40 MG tablet TAKE 1 TABLET (40 MG TOTAL) BY MOUTH DAILY. 90 tablet 10   [name] current facility-administered medications for this visit.    Review of Systems  Constitutional:  Constitutional negative. HENT: HENT negative.  Eyes: Eyes negative.  Respiratory: Respiratory negative.  Cardiovascular: Positive for claudication.  GI: Gastrointestinal negative.  Musculoskeletal: Positive for leg pain.  Neurological: Neurological negative. Hematologic: Hematologic/lymphatic negative.  Psychiatric: Psychiatric negative.        Objective:  Objective   Vitals:   01/24/23 0944  BP: Marland Kitchen)  157/89  Pulse: 81  Temp: 98 F (36.7 C)  TempSrc: Temporal  SpO2: 98%  Weight: 170 lb 6.4 oz (77.3 kg)  Height: 5\' 5"  (1.651 m)   Body mass index is 28.36 kg/m.  Physical Exam  Data: ABI Findings:  +---------+------------------+-----+----------+--------+  Right   Rt Pressure (mmHg)IndexWaveform  Comment   +---------+------------------+-----+----------+--------+  Brachial 129                                        +---------+------------------+-----+----------+--------+  PTA                            absent               +---------+------------------+-----+----------+--------+  DP      36                0.27 monophasic          +---------+------------------+-----+----------+--------+  Great Toe                       Absent              +---------+------------------+-----+----------+--------+   +---------+------------------+-----+----------+-------+  Left    Lt Pressure (mmHg)IndexWaveform  Comment  +---------+------------------+-----+----------+-------+  Brachial 133                                       +---------+------------------+-----+----------+-------+  PTA     21                0.16 monophasic         +---------+------------------+-----+----------+-------+  DP      33                0.25 monophasic         +---------+------------------+-----+----------+-------+  Great Toe                       Absent             +---------+------------------+-----+----------+-------+   +-------+-----------+-----------+------------+------------+  ABI/TBIToday's ABIToday's TBIPrevious ABIPrevious TBI  +-------+-----------+-----------+------------+------------+  Right 0.27       0                                    +-------+-----------+-----------+------------+------------+  Left  0.25       0                                    +-------+-----------+-----------+------------+------------+           Summary:  Right: Resting right ankle-brachial index indicates critical limb  ischemia. The right toe-brachial index is abnormal.   Left: Resting left ankle-brachial index indicates critical left limb  ischemia. The left toe-brachial index is abnormal.      Assessment/Plan:    52 year old female with severely depressed ABIs bilaterally consistent with critical limb ischemia although her symptoms are minimal and currently she only claudicates although it is a short distance.  She does not have palpable common femoral pulses bilaterally to  suggest a more central process and her previous CT of her abdomen and pelvis did  demonstrate nearly occluded right common iliac artery and this was several years ago.  I discussed the need for urgent smoking cessation and she demonstrates good understanding.  Will obtain CT angio of the abdomen pelvis with bilateral runoff.  Although patient is young she does have significant cardiac history and the intervention would need to be aggressive endovascular approach.  We discussed the need to keep walking and protect her feet and again for urgent smoking cessation we will get her scheduled for CT as above in the next 3 to 4 weeks with follow-up visit in office to discuss result.     Maeola Harman MD [facility]

## 2023-02-08 ENCOUNTER — Other Ambulatory Visit: Payer: Self-pay

## 2023-02-08 DIAGNOSIS — I70213 Atherosclerosis of native arteries of extremities with intermittent claudication, bilateral legs: Secondary | ICD-10-CM

## 2023-02-11 ENCOUNTER — Encounter: Payer: Self-pay | Admitting: Vascular Surgery

## 2023-02-21 ENCOUNTER — Other Ambulatory Visit: Payer: Self-pay | Admitting: Cardiology

## 2023-02-21 DIAGNOSIS — I513 Intracardiac thrombosis, not elsewhere classified: Secondary | ICD-10-CM

## 2023-02-21 NOTE — Telephone Encounter (Signed)
Prescription refill request for Eliquis received. Indication: mural thrombus Last office visit: [place], 04/16/2022 Scr: 0.74, 06/10/2022 Age: 52 yo  Weight:  77.3 kg   Refill sent.

## 2023-02-25 ENCOUNTER — Other Ambulatory Visit: Payer: Medicare HMO

## 2023-03-06 ENCOUNTER — Ambulatory Visit: Payer: Medicare HMO | Admitting: Vascular Surgery

## 2023-03-12 ENCOUNTER — Other Ambulatory Visit: Payer: Medicare HMO

## 2023-03-25 ENCOUNTER — Ambulatory Visit (INDEPENDENT_AMBULATORY_CARE_PROVIDER_SITE_OTHER): Payer: Medicare HMO

## 2023-03-25 DIAGNOSIS — I255 Ischemic cardiomyopathy: Secondary | ICD-10-CM

## 2023-03-25 LAB — CUP PACEART REMOTE DEVICE CHECK
Battery Remaining Longevity: 144 mo
Battery Remaining Percentage: 100 %
Brady Statistic RV Percent Paced: 0 %
Date Time Interrogation Session: 20241223085100
HighPow Impedance: 66 Ohm
Implantable Lead Connection Status: 753985
Implantable Lead Implant Date: 20200629
Implantable Lead Location: 753860
Implantable Lead Model: 292
Implantable Lead Serial Number: 448526
Implantable Pulse Generator Implant Date: 20200629
Lead Channel Impedance Value: 483 Ohm
Lead Channel Pacing Threshold Amplitude: 0.8 V
Lead Channel Pacing Threshold Pulse Width: 0.4 ms
Lead Channel Setting Pacing Amplitude: 2 V
Lead Channel Setting Pacing Pulse Width: 0.4 ms
Lead Channel Setting Sensing Sensitivity: 0.6 mV
Pulse Gen Serial Number: 262129
Zone Setting Status: 755011

## 2023-03-27 ENCOUNTER — Other Ambulatory Visit: Payer: Self-pay | Admitting: Cardiology

## 2023-04-10 ENCOUNTER — Ambulatory Visit: Payer: Medicare HMO | Admitting: Vascular Surgery

## 2023-04-30 NOTE — Progress Notes (Signed)
Remote ICD transmission.

## 2023-05-05 ENCOUNTER — Other Ambulatory Visit: Payer: Self-pay | Admitting: Cardiology

## 2023-06-09 ENCOUNTER — Other Ambulatory Visit: Payer: Self-pay | Admitting: Cardiology

## 2023-06-24 ENCOUNTER — Ambulatory Visit: Payer: Medicare HMO

## 2023-06-24 DIAGNOSIS — I255 Ischemic cardiomyopathy: Secondary | ICD-10-CM

## 2023-06-25 ENCOUNTER — Encounter: Payer: Self-pay | Admitting: Internal Medicine

## 2023-06-25 LAB — CUP PACEART REMOTE DEVICE CHECK
Battery Remaining Longevity: 138 mo
Battery Remaining Percentage: 95 %
Brady Statistic RV Percent Paced: 0 %
Date Time Interrogation Session: 20250324071500
HighPow Impedance: 77 Ohm
Implantable Lead Connection Status: 753985
Implantable Lead Implant Date: 20200629
Implantable Lead Location: 753860
Implantable Lead Model: 292
Implantable Lead Serial Number: 448526
Implantable Pulse Generator Implant Date: 20200629
Lead Channel Impedance Value: 496 Ohm
Lead Channel Pacing Threshold Amplitude: 0.8 V
Lead Channel Pacing Threshold Pulse Width: 0.4 ms
Lead Channel Setting Pacing Amplitude: 2 V
Lead Channel Setting Pacing Pulse Width: 0.4 ms
Lead Channel Setting Sensing Sensitivity: 0.6 mV
Pulse Gen Serial Number: 262129
Zone Setting Status: 755011

## 2023-07-01 ENCOUNTER — Ambulatory Visit: Payer: Medicare HMO | Admitting: Internal Medicine

## 2023-07-20 ENCOUNTER — Other Ambulatory Visit: Payer: Self-pay | Admitting: Cardiology

## 2023-07-20 DIAGNOSIS — I513 Intracardiac thrombosis, not elsewhere classified: Secondary | ICD-10-CM

## 2023-07-22 NOTE — Telephone Encounter (Signed)
 Prescription refill request for Eliquis  received. Indication:thrombus Last office visit:upcoming WUJ:WJXBJ labs Age: 53 Weight:77.3  kg  Prescription refilled

## 2023-07-23 ENCOUNTER — Ambulatory Visit: Attending: Student | Admitting: Student

## 2023-07-24 ENCOUNTER — Encounter: Payer: Self-pay | Admitting: Student

## 2023-08-08 NOTE — Progress Notes (Signed)
 Remote ICD transmission.

## 2023-08-08 NOTE — Addendum Note (Signed)
 Addended by: Edra Govern D on: 08/08/2023 01:46 PM   Modules accepted: Orders

## 2023-08-11 ENCOUNTER — Other Ambulatory Visit: Payer: Self-pay | Admitting: Cardiology

## 2023-08-11 DIAGNOSIS — I739 Peripheral vascular disease, unspecified: Secondary | ICD-10-CM

## 2023-08-11 DIAGNOSIS — I255 Ischemic cardiomyopathy: Secondary | ICD-10-CM

## 2023-09-11 NOTE — Progress Notes (Signed)
  Electrophysiology Office Note:   Date:  09/12/2023  ID:  Casey [name], DOB 1970/05/29, MRN [id number]  Primary Cardiologist: Alexandria Angel, MD Primary Heart Failure: None Electrophysiologist: Manya Sells, MD       History of Present Illness:   Casey [name] is a 53 y.o. female with h/o ICM, HFrEF, NSVT, HTN, HLD, LV apical thrombus, tobacco abuse, GERD seen today for routine electrophysiology followup.   Since last being seen in our clinic the patient reports doing well overall. She is no longer working / disabled.  She has no device related concerns. Has never been shocked.   She denies chest pain, palpitations, dyspnea, PND, orthopnea, nausea, vomiting, dizziness, syncope, edema, weight gain, or early satiety.   Review of systems complete and found to be negative unless listed in HPI.   EP Information / Studies Reviewed:    EKG is ordered today. Personal review as below.  EKG Interpretation Date/Time:  Thursday September 12 2023 14:34:07 EDT Ventricular Rate:  89 PR Interval:  156 QRS Duration:  96 QT Interval:  378 QTC Calculation: 459 R Axis:   -37  Text Interpretation: Normal sinus rhythm Left axis deviation Confirmed by Creighton Doffing ([id number]) on 09/12/2023 2:42:11 PM   ICD Interrogation-  reviewed in detail today,  See PACEART report.  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 for NICM / HFrEF History of appropriate therapy: No History of AAD therapy: No   Studies:  LHC 10/2015 > moderately severe LV dysfunction, EF 30-35%, multi-vessel disease ECHO w/Bubble 06/2022 > poor images, LVEF 30-35%, GIDD, inconclusive bubble study due to poor windows          Physical Exam:   VS:  BP 114/80 (BP Location: Right Arm, Patient Position: Sitting, Cuff Size: Large)   Pulse 89   Ht 5' 5 (1.651 m)   Wt 162 lb 3.2 oz (73.6 kg)   LMP 09/29/2018 (Exact Date)   SpO2 99%   BMI 26.99 kg/m    Wt Readings from Last 3 Encounters:  09/12/23 162  lb 3.2 oz (73.6 kg)  01/24/23 170 lb 6.4 oz (77.3 kg)  06/08/22 156 lb 15.5 oz (71.2 kg)     GEN: Well nourished, well developed in no acute distress NECK: No JVD; No carotid bruits CARDIAC: Regular rate and rhythm, no murmurs, rubs, gallops, no tethering  RESPIRATORY:  Clear to auscultation without rales, wheezing or rhonchi  ABDOMEN: Soft, non-tender, non-distended EXTREMITIES:  No edema; No deformity   ASSESSMENT AND PLAN:    Chronic Systolic Dysfunction / ICM s/p [employer] single chamber ICD  LVEF 30-35% 07/2022 -euvolemic on by exam  -Stable on an appropriate medical regimen -Normal ICD function -See Pace Art report -No changes today  LV Apical Thrombus  -eliquis  5mg  BID, appropriate dosing by age / wt   Hypertension  -well controlled on current regimen    CAD  HLD -per Cardiology   Disposition:   Follow up with Dr. Carolynne Citron in 12 months   Signed, Creighton Doffing, NP-C, AGACNP-BC [facility] - Electrophysiology  09/12/2023, 5:55 PM

## 2023-09-12 ENCOUNTER — Encounter: Payer: Self-pay | Admitting: Pulmonary Disease

## 2023-09-12 ENCOUNTER — Ambulatory Visit: Attending: Pulmonary Disease | Admitting: Pulmonary Disease

## 2023-09-12 VITALS — BP 114/80 | HR 89 | Ht 65.0 in | Wt 162.2 lb

## 2023-09-12 DIAGNOSIS — I5022 Chronic systolic (congestive) heart failure: Secondary | ICD-10-CM | POA: Diagnosis not present

## 2023-09-12 DIAGNOSIS — I10 Essential (primary) hypertension: Secondary | ICD-10-CM

## 2023-09-12 DIAGNOSIS — I255 Ischemic cardiomyopathy: Secondary | ICD-10-CM

## 2023-09-12 DIAGNOSIS — I2510 Atherosclerotic heart disease of native coronary artery without angina pectoris: Secondary | ICD-10-CM

## 2023-09-12 LAB — CUP PACEART INCLINIC DEVICE CHECK
Date Time Interrogation Session: 20250612174947
Implantable Lead Connection Status: 753985
Implantable Lead Implant Date: 20200629
Implantable Lead Location: 753860
Implantable Lead Model: 292
Implantable Lead Serial Number: 448526
Implantable Pulse Generator Implant Date: 20200629
Pulse Gen Serial Number: 262129

## 2023-09-12 NOTE — Patient Instructions (Addendum)
 Medication Instructions:  No medications changes today   *If you need a refill on your cardiac medications before your next appointment, please call your pharmacy*  Your device is working as it should.  We will see you back in one year. Continue to take your medications as prescribed.    Consider stopping smoking.    Lab Work: No lab work today If you have labs (blood work) drawn today and your tests are completely normal, you will receive your results only by: [employer] (if you have MyChart) OR A paper copy in the mail If you have any lab test that is abnormal or we need to change your treatment, we will call you to review the results.  Testing/Procedures: No testing/procedures were scheduled today  Follow-Up: At [facility], you and your health needs are our priority.  As part of our continuing mission to provide you with exceptional heart care, our providers are all part of one team.  This team includes your primary Cardiologist (physician) and Advanced Practice Providers or APPs (Physician Assistants and Nurse Practitioners) who all work together to provide you with the care you need, when you need it.  Your next appointment:   12 month(s)  Provider:   You may see Manya Sells, MD or one of the following Advanced Practice Providers on your designated Care Team:    Creighton Doffing, NP    We recommend signing up for the patient portal called MyChart.  Sign up information is provided on this After Visit Summary.  MyChart is used to connect with patients for Virtual Visits (Telemedicine).  Patients are able to view lab/test results, encounter notes, upcoming appointments, etc.  Non-urgent messages can be sent to your provider as well.   To learn more about what you can do with MyChart, go to [email or web address].

## 2023-09-23 ENCOUNTER — Ambulatory Visit: Payer: Medicare HMO

## 2023-09-23 DIAGNOSIS — I5022 Chronic systolic (congestive) heart failure: Secondary | ICD-10-CM

## 2023-09-23 DIAGNOSIS — I255 Ischemic cardiomyopathy: Secondary | ICD-10-CM

## 2023-09-24 LAB — CUP PACEART REMOTE DEVICE CHECK
Battery Remaining Longevity: 126 mo
Battery Remaining Percentage: 87 %
Brady Statistic RV Percent Paced: 0 %
Date Time Interrogation Session: 20250623002100
HighPow Impedance: 71 Ohm
Implantable Lead Connection Status: 753985
Implantable Lead Implant Date: 20200629
Implantable Lead Location: 753860
Implantable Lead Model: 292
Implantable Lead Serial Number: 448526
Implantable Pulse Generator Implant Date: 20200629
Lead Channel Impedance Value: 481 Ohm
Lead Channel Pacing Threshold Amplitude: 1 V
Lead Channel Pacing Threshold Pulse Width: 0.4 ms
Lead Channel Setting Pacing Amplitude: 2.2 V
Lead Channel Setting Pacing Pulse Width: 0.4 ms
Lead Channel Setting Sensing Sensitivity: 0.6 mV
Pulse Gen Serial Number: 262129
Zone Setting Status: 755011

## 2023-09-29 ENCOUNTER — Ambulatory Visit: Payer: Self-pay | Admitting: Internal Medicine

## 2023-10-11 ENCOUNTER — Other Ambulatory Visit: Payer: Self-pay | Admitting: Family Medicine

## 2023-10-11 DIAGNOSIS — Z1231 Encounter for screening mammogram for malignant neoplasm of breast: Secondary | ICD-10-CM

## 2023-10-24 ENCOUNTER — Other Ambulatory Visit: Payer: Self-pay | Admitting: Cardiology

## 2023-10-25 NOTE — Addendum Note (Signed)
 Addended by: TAWNI DRILLING D on: 10/25/2023 04:28 PM   Modules accepted: Orders

## 2023-10-25 NOTE — Progress Notes (Signed)
 Remote ICD transmission.

## 2023-11-10 ENCOUNTER — Other Ambulatory Visit: Payer: Self-pay

## 2023-11-10 ENCOUNTER — Emergency Department (HOSPITAL_COMMUNITY)

## 2023-11-10 ENCOUNTER — Encounter (HOSPITAL_COMMUNITY): Payer: Self-pay | Admitting: *Deleted

## 2023-11-10 ENCOUNTER — Inpatient Hospital Stay (HOSPITAL_COMMUNITY)
Admission: EM | Admit: 2023-11-10 | Discharge: 2023-11-13 | DRG: 897 | Disposition: A | Discharge status: Home / Self Care | Attending: Internal Medicine | Admitting: Internal Medicine

## 2023-11-10 DIAGNOSIS — R569 Unspecified convulsions: Secondary | ICD-10-CM

## 2023-11-10 DIAGNOSIS — I5020 Unspecified systolic (congestive) heart failure: Secondary | ICD-10-CM

## 2023-11-10 DIAGNOSIS — N179 Acute kidney failure, unspecified: Secondary | ICD-10-CM | POA: Diagnosis present

## 2023-11-10 DIAGNOSIS — I471 Supraventricular tachycardia, unspecified: Secondary | ICD-10-CM

## 2023-11-10 DIAGNOSIS — F17210 Nicotine dependence, cigarettes, uncomplicated: Secondary | ICD-10-CM | POA: Diagnosis present

## 2023-11-10 DIAGNOSIS — R779 Abnormality of plasma protein, unspecified: Secondary | ICD-10-CM | POA: Insufficient documentation

## 2023-11-10 DIAGNOSIS — E878 Other disorders of electrolyte and fluid balance, not elsewhere classified: Secondary | ICD-10-CM | POA: Diagnosis present

## 2023-11-10 DIAGNOSIS — G40909 Epilepsy, unspecified, not intractable, without status epilepticus: Secondary | ICD-10-CM | POA: Diagnosis present

## 2023-11-10 DIAGNOSIS — S0990XA Unspecified injury of head, initial encounter: Secondary | ICD-10-CM

## 2023-11-10 DIAGNOSIS — R55 Syncope and collapse: Secondary | ICD-10-CM | POA: Diagnosis present

## 2023-11-10 DIAGNOSIS — I4719 Other supraventricular tachycardia: Secondary | ICD-10-CM | POA: Diagnosis present

## 2023-11-10 DIAGNOSIS — D696 Thrombocytopenia, unspecified: Secondary | ICD-10-CM | POA: Diagnosis present

## 2023-11-10 DIAGNOSIS — I739 Peripheral vascular disease, unspecified: Secondary | ICD-10-CM | POA: Diagnosis present

## 2023-11-10 DIAGNOSIS — Y92039 Unspecified place in apartment as the place of occurrence of the external cause: Secondary | ICD-10-CM

## 2023-11-10 DIAGNOSIS — Z716 Tobacco abuse counseling: Secondary | ICD-10-CM

## 2023-11-10 DIAGNOSIS — Z7901 Long term (current) use of anticoagulants: Secondary | ICD-10-CM

## 2023-11-10 DIAGNOSIS — F10239 Alcohol dependence with withdrawal, unspecified: Principal | ICD-10-CM | POA: Diagnosis present

## 2023-11-10 DIAGNOSIS — Z79899 Other long term (current) drug therapy: Secondary | ICD-10-CM

## 2023-11-10 DIAGNOSIS — E872 Acidosis, unspecified: Secondary | ICD-10-CM | POA: Diagnosis present

## 2023-11-10 DIAGNOSIS — R4182 Altered mental status, unspecified: Secondary | ICD-10-CM | POA: Diagnosis not present

## 2023-11-10 DIAGNOSIS — I5022 Chronic systolic (congestive) heart failure: Secondary | ICD-10-CM | POA: Diagnosis present

## 2023-11-10 DIAGNOSIS — R000 Tachycardia, unspecified: Secondary | ICD-10-CM

## 2023-11-10 DIAGNOSIS — F17200 Nicotine dependence, unspecified, uncomplicated: Secondary | ICD-10-CM | POA: Diagnosis present

## 2023-11-10 DIAGNOSIS — Z951 Presence of aortocoronary bypass graft: Secondary | ICD-10-CM

## 2023-11-10 DIAGNOSIS — F1090 Alcohol use, unspecified, uncomplicated: Secondary | ICD-10-CM | POA: Insufficient documentation

## 2023-11-10 DIAGNOSIS — R32 Unspecified urinary incontinence: Secondary | ICD-10-CM | POA: Diagnosis present

## 2023-11-10 DIAGNOSIS — Y900 Blood alcohol level of less than 20 mg/100 ml: Secondary | ICD-10-CM | POA: Diagnosis present

## 2023-11-10 DIAGNOSIS — I951 Orthostatic hypotension: Secondary | ICD-10-CM | POA: Diagnosis present

## 2023-11-10 DIAGNOSIS — K219 Gastro-esophageal reflux disease without esophagitis: Secondary | ICD-10-CM | POA: Diagnosis present

## 2023-11-10 DIAGNOSIS — I513 Intracardiac thrombosis, not elsewhere classified: Secondary | ICD-10-CM | POA: Clinically undetermined

## 2023-11-10 DIAGNOSIS — E876 Hypokalemia: Secondary | ICD-10-CM | POA: Diagnosis present

## 2023-11-10 DIAGNOSIS — I2510 Atherosclerotic heart disease of native coronary artery without angina pectoris: Secondary | ICD-10-CM | POA: Diagnosis present

## 2023-11-10 DIAGNOSIS — E871 Hypo-osmolality and hyponatremia: Principal | ICD-10-CM | POA: Diagnosis present

## 2023-11-10 DIAGNOSIS — Z560 Unemployment, unspecified: Secondary | ICD-10-CM

## 2023-11-10 DIAGNOSIS — W1839XA Other fall on same level, initial encounter: Secondary | ICD-10-CM | POA: Diagnosis present

## 2023-11-10 DIAGNOSIS — R7989 Other specified abnormal findings of blood chemistry: Secondary | ICD-10-CM | POA: Insufficient documentation

## 2023-11-10 DIAGNOSIS — Z95810 Presence of automatic (implantable) cardiac defibrillator: Secondary | ICD-10-CM

## 2023-11-10 DIAGNOSIS — I252 Old myocardial infarction: Secondary | ICD-10-CM

## 2023-11-10 DIAGNOSIS — I110 Hypertensive heart disease with heart failure: Secondary | ICD-10-CM | POA: Diagnosis present

## 2023-11-10 DIAGNOSIS — R931 Abnormal findings on diagnostic imaging of heart and coronary circulation: Secondary | ICD-10-CM

## 2023-11-10 DIAGNOSIS — I255 Ischemic cardiomyopathy: Secondary | ICD-10-CM | POA: Diagnosis present

## 2023-11-10 DIAGNOSIS — F10939 Alcohol use, unspecified with withdrawal, unspecified: Secondary | ICD-10-CM | POA: Insufficient documentation

## 2023-11-10 DIAGNOSIS — D509 Iron deficiency anemia, unspecified: Secondary | ICD-10-CM | POA: Diagnosis present

## 2023-11-10 DIAGNOSIS — E785 Hyperlipidemia, unspecified: Secondary | ICD-10-CM | POA: Diagnosis present

## 2023-11-10 DIAGNOSIS — E222 Syndrome of inappropriate secretion of antidiuretic hormone: Secondary | ICD-10-CM | POA: Diagnosis present

## 2023-11-10 DIAGNOSIS — S01512A Laceration without foreign body of oral cavity, initial encounter: Secondary | ICD-10-CM | POA: Diagnosis present

## 2023-11-10 DIAGNOSIS — I2582 Chronic total occlusion of coronary artery: Secondary | ICD-10-CM | POA: Diagnosis present

## 2023-11-10 DIAGNOSIS — Z555 Less than a high school diploma: Secondary | ICD-10-CM

## 2023-11-10 DIAGNOSIS — Z86718 Personal history of other venous thrombosis and embolism: Secondary | ICD-10-CM

## 2023-11-10 DIAGNOSIS — R296 Repeated falls: Secondary | ICD-10-CM | POA: Diagnosis present

## 2023-11-10 DIAGNOSIS — I5A Non-ischemic myocardial injury (non-traumatic): Secondary | ICD-10-CM | POA: Diagnosis present

## 2023-11-10 DIAGNOSIS — Z888 Allergy status to other drugs, medicaments and biological substances status: Secondary | ICD-10-CM

## 2023-11-10 LAB — COMPREHENSIVE METABOLIC PANEL WITH GFR
ALT: 14 U/L (ref 0–44)
AST: 26 U/L (ref 15–41)
Albumin: 3.9 g/dL (ref 3.5–5.0)
Alkaline Phosphatase: 66 U/L (ref 38–126)
Anion gap: 19 — ABNORMAL HIGH (ref 5–15)
BUN: 13 mg/dL (ref 6–20)
CO2: 17 mmol/L — ABNORMAL LOW (ref 22–32)
Calcium: 10.1 mg/dL (ref 8.9–10.3)
Chloride: 90 mmol/L — ABNORMAL LOW (ref 98–111)
Creatinine, Ser: 1.25 mg/dL — ABNORMAL HIGH (ref 0.44–1.00)
GFR, Estimated: 52 mL/min — ABNORMAL LOW (ref >60)
Glucose, Bld: 154 mg/dL — ABNORMAL HIGH (ref 70–99)
Potassium: 3.9 mmol/L (ref 3.5–5.1)
Sodium: 126 mmol/L — ABNORMAL LOW (ref 135–145)
Total Bilirubin: 0.8 mg/dL (ref 0.0–1.2)
Total Protein: 8.6 g/dL — ABNORMAL HIGH (ref 6.5–8.1)

## 2023-11-10 LAB — BASIC METABOLIC PANEL WITH GFR
Anion gap: 14 (ref 5–15)
BUN: 12 mg/dL (ref 6–20)
CO2: 21 mmol/L — ABNORMAL LOW (ref 22–32)
Calcium: 9.3 mg/dL (ref 8.9–10.3)
Chloride: 91 mmol/L — ABNORMAL LOW (ref 98–111)
Creatinine, Ser: 1.03 mg/dL — ABNORMAL HIGH (ref 0.44–1.00)
GFR, Estimated: >60 mL/min (ref >60)
Glucose, Bld: 117 mg/dL — ABNORMAL HIGH (ref 70–99)
Potassium: 3.8 mmol/L (ref 3.5–5.1)
Sodium: 126 mmol/L — ABNORMAL LOW (ref 135–145)

## 2023-11-10 LAB — CBC WITH DIFFERENTIAL/PLATELET
Abs Immature Granulocytes: 0.03 K/uL (ref 0.00–0.07)
Basophils Absolute: 0 K/uL (ref 0.0–0.1)
Basophils Relative: 0 %
Eosinophils Absolute: 0 K/uL (ref 0.0–0.5)
Eosinophils Relative: 0 %
HCT: 33.9 % — ABNORMAL LOW (ref 36.0–46.0)
Hemoglobin: 11.8 g/dL — ABNORMAL LOW (ref 12.0–15.0)
Immature Granulocytes: 0 %
Lymphocytes Relative: 13 %
Lymphs Abs: 1 K/uL (ref 0.7–4.0)
MCH: 29.9 pg (ref 26.0–34.0)
MCHC: 34.8 g/dL (ref 30.0–36.0)
MCV: 85.8 fL (ref 80.0–100.0)
Monocytes Absolute: 0.3 K/uL (ref 0.1–1.0)
Monocytes Relative: 4 %
Neutro Abs: 6.5 K/uL (ref 1.7–7.7)
Neutrophils Relative %: 83 %
Platelets: 254 K/uL (ref 150–400)
RBC: 3.95 MIL/uL (ref 3.87–5.11)
RDW: 15.7 % — ABNORMAL HIGH (ref 11.5–15.5)
WBC: 7.9 K/uL (ref 4.0–10.5)
nRBC: 0 % (ref 0.0–0.2)

## 2023-11-10 LAB — TROPONIN I (HIGH SENSITIVITY)
Troponin I (High Sensitivity): 34 ng/L — ABNORMAL HIGH (ref <18)
Troponin I (High Sensitivity): 44 ng/L — ABNORMAL HIGH (ref <18)
Troponin I (High Sensitivity): 68 ng/L — ABNORMAL HIGH (ref <18)

## 2023-11-10 LAB — ETHANOL: Alcohol, Ethyl (B): <15 mg/dL (ref <15)

## 2023-11-10 LAB — PHOSPHORUS: Phosphorus: 2.8 mg/dL (ref 2.5–4.6)

## 2023-11-10 LAB — HIV ANTIBODY (ROUTINE TESTING W REFLEX): HIV Screen 4th Generation wRfx: NONREACTIVE

## 2023-11-10 LAB — CBG MONITORING, ED: Glucose-Capillary: 162 mg/dL — ABNORMAL HIGH (ref 70–99)

## 2023-11-10 LAB — MAGNESIUM: Magnesium: 1.3 mg/dL — ABNORMAL LOW (ref 1.7–2.4)

## 2023-11-10 LAB — I-STAT CG4 LACTIC ACID, ED: Lactic Acid, Venous: 0.8 mmol/L (ref 0.5–1.9)

## 2023-11-10 LAB — CK: Total CK: 79 U/L (ref 38–234)

## 2023-11-10 MED ORDER — PANTOPRAZOLE SODIUM 40 MG PO TBEC
40.0000 mg | DELAYED_RELEASE_TABLET | Freq: Every day | ORAL | Status: DC
  Administered 2023-11-11 – 2023-11-13 (×6): 40 mg via ORAL
  Filled 2023-11-10 (×3): qty 1

## 2023-11-10 MED ORDER — SODIUM CHLORIDE 0.9 % IV BOLUS
1000.0000 mL | Freq: Once | INTRAVENOUS | Status: AC
  Administered 2023-11-10: 1000 mL via INTRAVENOUS

## 2023-11-10 MED ORDER — NICOTINE 14 MG/24HR TD PT24
14.0000 mg | MEDICATED_PATCH | Freq: Every day | TRANSDERMAL | Status: DC
  Filled 2023-11-10 (×4): qty 1

## 2023-11-10 MED ORDER — THIAMINE HCL 100 MG/ML IJ SOLN: 100.0000 mg | Freq: Every day | INTRAMUSCULAR | Status: DC

## 2023-11-10 MED ORDER — FOLIC ACID 1 MG PO TABS
1.0000 mg | ORAL_TABLET | Freq: Every day | ORAL | Status: DC
  Administered 2023-11-10 – 2023-11-13 (×7): 1 mg via ORAL
  Filled 2023-11-10 (×4): qty 1

## 2023-11-10 MED ORDER — ACETAMINOPHEN 650 MG RE SUPP: 650.0000 mg | Freq: Four times a day (QID) | RECTAL | Status: DC | PRN

## 2023-11-10 MED ORDER — ACETAMINOPHEN 325 MG PO TABS: 650.0000 mg | ORAL_TABLET | Freq: Four times a day (QID) | ORAL | Status: DC | PRN

## 2023-11-10 MED ORDER — THIAMINE MONONITRATE 100 MG PO TABS: 100.0000 mg | ORAL_TABLET | Freq: Every day | ORAL | Status: DC

## 2023-11-10 MED ORDER — LORAZEPAM 1 MG PO TABS: 1.0000 mg | ORAL_TABLET | ORAL | Status: DC | PRN

## 2023-11-10 MED ORDER — MAGNESIUM SULFATE 2 GM/50ML IV SOLN
2.0000 g | Freq: Once | INTRAVENOUS | Status: AC
  Administered 2023-11-10: 2 g via INTRAVENOUS
  Filled 2023-11-10: qty 50

## 2023-11-10 MED ORDER — SACUBITRIL-VALSARTAN 24-26 MG PO TABS
1.0000 | ORAL_TABLET | Freq: Two times a day (BID) | ORAL | Status: DC
  Administered 2023-11-10: 1 via ORAL
  Filled 2023-11-10: qty 1

## 2023-11-10 MED ORDER — FOLIC ACID 1 MG PO TABS: 1.0000 mg | ORAL_TABLET | Freq: Every day | ORAL | Status: DC

## 2023-11-10 MED ORDER — SENNA 8.6 MG PO TABS
1.0000 | ORAL_TABLET | Freq: Two times a day (BID) | ORAL | Status: DC
  Administered 2023-11-10 – 2023-11-13 (×5): 8.6 mg via ORAL
  Filled 2023-11-10 (×5): qty 1

## 2023-11-10 MED ORDER — LORAZEPAM 2 MG/ML IJ SOLN: 1.0000 mg | INTRAMUSCULAR | Status: DC | PRN

## 2023-11-10 MED ORDER — ATORVASTATIN CALCIUM 80 MG PO TABS
80.0000 mg | ORAL_TABLET | Freq: Every day | ORAL | Status: DC
  Administered 2023-11-11 – 2023-11-13 (×6): 80 mg via ORAL
  Filled 2023-11-10 (×3): qty 1

## 2023-11-10 MED ORDER — MAGNESIUM SULFATE 4 GM/100ML IV SOLN
4.0000 g | Freq: Once | INTRAVENOUS | Status: AC
  Administered 2023-11-10: 4 g via INTRAVENOUS
  Filled 2023-11-10: qty 100

## 2023-11-10 MED ORDER — EZETIMIBE 10 MG PO TABS
10.0000 mg | ORAL_TABLET | Freq: Every day | ORAL | Status: DC
  Administered 2023-11-11 – 2023-11-13 (×6): 10 mg via ORAL
  Filled 2023-11-10 (×3): qty 1

## 2023-11-10 MED ORDER — ADULT MULTIVITAMIN W/MINERALS CH: 1.0000 | ORAL_TABLET | Freq: Every day | ORAL | Status: DC

## 2023-11-10 MED ORDER — LACTATED RINGERS IV BOLUS
1000.0000 mL | Freq: Once | INTRAVENOUS | Status: AC
  Administered 2023-11-10: 1000 mL via INTRAVENOUS

## 2023-11-10 MED ORDER — APIXABAN 5 MG PO TABS
5.0000 mg | ORAL_TABLET | Freq: Two times a day (BID) | ORAL | Status: DC
  Administered 2023-11-10: 5 mg via ORAL
  Filled 2023-11-10: qty 1

## 2023-11-10 MED ORDER — MAGNESIUM SULFATE 2 GM/50ML IV SOLN: 2.0000 g | Freq: Once | INTRAVENOUS | Status: DC

## 2023-11-10 MED ORDER — ADULT MULTIVITAMIN W/MINERALS CH
1.0000 | ORAL_TABLET | Freq: Every day | ORAL | Status: DC
  Administered 2023-11-10 – 2023-11-13 (×7): 1 via ORAL
  Filled 2023-11-10 (×4): qty 1

## 2023-11-10 MED ORDER — THIAMINE MONONITRATE 100 MG PO TABS
100.0000 mg | ORAL_TABLET | Freq: Every day | ORAL | Status: DC
  Administered 2023-11-10 – 2023-11-13 (×7): 100 mg via ORAL
  Filled 2023-11-10 (×4): qty 1

## 2023-11-10 MED ORDER — METOPROLOL SUCCINATE ER 100 MG PO TB24
100.0000 mg | ORAL_TABLET | Freq: Every day | ORAL | Status: DC
  Administered 2023-11-12 (×2): 100 mg via ORAL
  Filled 2023-11-10: qty 1

## 2023-11-10 NOTE — ED Notes (Signed)
 2nd Trop due @1903 

## 2023-11-10 NOTE — ED Triage Notes (Signed)
 BIB [facility] from home s/p unwitnessed sz-like activity with fall onto concrete. Initially unresponsive. Soaked of urine. No oral trauma. Unresponsive, followed by postictal on EMS arrival to scene. Gradually arousable to A&Ox4. Daughter at home and called EMS. Pt was taking the trash out at time of event. Unsure of hitting head. No wounds noted. Reportedly takes blood thinner and sz meds. Last sz distant per daughter. VSS. 152/60, 98% RA, HR 100, RR 20. No IV. Denies sx or complaints. C-collar present on arrival. CBG 172.

## 2023-11-10 NOTE — ED Notes (Signed)
 Patient transported to CT

## 2023-11-10 NOTE — ED Provider Notes (Signed)
 [place] EMERGENCY DEPARTMENT AT [facility] Provider Note   CSN: [id number] Arrival date & time: 11/10/23  1646     Patient presents with: Casey [name] is a 53 y.o. female history of ischemic cardiomyopathy, here presenting with altered mental status.  Patient's last normal was last night when she went to bed.  She states that she went and took the trash out.  Daughter just noticed that she was on the floor next to the trash can.  She could not tell me when she fell or how long she was on the floor for.  She had a possible history of alcohol withdrawal seizure.  Patient does drink alcoholic and did not tell me what was the last time she drank alcohol.  Patient is on Eliquis  for LV thrombus   The history is provided by the patient and a relative.       Prior to Admission medications   Medication Sig Start Date End Date Taking? Authorizing Provider  atorvastatin  (LIPITOR ) 80 MG tablet TAKE 1 TABLET EVERY DAY 06/10/23   Pietro Redell RAMAN, MD  ELIQUIS  5 MG TABS tablet TAKE 1 TABLET TWICE DAILY 07/22/23   Pietro Redell RAMAN, MD  ENTRESTO  24-26 MG TAKE 1 TABLET TWICE DAILY 01/04/23   Pietro Redell RAMAN, MD  ezetimibe  (ZETIA ) 10 MG tablet TAKE 1 TABLET EVERY DAY 08/12/23   Pietro Redell RAMAN, MD  metoprolol  succinate (TOPROL -XL) 100 MG 24 hr tablet TAKE 1 TABLET EVERY DAY 08/12/23   Pietro Redell RAMAN, MD  pantoprazole  (PROTONIX ) 40 MG tablet TAKE 1 TABLET EVERY DAY 05/08/23   Pietro Redell RAMAN, MD    Allergies: Bempedoic acid    Review of Systems  Neurological:  Positive for dizziness.  All other systems reviewed and are negative.   Updated Vital Signs BP (!) 151/96   Pulse (!) 109   Temp 98.1 F (36.7 C) (Oral)   Resp 18   Wt 73.5 kg   LMP 09/29/2018 (Exact Date)   SpO2 98%   BMI 26.96 kg/m   Physical Exam Vitals and nursing note reviewed.  Constitutional:      Appearance: Normal appearance.  HENT:     Head: Normocephalic.     Comments: Questionable  posterior scalp hematoma.  No obvious laceration    Nose: Nose normal.     Mouth/Throat:     Mouth: Mucous membranes are moist.  Eyes:     Extraocular Movements: Extraocular movements intact.     Pupils: Pupils are equal, round, and reactive to light.  Cardiovascular:     Rate and Rhythm: Normal rate and regular rhythm.     Pulses: Normal pulses.  Pulmonary:     Effort: Pulmonary effort is normal.     Breath sounds: Normal breath sounds.  Abdominal:     General: Abdomen is flat.     Palpations: Abdomen is soft.  Musculoskeletal:        General: Normal range of motion.     Cervical back: Normal range of motion and neck supple.  Skin:    General: Skin is warm.     Capillary Refill: Capillary refill takes less than 2 seconds.  Neurological:     General: No focal deficit present.     Mental Status: She is alert and oriented to person, place, and time.     Cranial Nerves: No cranial nerve deficit.     Motor: No weakness.  Psychiatric:  Mood and Affect: Mood normal.        Behavior: Behavior normal.     (all labs ordered are listed, but only abnormal results are displayed) Labs Reviewed  CBC WITH DIFFERENTIAL/PLATELET - Abnormal; Notable for the following components:      Result Value   Hemoglobin 11.8 (*)    HCT 33.9 (*)    RDW 15.7 (*)    All other components within normal limits  CBG MONITORING, ED - Abnormal; Notable for the following components:   Glucose-Capillary 162 (*)    All other components within normal limits  COMPREHENSIVE METABOLIC PANEL WITH GFR  CK  MAGNESIUM   ETHANOL  RAPID URINE DRUG SCREEN, HOSP PERFORMED  TROPONIN I (HIGH SENSITIVITY)    EKG: EKG Interpretation Date/Time:  Sunday November 10 2023 17:01:01 EDT Ventricular Rate:  113 PR Interval:  151 QRS Duration:  96 QT Interval:  344 QTC Calculation: 472 R Axis:   41  Text Interpretation: Sinus tachycardia Right atrial enlargement Left ventricular hypertrophy Borderline T  abnormalities, lateral leads No significant change since last tracing Confirmed by Patt Alm DEL [phone]) on 11/10/2023 5:05:22 PM  Radiology: CT Cervical Spine Wo Contrast Result Date: 11/10/2023 CLINICAL DATA:  Neck trauma, dangerous injury mechanism (Age 18-64y) EXAM: CT CERVICAL SPINE WITHOUT CONTRAST TECHNIQUE: Multidetector CT imaging of the cervical spine was performed without intravenous contrast. Multiplanar CT image reconstructions were also generated. RADIATION DOSE REDUCTION: This exam was performed according to the departmental dose-optimization program which includes automated exposure control, adjustment of the mA and/or kV according to patient size and/or use of iterative reconstruction technique. COMPARISON:  June 08, 2022 FINDINGS: Alignment: Normal alignment with appropriate cervical lordosis. No spondylolisthesis, uncovering of the facet joints, or significant widening of the spinous processes. No subluxation or abnormality identified at the craniovertebral junction. Skull base and vertebrae: Vertebral body heights are preserved. No acute fracture. No primary bone lesion or focal pathologic process.The lateral masses of C1 are well aligned with C2. The odontoid is intact. Soft tissues and spinal canal: No prevertebral edema or soft tissue thickening. No visible canal hematoma. Disc levels: Mild intervertebral disc height loss at C4-C5 and C5-C6 with osteophyte formation. Circumferential disc bulges at these levels cause mild spinal canal narrowing. Upper chest: No focal airspace consolidation or pleural effusion. Other: Calcified atherosclerosis in the right carotid artery. IMPRESSION: 1. No acute fracture or traumatic malalignment of the cervical spine. 2. Mild degenerative disc disease at C4-C5 and C5-C6. Electronically Signed   By: Rogelia Myers M.D.   On: 11/10/2023 17:57   CT Head Wo Contrast Result Date: 11/10/2023 CLINICAL DATA:  Head trauma, abnormal mental status (Age 56-64y) EXAM:  CT HEAD WITHOUT CONTRAST TECHNIQUE: Contiguous axial images were obtained from the base of the skull through the vertex without intravenous contrast. RADIATION DOSE REDUCTION: This exam was performed according to the departmental dose-optimization program which includes automated exposure control, adjustment of the mA and/or kV according to patient size and/or use of iterative reconstruction technique. COMPARISON:  June 09, 2022 FINDINGS: Brain: The ventricles appear age appropriate. No mass effect or midline shift. Gray-white differentiation is preserved without focal attenuation abnormality.No evidence of acute territorial infarction, extra-axial fluid collection, hemorrhage, or mass lesion. The basilar cisterns are patent without downward herniation. The cerebellar hemispheres and vermis are well formed without mass lesion or focal attenuation abnormality. Vascular: No hyperdense vessel. Skull: Normal. Negative for fracture or focal lesion. Sinuses/Orbits: The paranasal sinuses and mastoids are clear.The globes appear intact. No  retrobulbar hematoma. Other: None. IMPRESSION: No acute intracranial abnormality, specifically, no acute hemorrhage, territorial infarction, or intracranial mass. Electronically Signed   By: Rogelia Myers M.D.   On: 11/10/2023 17:48   DG Pelvis Portable Result Date: 11/10/2023 CLINICAL DATA:  Fall EXAM: PORTABLE PELVIS 1-2 VIEWS COMPARISON:  None Available. FINDINGS: SI joints are non widened. Pubic symphysis and rami appear intact. Limited assessment of right femoral neck due to superimposition of trochanter and positioning. No dislocation IMPRESSION: Limited assessment of the right femoral neck due to positioning and superimposition of trochanter. No definite acute osseous abnormality. Cross-sectional imaging follow-up if persistent concern for pelvic or hip fracture Electronically Signed   By: Luke Bun M.D.   On: 11/10/2023 17:25   DG Chest Port 1 View Result Date:  11/10/2023 EXAM: 1 VIEW XRAY OF THE CHEST 11/10/2023 05:17:00 PM COMPARISON: 09/29/2018 CLINICAL HISTORY: Fall. BIB [facility] from home s/p unwitnessed sz-like activity with fall onto concrete. Initially unresponsive. Soaked of urine. No oral trauma. Unresponsive, followed by postictal on EMS arrival to scene. Gradually arousable to A\T\Ox4. Daughter at home and called EMS. Pt was taking the trash out at time of event. Unsure of hitting head. No wounds noted. Reportedly takes blood thinner and sz meds. FINDINGS: LUNGS AND PLEURA: No focal pulmonary opacity. No pulmonary edema. No pleural effusion. No pneumothorax. HEART AND MEDIASTINUM: Moderate cardiomegaly. No congestive heart failure. ICD takes a circuitous route over the right atrium, terminating over the right ventricle. BONES AND SOFT TISSUES: No acute osseous abnormality. Multiple wires and leads project over the chest on the frontal radiograph. IMPRESSION: 1. No acute process. 2. Moderate cardiomegaly without congestive heart failure. Electronically signed by: Rockey Kilts MD 11/10/2023 05:25 PM EDT RP Workstation: [id number]     Procedures   CRITICAL CARE Performed by: Alm VEAR Cave   Total critical care time: 30 minutes  Critical care time was exclusive of separately billable procedures and treating other patients.  Critical care was necessary to treat or prevent imminent or life-threatening deterioration.  Critical care was time spent personally by me on the following activities: development of treatment plan with patient and/or surrogate as well as nursing, discussions with consultants, evaluation of patient's response to treatment, examination of patient, obtaining history from patient or surrogate, ordering and performing treatments and interventions, ordering and review of laboratory studies, ordering and review of radiographic studies, pulse oximetry and re-evaluation of patient's condition.   Medications Ordered in the ED  sodium  chloride 0.9 % bolus 1,000 mL (1,000 mLs Intravenous New Bag/Given 11/10/23 1707)                                    Medical Decision Making JAMEY DEMCHAK is a 53 y.o. female history of ischemic cardiomyopathy here presenting with seizure versus syncope.  Unclear how she fell.  She was admitted earlier this year for similar symptoms and was not started on any seizure meds.  At that time they were concerned for possible alcohol withdrawal seizure versus syncope.  Plan to get CBC and CMP and CK level and troponin.  Will get CT head to rule out head bleed.  7:48 PM CT head unremarkable.  Magnesium  level is slightly low and sodium is low and anion gap is 20.  I wonder if patient has alcohol withdrawal seizure versus dehydration.  Patient's alcohol level is negative.  At this point we will admit for observation.  No seizure activity in the ED.    Problems Addressed: Hypomagnesemia: acute illness or injury Hyponatremia: acute illness or injury Injury of head, initial encounter: acute illness or injury Seizure-like activity (HCC): acute illness or injury  Amount and/or Complexity of Data Reviewed Labs: ordered. Decision-making details documented in ED Course. Radiology: ordered and independent interpretation performed. Decision-making details documented in ED Course.  Risk Prescription drug management.     Final diagnoses:  None    ED Discharge Orders     None          Patt Alm Macho, MD 11/10/23 1949

## 2023-11-10 NOTE — ED Notes (Signed)
 EDP at [facility]

## 2023-11-10 NOTE — Hospital Course (Addendum)
 Denies palpitations, dizziness, lightheaded. Denies any further LOC. No new acute concerns at this time.    This is tough, good job dr marnie, seriously proud of you

## 2023-11-10 NOTE — H&P (Cosign Needed Addendum)
 Date: 11/10/2023               Patient Name:  Casey [name] MRN: [id number]  DOB: 1971-01-31 Age / Sex: 53 y.o., female   PCP: Haze Kingfisher, MD         Medical Service: Internal Medicine Teaching Service         Attending Physician: Dr. Mliss Foot      First Contact: Geryl Quintus Premo, DO    Second Contact: Dr. Libby Blanch, DO          Pager Information: First Contact Pager: [phone]   Second Contact Pager: [phone]   SUBJECTIVE   Chief Complaint: Fall at home  History of Present Illness: Casey [name] is a 53 y.o. female with PMH of Mural thrombus of cardiac apex, cardiomyopathy, GERD, hyperlipidemia, microcytic anemia, thrombocytopenia, and NSVT. Prior to transport to ED, she was found on the floor next to the trash can with evidence of incontinence and no recollection of the events. Upon interview, she reports that she was fussing at her grandchildren and taking out the trash and then woke up in the ambulance. She denies that this has ever happened before but her family and friends in the room dispute that and state that this happened in the past and she was hospitalized then as well. During our conversation, she was easily agitated and becoming frustrated at the number of questions and that she was unable to go home. When asked about substance use, she reports that she smokes 1/2 pack per day and drinks alcohol only twice per month. She denies CP, SOB, cough, N/V/D, leg swelling, and dysuria.  For further history, her daughter who currently lives with her was contacted. She reported that she found her mother down by the trash can outside; it is unknown how long she had been down. Her mother was arousable when found, but was very confused and unable to follow the conversation. She denies seeing any convulsions. It is unknown whether she hit her head or not. She also confirmed a history of alcohol use reporting that she drinks every day and will have multiple beers and at least  1/2 bottle of liquor. She frequently will only eat breakfast and then will drink the rest of the day. She did emphasize that this is not the first time the patient has had problems due to her alcohol consumption.    ED Course: Labs significant for Na 126, Cl 90, CO2 17, Cr 1.25, GFR 52, AG 19 Imaging CT head and neck without acute findings.  Received 2g Mg and 1L NS bolus Consulted IMTS  Meds: Lipitor  80 mg daily Entresto  24-26 mg BID Zetia  mg daily Toprol  100 mg daily Eliquis  5 mg daily  Current Meds  Medication Sig   atorvastatin  (LIPITOR ) 80 MG tablet TAKE 1 TABLET EVERY DAY (Patient taking differently: Take 80 mg by mouth 2 (two) times daily.)   ELIQUIS  5 MG TABS tablet TAKE 1 TABLET TWICE DAILY   ENTRESTO  24-26 MG TAKE 1 TABLET TWICE DAILY   ezetimibe  (ZETIA ) 10 MG tablet TAKE 1 TABLET EVERY DAY (Patient taking differently: Take 10 mg by mouth 2 (two) times daily.)   metoprolol  succinate (TOPROL -XL) 100 MG 24 hr tablet TAKE 1 TABLET EVERY DAY (Patient taking differently: Take 100 mg by mouth 2 (two) times daily.)    Past Medical History Syncope Mural thrombus of cardiac apex Ischemic cardiomyopathy GERD NSVT HTN Hypokalemia Hypomagnesemia  Past Surgical History Past Surgical History:  Procedure Laterality  Date   CARDIAC CATHETERIZATION N/A 08/04/2015   Procedure: Coronary/Graft Angiography;  Surgeon: Peter M Swaziland, MD;  Location: [facility] INVASIVE CV LAB;  Service: Cardiovascular;  Laterality: N/A;   CARDIAC CATHETERIZATION N/A 10/10/2015   Procedure: Left Heart Cath and Coronary Angiography;  Surgeon: Debby DELENA Sor, MD;  Location: [facility] INVASIVE CV LAB;  Service: Cardiovascular;  Laterality: N/A;   CESAREAN SECTION     ICD IMPLANT N/A 09/29/2018   Procedure: ICD IMPLANT;  Surgeon: Waddell Danelle ORN, MD;  Location: [facility] INVASIVE CV LAB;  Service: Cardiovascular;  Laterality: N/A;     Social:  Lives With: Daughter and grandchildren Occupation: Unemployed Support:  Daughters Level of Function: Independent, however does not drive PCP:  Paliwal, Himanshu, MD  Substances: -Tobacco: 15pack year history -Alcohol: Daily: multiple beers with 1/2 bottle of liquor per day -Recreational Drug: Denies  Family History:  Family History  Problem Relation Age of Onset   Diabetes Mellitus II Neg Hx    CAD Neg Hx    Breast cancer Neg Hx      Allergies: Allergies as of 11/10/2023 - Review Complete 11/10/2023  Allergen Reaction Noted   Bempedoic acid Other (See Comments) 07/14/2021    Review of Systems: A complete ROS was negative except as per HPI.   OBJECTIVE:   Physical Exam: Blood pressure (!) 151/96, pulse (!) 109, temperature 98.1 F (36.7 C), temperature source Oral, resp. rate 18, weight 73.5 kg, last menstrual period 09/29/2018, SpO2 98%.  Constitutional: well-appearing female in no acute distress HENT: normocephalic atraumatic, mucous membranes moist Eyes: conjunctiva non-erythematous Neck: supple Cardiovascular: Tachycardic, No murmurs, rubs, or gallops. No pitting edema bilaterally  Pulmonary/Chest: normal work of breathing on room air, lungs clear to auscultation bilaterally Abdominal: soft, non-tender, non-distended MSK: normal bulk and tone Neurological: alert & oriented x 3, PERRL Skin: warm and dry Psych: agitated on exam, argumentative with family at bedside.   Labs: CBC    Component Value Date/Time   WBC 7.9 11/10/2023 1703   RBC 3.95 11/10/2023 1703   HGB 11.8 (L) 11/10/2023 1703   HGB 11.4 05/02/2022 1020   HCT 33.9 (L) 11/10/2023 1703   HCT 35.1 05/02/2022 1020   PLT 254 11/10/2023 1703   PLT 368 05/02/2022 1020   MCV 85.8 11/10/2023 1703   MCV 90 05/02/2022 1020   MCH 29.9 11/10/2023 1703   MCHC 34.8 11/10/2023 1703   RDW 15.7 (H) 11/10/2023 1703   RDW 16.5 (H) 05/02/2022 1020   LYMPHSABS 1.0 11/10/2023 1703   MONOABS 0.3 11/10/2023 1703   EOSABS 0.0 11/10/2023 1703   BASOSABS 0.0 11/10/2023 1703     CMP      Component Value Date/Time   NA 126 (L) 11/10/2023 1703   NA 143 05/02/2022 1020   K 3.9 11/10/2023 1703   CL 90 (L) 11/10/2023 1703   CO2 17 (L) 11/10/2023 1703   GLUCOSE 154 (H) 11/10/2023 1703   BUN 13 11/10/2023 1703   BUN 6 05/02/2022 1020   CREATININE 1.25 (H) 11/10/2023 1703   CREATININE 0.61 10/24/2015 0903   CALCIUM  10.1 11/10/2023 1703   PROT 8.6 (H) 11/10/2023 1703   PROT 7.7 05/02/2022 1020   ALBUMIN 3.9 11/10/2023 1703   ALBUMIN 4.4 05/02/2022 1020   AST 26 11/10/2023 1703   ALT 14 11/10/2023 1703   ALKPHOS 66 11/10/2023 1703   BILITOT 0.8 11/10/2023 1703   BILITOT <0.2 05/02/2022 1020   GFRNONAA 52 (L) 11/10/2023 1703   GFRAA 115 08/13/2019  0917    Imaging: CT Cervical Spine Wo Contrast Result Date: 11/10/2023 CLINICAL DATA:  Neck trauma, dangerous injury mechanism (Age 32-64y) EXAM: CT CERVICAL SPINE WITHOUT CONTRAST TECHNIQUE: Multidetector CT imaging of the cervical spine was performed without intravenous contrast. Multiplanar CT image reconstructions were also generated. RADIATION DOSE REDUCTION: This exam was performed according to the departmental dose-optimization program which includes automated exposure control, adjustment of the mA and/or kV according to patient size and/or use of iterative reconstruction technique. COMPARISON:  June 08, 2022 FINDINGS: Alignment: Normal alignment with appropriate cervical lordosis. No spondylolisthesis, uncovering of the facet joints, or significant widening of the spinous processes. No subluxation or abnormality identified at the craniovertebral junction. Skull base and vertebrae: Vertebral body heights are preserved. No acute fracture. No primary bone lesion or focal pathologic process.The lateral masses of C1 are well aligned with C2. The odontoid is intact. Soft tissues and spinal canal: No prevertebral edema or soft tissue thickening. No visible canal hematoma. Disc levels: Mild intervertebral disc height loss at C4-C5 and  C5-C6 with osteophyte formation. Circumferential disc bulges at these levels cause mild spinal canal narrowing. Upper chest: No focal airspace consolidation or pleural effusion. Other: Calcified atherosclerosis in the right carotid artery. IMPRESSION: 1. No acute fracture or traumatic malalignment of the cervical spine. 2. Mild degenerative disc disease at C4-C5 and C5-C6. Electronically Signed   By: Rogelia Myers M.D.   On: 11/10/2023 17:57   CT Head Wo Contrast Result Date: 11/10/2023 CLINICAL DATA:  Head trauma, abnormal mental status (Age 67-64y) EXAM: CT HEAD WITHOUT CONTRAST TECHNIQUE: Contiguous axial images were obtained from the base of the skull through the vertex without intravenous contrast. RADIATION DOSE REDUCTION: This exam was performed according to the departmental dose-optimization program which includes automated exposure control, adjustment of the mA and/or kV according to patient size and/or use of iterative reconstruction technique. COMPARISON:  June 09, 2022 FINDINGS: Brain: The ventricles appear age appropriate. No mass effect or midline shift. Gray-white differentiation is preserved without focal attenuation abnormality.No evidence of acute territorial infarction, extra-axial fluid collection, hemorrhage, or mass lesion. The basilar cisterns are patent without downward herniation. The cerebellar hemispheres and vermis are well formed without mass lesion or focal attenuation abnormality. Vascular: No hyperdense vessel. Skull: Normal. Negative for fracture or focal lesion. Sinuses/Orbits: The paranasal sinuses and mastoids are clear.The globes appear intact. No retrobulbar hematoma. Other: None. IMPRESSION: No acute intracranial abnormality, specifically, no acute hemorrhage, territorial infarction, or intracranial mass. Electronically Signed   By: Rogelia Myers M.D.   On: 11/10/2023 17:48   DG Pelvis Portable Result Date: 11/10/2023 CLINICAL DATA:  Fall EXAM: PORTABLE PELVIS 1-2  VIEWS COMPARISON:  None Available. FINDINGS: SI joints are non widened. Pubic symphysis and rami appear intact. Limited assessment of right femoral neck due to superimposition of trochanter and positioning. No dislocation IMPRESSION: Limited assessment of the right femoral neck due to positioning and superimposition of trochanter. No definite acute osseous abnormality. Cross-sectional imaging follow-up if persistent concern for pelvic or hip fracture Electronically Signed   By: Luke Bun M.D.   On: 11/10/2023 17:25   DG Chest Port 1 View Result Date: 11/10/2023 EXAM: 1 VIEW XRAY OF THE CHEST 11/10/2023 05:17:00 PM COMPARISON: 09/29/2018 CLINICAL HISTORY: Fall. BIB [facility] from home s/p unwitnessed sz-like activity with fall onto concrete. Initially unresponsive. Soaked of urine. No oral trauma. Unresponsive, followed by postictal on EMS arrival to scene. Gradually arousable to A\T\Ox4. Daughter at home and called EMS. Pt was taking  the trash out at time of event. Unsure of hitting head. No wounds noted. Reportedly takes blood thinner and sz meds. FINDINGS: LUNGS AND PLEURA: No focal pulmonary opacity. No pulmonary edema. No pleural effusion. No pneumothorax. HEART AND MEDIASTINUM: Moderate cardiomegaly. No congestive heart failure. ICD takes a circuitous route over the right atrium, terminating over the right ventricle. BONES AND SOFT TISSUES: No acute osseous abnormality. Multiple wires and leads project over the chest on the frontal radiograph. IMPRESSION: 1. No acute process. 2. Moderate cardiomegaly without congestive heart failure. Electronically signed by: Rockey Kilts MD 11/10/2023 05:25 PM EDT RP Workstation: [id number]     EKG: personally reviewed my interpretation is Sinus tachycardia.   ASSESSMENT & PLAN:   Assessment & Plan by Problem: Principal Problem:   Syncope Active Problems:   Tobacco use disorder   Hx of apical thrombus in 2018   Cardiomyopathy, ischemic   Chronic  anticoagulation   Hypomagnesemia   Hyponatremia   Alcohol use disorder   AKI (acute kidney injury) (HCC)   Chronic heart failure with reduced ejection fraction (HFrEF, <= 40%) (HCC)   Elevated troponin   Elevated serum protein level   Casey [name] is a 53 y.o. person living with a history of alcohol use disorder, tobacco abuse, mural thrombus of cardiac apex, cardiomyopathy, GERD, microcytic anemia, and HTN who presented after an unwitnessed fall at home and admitted for a syncope work up on hospital day 0  #Syncope #Suspected Alcohol Withdrawal #? Withdrawal seizure - A 53yo female with pertinent history of alcohol use disorder was found down outside her home by her family. Unclear history, however possible post-ictal state with reported confusion and decreased arousability by family and EMS.  - Per family, she consumes alcohol daily with multiple beers and 1/2 bottle of liquor. She was previously hospitalized in 2024 for a similar episode.  - At this time, alcohol withdrawal seizure vs. cardiogenic syncope. Will interrogate ICD in the morning, as this cannot be done overnight per cardiology.  - Echo ordered and pending due to significant cardiac hx.  - Orthostatic vitals tonight and in the AM - Will initiate CIWA protocol with ativan  - Thiamine  and B12  - Seizure precautions - Spot EEG pending - Neuro checks q4  HFrEF Ischemic Cardiomyopathy Hx of NSVT - PT with longstanding hx of HFrEF. Last echo in 2024 with EF of 30-35%. - Currently without exacerbation and peripherally euvolemic - Continue entresto  and metoprolol  100mg  daily.  - Echo ordered and pending - ICD placed in 2020. Unable to interrogate at this time as this can only be done during the day. - Continuous telemetry - Daily weights - Strict I's/O's - Mg > 2 and K > 4  Hyponatremia Electrolyte Abnormalities - Pt with documented hx of hypokalemia, hypomagnesemia, and hyponatremia - Likely due poor nutrition  as pt's family reports that she frequently will only eat once per day and drinks significant amounts of alcohol - In the ED, Na 126 and Mg 1.3. NS bolus given in the ED, will repeat BMP to monitor for response. 2g Mg given. - Urine studies pending - Concern for potential refeeding syndrome, will monitor electrolytes closely   AGMA - CMP in the ED with Bicarb 17 and anion gap 19. At this time, concern for starvation/alcoholic ketosis, however UA pending.  - 1L NS bolus given in the ED, repeat BMP pending.  - Lactic Acid pending  Protein Gap - On CMP in the ED, total protein 8.6 with albumin  3.9. Protein gap elevated at 4.7.  - Possibly due to hemoconcentration. Will re-evaluate with AM CMP.  - If still elevated, would consider HCV and HIV testing. Could also consider evaluation for monoclonal gammopathies with serum electrophoresis.   AKI -Pt without known history of kidney disease. Scr elevated 1.25 on admission. Suspect pre-renal with pt being found down and history of poor oral intake. Will follow with repeat BMP tonight.  - If no improvement of Scr, could be intra-renal pathology due to elevated protein and possible monoclonal gammopathy process.  - UA pending.   Hx of apical thrombus -Echo in 2020 without evidence of thrombus. Last echo in 2024 again without evidence of thrombus. Last visualized in 2018 -Follows with cardiology, however thrombus not frequently commented on in notes.  -With multiple falls being reported and pt continuing to have risk factors for falls, consider contacting cardiology for guidance with anticoagulation.  -Echo pending this admission.    Best practice: Diet: Heart Healthy VTE: DOAC IVF: None,None Code: Full  Disposition planning: Prior to Admission Living Arrangement: Home, living with family  Anticipated Discharge Location: Home  Dispo: Admit patient to Observation with expected length of stay less than 2 midnights.  Signed: Myrna Bitters,  DO Internal Medicine Resident  11/10/2023, 10:22 PM  Please contact IM Residency On-Call Pager at: [phone] or [phone].

## 2023-11-10 NOTE — ED Notes (Signed)
 Patient bladder scanned. Patient states she does not have to urinate at this time.

## 2023-11-11 ENCOUNTER — Observation Stay (HOSPITAL_BASED_OUTPATIENT_CLINIC_OR_DEPARTMENT_OTHER)

## 2023-11-11 ENCOUNTER — Observation Stay (HOSPITAL_COMMUNITY)

## 2023-11-11 DIAGNOSIS — I5022 Chronic systolic (congestive) heart failure: Secondary | ICD-10-CM | POA: Diagnosis present

## 2023-11-11 DIAGNOSIS — R296 Repeated falls: Secondary | ICD-10-CM | POA: Diagnosis present

## 2023-11-11 DIAGNOSIS — E871 Hypo-osmolality and hyponatremia: Secondary | ICD-10-CM

## 2023-11-11 DIAGNOSIS — R55 Syncope and collapse: Secondary | ICD-10-CM

## 2023-11-11 DIAGNOSIS — E222 Syndrome of inappropriate secretion of antidiuretic hormone: Secondary | ICD-10-CM | POA: Diagnosis present

## 2023-11-11 DIAGNOSIS — I2582 Chronic total occlusion of coronary artery: Secondary | ICD-10-CM | POA: Diagnosis present

## 2023-11-11 DIAGNOSIS — Y900 Blood alcohol level of less than 20 mg/100 ml: Secondary | ICD-10-CM | POA: Diagnosis present

## 2023-11-11 DIAGNOSIS — R32 Unspecified urinary incontinence: Secondary | ICD-10-CM | POA: Diagnosis present

## 2023-11-11 DIAGNOSIS — F1010 Alcohol abuse, uncomplicated: Secondary | ICD-10-CM

## 2023-11-11 DIAGNOSIS — N179 Acute kidney failure, unspecified: Secondary | ICD-10-CM | POA: Diagnosis present

## 2023-11-11 DIAGNOSIS — I4719 Other supraventricular tachycardia: Secondary | ICD-10-CM | POA: Diagnosis present

## 2023-11-11 DIAGNOSIS — I739 Peripheral vascular disease, unspecified: Secondary | ICD-10-CM | POA: Diagnosis present

## 2023-11-11 DIAGNOSIS — I513 Intracardiac thrombosis, not elsewhere classified: Secondary | ICD-10-CM | POA: Diagnosis not present

## 2023-11-11 DIAGNOSIS — I2510 Atherosclerotic heart disease of native coronary artery without angina pectoris: Secondary | ICD-10-CM | POA: Diagnosis present

## 2023-11-11 DIAGNOSIS — I5020 Unspecified systolic (congestive) heart failure: Secondary | ICD-10-CM | POA: Diagnosis not present

## 2023-11-11 DIAGNOSIS — I110 Hypertensive heart disease with heart failure: Secondary | ICD-10-CM | POA: Diagnosis present

## 2023-11-11 DIAGNOSIS — Y92039 Unspecified place in apartment as the place of occurrence of the external cause: Secondary | ICD-10-CM | POA: Diagnosis not present

## 2023-11-11 DIAGNOSIS — R4182 Altered mental status, unspecified: Secondary | ICD-10-CM | POA: Diagnosis present

## 2023-11-11 DIAGNOSIS — D696 Thrombocytopenia, unspecified: Secondary | ICD-10-CM | POA: Diagnosis present

## 2023-11-11 DIAGNOSIS — I5030 Unspecified diastolic (congestive) heart failure: Secondary | ICD-10-CM | POA: Diagnosis not present

## 2023-11-11 DIAGNOSIS — I5A Non-ischemic myocardial injury (non-traumatic): Secondary | ICD-10-CM | POA: Diagnosis present

## 2023-11-11 DIAGNOSIS — Z79899 Other long term (current) drug therapy: Secondary | ICD-10-CM

## 2023-11-11 DIAGNOSIS — I255 Ischemic cardiomyopathy: Secondary | ICD-10-CM | POA: Diagnosis present

## 2023-11-11 DIAGNOSIS — E785 Hyperlipidemia, unspecified: Secondary | ICD-10-CM | POA: Diagnosis present

## 2023-11-11 DIAGNOSIS — K219 Gastro-esophageal reflux disease without esophagitis: Secondary | ICD-10-CM | POA: Diagnosis present

## 2023-11-11 DIAGNOSIS — F10129 Alcohol abuse with intoxication, unspecified: Secondary | ICD-10-CM | POA: Diagnosis not present

## 2023-11-11 DIAGNOSIS — S01512A Laceration without foreign body of oral cavity, initial encounter: Secondary | ICD-10-CM | POA: Diagnosis present

## 2023-11-11 DIAGNOSIS — I951 Orthostatic hypotension: Secondary | ICD-10-CM | POA: Diagnosis present

## 2023-11-11 DIAGNOSIS — E872 Acidosis, unspecified: Secondary | ICD-10-CM | POA: Diagnosis present

## 2023-11-11 DIAGNOSIS — R569 Unspecified convulsions: Secondary | ICD-10-CM | POA: Diagnosis not present

## 2023-11-11 DIAGNOSIS — G40909 Epilepsy, unspecified, not intractable, without status epilepticus: Secondary | ICD-10-CM | POA: Diagnosis present

## 2023-11-11 DIAGNOSIS — Z86718 Personal history of other venous thrombosis and embolism: Secondary | ICD-10-CM

## 2023-11-11 DIAGNOSIS — W1839XA Other fall on same level, initial encounter: Secondary | ICD-10-CM | POA: Diagnosis present

## 2023-11-11 DIAGNOSIS — F1090 Alcohol use, unspecified, uncomplicated: Secondary | ICD-10-CM | POA: Diagnosis not present

## 2023-11-11 DIAGNOSIS — E876 Hypokalemia: Secondary | ICD-10-CM | POA: Diagnosis present

## 2023-11-11 DIAGNOSIS — F17210 Nicotine dependence, cigarettes, uncomplicated: Secondary | ICD-10-CM | POA: Diagnosis present

## 2023-11-11 DIAGNOSIS — D509 Iron deficiency anemia, unspecified: Secondary | ICD-10-CM | POA: Diagnosis present

## 2023-11-11 DIAGNOSIS — F10239 Alcohol dependence with withdrawal, unspecified: Secondary | ICD-10-CM | POA: Diagnosis present

## 2023-11-11 LAB — RAPID URINE DRUG SCREEN, HOSP PERFORMED
Amphetamines: NOT DETECTED
Barbiturates: NOT DETECTED
Benzodiazepines: NOT DETECTED
Cocaine: NOT DETECTED
Opiates: NOT DETECTED
Tetrahydrocannabinol: NOT DETECTED

## 2023-11-11 LAB — PHOSPHORUS: Phosphorus: 3.8 mg/dL (ref 2.5–4.6)

## 2023-11-11 LAB — ECHOCARDIOGRAM COMPLETE
Area-P 1/2: 5.38 cm2
S' Lateral: 2.7 cm
Weight: 2500.9 [oz_av]

## 2023-11-11 LAB — CBC
HCT: 27.6 % — ABNORMAL LOW (ref 36.0–46.0)
Hemoglobin: 9.7 g/dL — ABNORMAL LOW (ref 12.0–15.0)
MCH: 29.5 pg (ref 26.0–34.0)
MCHC: 35.1 g/dL (ref 30.0–36.0)
MCV: 83.9 fL (ref 80.0–100.0)
Platelets: 191 K/uL (ref 150–400)
RBC: 3.29 MIL/uL — ABNORMAL LOW (ref 3.87–5.11)
RDW: 15.7 % — ABNORMAL HIGH (ref 11.5–15.5)
WBC: 7.3 K/uL (ref 4.0–10.5)
nRBC: 0 % (ref 0.0–0.2)

## 2023-11-11 LAB — OSMOLALITY, URINE: Osmolality, Ur: 313 mosm/kg (ref 300–900)

## 2023-11-11 LAB — COMPREHENSIVE METABOLIC PANEL WITH GFR
ALT: 15 U/L (ref 0–44)
AST: 21 U/L (ref 15–41)
Albumin: 3.3 g/dL — ABNORMAL LOW (ref 3.5–5.0)
Alkaline Phosphatase: 52 U/L (ref 38–126)
Anion gap: 13 (ref 5–15)
BUN: 11 mg/dL (ref 6–20)
CO2: 21 mmol/L — ABNORMAL LOW (ref 22–32)
Calcium: 9.2 mg/dL (ref 8.9–10.3)
Chloride: 93 mmol/L — ABNORMAL LOW (ref 98–111)
Creatinine, Ser: 1.09 mg/dL — ABNORMAL HIGH (ref 0.44–1.00)
GFR, Estimated: >60 mL/min (ref >60)
Glucose, Bld: 107 mg/dL — ABNORMAL HIGH (ref 70–99)
Potassium: 3.7 mmol/L (ref 3.5–5.1)
Sodium: 127 mmol/L — ABNORMAL LOW (ref 135–145)
Total Bilirubin: 1.1 mg/dL (ref 0.0–1.2)
Total Protein: 7.2 g/dL (ref 6.5–8.1)

## 2023-11-11 LAB — TSH: TSH: 0.625 u[IU]/mL (ref 0.350–4.500)

## 2023-11-11 LAB — URINALYSIS, ROUTINE W REFLEX MICROSCOPIC
Bilirubin Urine: NEGATIVE
Glucose, UA: NEGATIVE mg/dL
Ketones, ur: NEGATIVE mg/dL
Leukocytes,Ua: NEGATIVE
Nitrite: NEGATIVE
Protein, ur: >=300 mg/dL — AB
Specific Gravity, Urine: 1.005 (ref 1.005–1.030)
pH: 6 (ref 5.0–8.0)

## 2023-11-11 LAB — TROPONIN I (HIGH SENSITIVITY): Troponin I (High Sensitivity): 72 ng/L — ABNORMAL HIGH (ref <18)

## 2023-11-11 LAB — MAGNESIUM: Magnesium: 3.1 mg/dL — ABNORMAL HIGH (ref 1.7–2.4)

## 2023-11-11 LAB — CREATININE, URINE, RANDOM: Creatinine, Urine: 41 mg/dL

## 2023-11-11 LAB — CORTISOL-AM, BLOOD: Cortisol - AM: 16.1 ug/dL (ref 6.7–22.6)

## 2023-11-11 LAB — SODIUM, URINE, RANDOM: Sodium, Ur: 98 mmol/L

## 2023-11-11 MED ORDER — ENOXAPARIN SODIUM 40 MG/0.4ML IJ SOSY
40.0000 mg | PREFILLED_SYRINGE | INTRAMUSCULAR | Status: DC
  Administered 2023-11-12 (×2): 40 mg via SUBCUTANEOUS
  Filled 2023-11-11 (×2): qty 0.4

## 2023-11-11 MED ORDER — LACTATED RINGERS IV BOLUS
1000.0000 mL | Freq: Once | INTRAVENOUS | Status: AC
  Administered 2023-11-11 (×2): 1000 mL via INTRAVENOUS

## 2023-11-11 MED ORDER — DIAZEPAM 5 MG/ML IJ SOLN: 5.0000 mg | INTRAMUSCULAR | Status: DC | PRN

## 2023-11-11 MED ORDER — DIAZEPAM 5 MG/ML IJ SOLN: 5.0000 mg | Freq: Once | INTRAMUSCULAR | Status: DC

## 2023-11-11 NOTE — Progress Notes (Signed)
 PT Cancellation Note  Patient Details Name: Casey [name] MRN: [id number] DOB: Jun 14, 1970   Cancelled Treatment:    Reason Eval/Treat Not Completed: Medical issues which prohibited therapy  Imminent discharge order acknowledged.  Spoke with RN, patient's BP has been running low this morning (SBP in 80s.)  I checked on pt this morning in ED. Her MAP is currently <60. Not appropriate for comprehensive PT evaluation at this time. Will follow closely and when BP more stable attempt physical therapy evaluation. Please secure chat myself if more urgent follow-up needed.    Leontine Roads, PT, DPT [facility]  Rehabilitation Services Physical Therapist Office: [phone] Website: [email or web address]  Leontine GORMAN Roads 11/11/2023, 9:23 AM

## 2023-11-11 NOTE — Evaluation (Signed)
 Occupational Therapy Evaluation Patient Details Name: Casey [name] MRN: [id number] DOB: 1970-11-19 Today's Date: 11/11/2023   History of Present Illness   Pt is a 53 y.o. female presenting 8/10 after being found down, confused at the end of her driveway next to the trash can. Found to have hyponatremia, AKI. PMH: mural thrombus of cardiac apex, cardiomyopathy, GERD, HLD, microcytic anemia, thrombocytopenia, NSVT, alcohol use disorder.     Clinical Impressions PTA, pt reports she lived alone and was independent in the home; does not drive, and children bring her groceries. Upon eval, pt with decreased cardiopulmonary status with HR elevation to 168 with standing at EOB, thus, eval limited by this; supervision for very short distance mobility and for EOB ADL tasks., Pt with decreased orientation, memory, and problem solving as well during session; unsure if baseline. Would recommend increased supervision from family during medication management and ETOH counseling. Will follow for one more session to optimize safety with return to ADL due to limited eval today.      If plan is discharge home, recommend the following:   A little help with walking and/or transfers;A little help with bathing/dressing/bathroom;Assistance with cooking/housework;Direct supervision/assist for medications management;Direct supervision/assist for financial management;Assist for transportation;Help with stairs or ramp for entrance     Functional Status Assessment   Patient has had a recent decline in their functional status and demonstrates the ability to make significant improvements in function in a reasonable and predictable amount of time.     Equipment Recommendations   None recommended by OT     Recommendations for Other Services         Precautions/Restrictions   Precautions Precautions: Other (comment) (watch HR) Recall of Precautions/Restrictions: Impaired Precaution/Restrictions  Comments: poor safety awareness     Mobility Bed Mobility Overal bed mobility: Modified Independent                  Transfers Overall transfer level: Needs assistance Equipment used: None Transfers: Sit to/from Stand Sit to Stand: Supervision           General transfer comment: for safety      Balance Overall balance assessment: Mild deficits observed, not formally tested                                         ADL either performed or assessed with clinical judgement   ADL Overall ADL's : Needs assistance/impaired Eating/Feeding: Independent   Grooming: Set up;Sitting Grooming Details (indicate cue type and reason): unable to assess in standing due to pt with elevated HR Upper Body Bathing: Set up;Sitting   Lower Body Bathing: Supervison/ safety;Sit to/from stand   Upper Body Dressing : Set up;Sitting   Lower Body Dressing: Supervision/safety;Sit to/from stand   Toilet Transfer: Supervision/safety;Ambulation           Functional mobility during ADLs: Supervision/safety       Vision Baseline Vision/History: 0 No visual deficits Ability to See in Adequate Light: 0 Adequate Patient Visual Report: No change from baseline Vision Assessment?: No apparent visual deficits     Perception Perception: Not tested       Praxis Praxis: Not tested       Pertinent Vitals/Pain Pain Assessment Pain Assessment: No/denies pain     Extremity/Trunk Assessment Upper Extremity Assessment Upper Extremity Assessment: Generalized weakness;Right hand dominant   Lower Extremity Assessment Lower Extremity Assessment: Defer to PT  evaluation       Communication Communication Communication: No apparent difficulties   Cognition Arousal: Alert Behavior During Therapy: Flat affect Cognition: Cognition impaired   Orientation impairments: Time (able to report month and day; min cues for year. Per RN has had difficulty with orientation throughout  the day) Awareness: Intellectual awareness impaired, Online awareness impaired Memory impairment (select all impairments): Short-term memory, Declarative long-term memory Attention impairment (select first level of impairment): Sustained attention Executive functioning impairment (select all impairments): Problem solving, Reasoning OT - Cognition Comments: impulsive, flat affect throughout session; suspect near baseline                 Following commands: Impaired Following commands impaired: Follows one step commands with increased time, Follows multi-step commands inconsistently     Cueing  General Comments   Cueing Techniques: Verbal cues;Gestural cues  supine BP 112/69 (81) HR 101; seated 99/84 (91) HR 116; standing 102/91 (89) HR 148 progressing to 168 with standing ~2 mins   Exercises     Shoulder Instructions      Home Living Family/patient expects to be discharged to:: Private residence Living Arrangements: Alone Available Help at Discharge: Family Type of Home: Apartment Home Access: Stairs to enter Secretary/administrator of Steps: 3   Home Layout: One level     Bathroom Shower/Tub: Chief Strategy Officer: Standard     Home Equipment: None          Prior Functioning/Environment Prior Level of Function : Independent/Modified Independent               ADLs Comments: Family provides transportation. Pt denies recent falls other than one prior to admission. Overall describes lifestyle as a limited community ambulator. Typical day she will watch TV and go to mailbox. Family takes her to the store and helps with errands; doesnot drive    OT Problem List: Decreased strength;Decreased activity tolerance;Impaired balance (sitting and/or standing);Decreased cognition;Decreased safety awareness;Decreased knowledge of use of DME or AE   OT Treatment/Interventions: Self-care/ADL training;Therapeutic exercise;DME and/or AE instruction;Therapeutic  activities;Cognitive remediation/compensation;Patient/family education;Balance training      OT Goals(Current goals can be found in the care plan section)   Acute Rehab OT Goals Patient Stated Goal: pt did not state OT Goal Formulation: With patient Time For Goal Achievement: 11/25/23 Potential to Achieve Goals: Good   OT Frequency:  Min 2X/week    Co-evaluation              AM-PAC OT 6 Clicks Daily Activity     Outcome Measure Help from another person eating meals?: None Help from another person taking care of personal grooming?: A Little Help from another person toileting, which includes using toliet, bedpan, or urinal?: A Little Help from another person bathing (including washing, rinsing, drying)?: A Little Help from another person to put on and taking off regular upper body clothing?: None Help from another person to put on and taking off regular lower body clothing?: A Little 6 Click Score: 20   End of Session Nurse Communication: Mobility status  Activity Tolerance: Patient tolerated treatment well Patient left: in bed;with call bell/phone within reach;with family/visitor present;with bed alarm set  OT Visit Diagnosis: Unsteadiness on feet (R26.81);Muscle weakness (generalized) (M62.81);Other symptoms and signs involving cognitive function;History of falling (Z91.81)                Time: 1600-1620 OT Time Calculation (min): 20 min Charges:  OT General Charges $OT Visit: 1 Visit OT Evaluation $  OT Eval Low Complexity: 1 Low  Casey [name], OTR/L [facility] Acute Rehabilitation Office: [phone]   Casey JONETTA Lebron 11/11/2023, 4:49 PM

## 2023-11-11 NOTE — Progress Notes (Signed)
 PT Cancellation Note  Patient Details Name: Casey [name] MRN: [id number] DOB: [birth year]   Cancelled Treatment:    Reason Eval/Treat Not Completed: Medical issues which prohibited therapy  Pt resting in bed, HR in 150s. Recently finished working with OT for evaluation. OT reports mildly orthostatic, pt states asymptomatic with short distance ambulation in room.  Will hold formal PT evaluation due to significant tachycardia at rest. Plan for follow-up in AM.  Leontine Roads, PT, DPT [facility]  Rehabilitation Services Physical Therapist Office: [phone] Website: [email or web address]   Leontine GORMAN Roads 11/11/2023, 4:19 PM

## 2023-11-11 NOTE — Progress Notes (Signed)
 CSW added substance abuse resources to patient's AVS.  Niels Portugal, MSW, LCSW Transitions of Care  Clinical Social Worker II [phone]

## 2023-11-11 NOTE — Discharge Instructions (Addendum)
 Thank you for trusting us  with your care. You were admitted to the hospital because of episodes of passing out (syncope)   FOLLOW UP APPOINTMENTS:  Please visit  primary care doctor in 7-14 days  Please make sure:   1.If you notice any palpitations, chest pain, or trouble breathing, please contact your healthcare provider right away.  2.Be open about your alcohol use, and if you're interested in quitting, your provider can help support you.  3.Let your doctor know if you feel dizzy, lightheaded, or faint at any time.  4.Take your medications exactly as prescribed and keep all your follow-up visits.  Remember to: Take atorvastatin  80 mg once daily Take metoprolol  100 mg 2 times daily Take your Eliquis  5 mg 2 times daily  Please call your PCP or our clinic if you have any questions or concerns, we may be able to help and keep you from a long and expensive emergency room wait. Our clinic and after hours phone number is [phone]. The best time to call is Monday through Friday 9 am to 4 pm but there is always someone available 24/7 if you have an emergency. If you need medication refills please notify your pharmacy one week in advance and they will send us  a request.   We are glad you are feeling better,  Armando Rossetti Internal Medicine Inpatient Teaching Service at [facility]

## 2023-11-11 NOTE — Progress Notes (Signed)
  Echocardiogram 2D Echocardiogram has been performed.  Tinnie FORBES Gosling RDCS 11/11/2023, 12:48 PM

## 2023-11-11 NOTE — Procedures (Signed)
 Patient Name: Casey [name]  MRN: [id number]  Epilepsy Attending: Arlin MALVA Krebs  Referring Physician/Provider: Elnora Ip, MD  Date: 11/11/2023 Duration: 24.10 mins  Patient history: 53yo female with pertinent history of alcohol use disorder was found down outside her home by her family. Unclear history, however possible post-ictal state with reported confusion and decreased arousability by family and EMS. EEG to evaluate for seizure  Level of alertness: Awake  AEDs during EEG study: None  Technical aspects: This EEG study was done with scalp electrodes positioned according to the 10-20 International system of electrode placement. Electrical activity was reviewed with band pass filter of 1-70Hz , sensitivity of 7 uV/mm, display speed of 75mm/sec with a 60Hz  notched filter applied as appropriate. EEG data were recorded continuously and digitally stored.  Video monitoring was available and reviewed as appropriate.  Description: The posterior dominant rhythm consists of 8 Hz activity of moderate voltage (25-35 uV) seen predominantly in posterior head regions, symmetric and reactive to eye opening and eye closing. Hyperventilation did not show any EEG change.  Physiologic photic driving was not seen during photic stimulation.  Of note, parts of study were technically difficult due to significant movement artifact.   IMPRESSION: This technically difficult study is within normal limits. No seizures or epileptiform discharges were seen throughout the recording.  A normal interictal EEG does not exclude the diagnosis of epilepsy.   Oryon Gary O Ishmeal Rorie

## 2023-11-11 NOTE — Progress Notes (Addendum)
 HD#0 SUBJECTIVE:  Patient Summary:Casey [name] is a 53 y.o. person living with a history of alcohol use disorder, tobacco abuse, mural thrombus of cardiac apex, cardiomyopathy, GERD, microcytic anemia, and HTN who presented after an unwitnessed fall at home and admitted for a syncope work up on hospital day 1  Overnight Events: N/A  Interim History:  The patient is stable and aware that her blood pressure was elevated and that she has a pacemaker. She reports an argument with her daughter over trash, after which she suddenly found herself in an ambulance. She thinks she may have passed out during transport but denies dizziness or headache at that time. This is her first episode of passing out, and she did not feel dizzy beforehand. Orthostatic vitals were normal earlier. She denies cough, shortness of breath, fever, or congestion. She has not consumed alcohol since last week. Currently, she has no headache or tremors when holding out her hands and is able to squeeze the examiner's hand. She has a tongue laceration from biting herself during the episode. The plan includes echocardiogram, ICD check, and EEG. The event is most likely a seizure, although she has no prior history or family history of seizures and no history of withdrawal. She consents to contact her daughter.  OBJECTIVE:  Vital Signs: Vitals:   11/11/23 0245 11/11/23 0323 11/11/23 0400 11/11/23 0615  BP: 137/81  133/71 120/86  Pulse: (!) 107  (!) 102 (!) 105  Resp: 16  19   Temp:  100.3 F (37.9 C)    TempSrc:  Oral    SpO2: 99%  98%   Weight:       Supplemental O2: Room Air SpO2: 98 %  Filed Weights   11/10/23 1656  Weight: 73.5 kg     Intake/Output Summary (Last 24 hours) at 11/11/2023 0755 Last data filed at 11/11/2023 0104 Gross per 24 hour  Intake 2147.87 ml  Output --  Net 2147.87 ml   Net IO Since Admission: 2,147.87 mL [11/11/23 0755]  Physical Exam: Constitutional: well-appearing female in no  acute distress HENT: normocephalic atraumatic, mucous membranes moist Eyes: conjunctiva non-erythematous Neck: supple Cardiovascular: Tachycardic, No murmurs, rubs, or gallops. No pitting edema bilaterally  Pulmonary/Chest: normal work of breathing on room air, lungs clear to auscultation bilaterally Abdominal: soft, non-tender, non-distended MSK: normal bulk and tone Neurological: alert & oriented x 3, PERRL Skin: warm and dry  Patient Lines/Drains/Airways Status     Active Line/Drains/Airways     Name Placement date Placement time Site Days   Peripheral IV 11/11/23 22 G Posterior;Right Hand 11/11/23  0615  Hand  less than 1            Pertinent labs and imaging:      Latest Ref Rng & Units 11/11/2023    4:09 AM 11/10/2023    5:03 PM 06/09/2022    2:02 AM  CBC  WBC 4.0 - 10.5 K/uL 7.3  7.9    Hemoglobin 12.0 - 15.0 g/dL 9.7  88.1  88.7   Hematocrit 36.0 - 46.0 % 27.6  33.9  33.0   Platelets 150 - 400 K/uL 191  254         Latest Ref Rng & Units 11/11/2023    4:09 AM 11/10/2023   10:01 PM 11/10/2023    5:03 PM  CMP  Glucose 70 - 99 mg/dL 892  882  845   BUN 6 - 20 mg/dL 11  12  13    Creatinine 0.44 -  1.00 mg/dL 8.90  8.96  8.74   Sodium 135 - 145 mmol/L 127  126  126   Potassium 3.5 - 5.1 mmol/L 3.7  3.8  3.9   Chloride 98 - 111 mmol/L 93  91  90   CO2 22 - 32 mmol/L 21  21  17    Calcium  8.9 - 10.3 mg/dL 9.2  9.3  89.8   Total Protein 6.5 - 8.1 g/dL 7.2   8.6   Total Bilirubin 0.0 - 1.2 mg/dL 1.1   0.8   Alkaline Phos 38 - 126 U/L 52   66   AST 15 - 41 U/L 21   26   ALT 0 - 44 U/L 15   14     CT Cervical Spine Wo Contrast Result Date: 11/10/2023 CLINICAL DATA:  Neck trauma, dangerous injury mechanism (Age 44-64y) EXAM: CT CERVICAL SPINE WITHOUT CONTRAST TECHNIQUE: Multidetector CT imaging of the cervical spine was performed without intravenous contrast. Multiplanar CT image reconstructions were also generated. RADIATION DOSE REDUCTION: This exam was performed  according to the departmental dose-optimization program which includes automated exposure control, adjustment of the mA and/or kV according to patient size and/or use of iterative reconstruction technique. COMPARISON:  June 08, 2022 FINDINGS: Alignment: Normal alignment with appropriate cervical lordosis. No spondylolisthesis, uncovering of the facet joints, or significant widening of the spinous processes. No subluxation or abnormality identified at the craniovertebral junction. Skull base and vertebrae: Vertebral body heights are preserved. No acute fracture. No primary bone lesion or focal pathologic process.The lateral masses of C1 are well aligned with C2. The odontoid is intact. Soft tissues and spinal canal: No prevertebral edema or soft tissue thickening. No visible canal hematoma. Disc levels: Mild intervertebral disc height loss at C4-C5 and C5-C6 with osteophyte formation. Circumferential disc bulges at these levels cause mild spinal canal narrowing. Upper chest: No focal airspace consolidation or pleural effusion. Other: Calcified atherosclerosis in the right carotid artery. IMPRESSION: 1. No acute fracture or traumatic malalignment of the cervical spine. 2. Mild degenerative disc disease at C4-C5 and C5-C6. Electronically Signed   By: Rogelia Myers M.D.   On: 11/10/2023 17:57   CT Head Wo Contrast Result Date: 11/10/2023 CLINICAL DATA:  Head trauma, abnormal mental status (Age 20-64y) EXAM: CT HEAD WITHOUT CONTRAST TECHNIQUE: Contiguous axial images were obtained from the base of the skull through the vertex without intravenous contrast. RADIATION DOSE REDUCTION: This exam was performed according to the departmental dose-optimization program which includes automated exposure control, adjustment of the mA and/or kV according to patient size and/or use of iterative reconstruction technique. COMPARISON:  June 09, 2022 FINDINGS: Brain: The ventricles appear age appropriate. No mass effect or midline  shift. Gray-white differentiation is preserved without focal attenuation abnormality.No evidence of acute territorial infarction, extra-axial fluid collection, hemorrhage, or mass lesion. The basilar cisterns are patent without downward herniation. The cerebellar hemispheres and vermis are well formed without mass lesion or focal attenuation abnormality. Vascular: No hyperdense vessel. Skull: Normal. Negative for fracture or focal lesion. Sinuses/Orbits: The paranasal sinuses and mastoids are clear.The globes appear intact. No retrobulbar hematoma. Other: None. IMPRESSION: No acute intracranial abnormality, specifically, no acute hemorrhage, territorial infarction, or intracranial mass. Electronically Signed   By: Rogelia Myers M.D.   On: 11/10/2023 17:48   DG Pelvis Portable Result Date: 11/10/2023 CLINICAL DATA:  Fall EXAM: PORTABLE PELVIS 1-2 VIEWS COMPARISON:  None Available. FINDINGS: SI joints are non widened. Pubic symphysis and rami appear intact. Limited assessment of  right femoral neck due to superimposition of trochanter and positioning. No dislocation IMPRESSION: Limited assessment of the right femoral neck due to positioning and superimposition of trochanter. No definite acute osseous abnormality. Cross-sectional imaging follow-up if persistent concern for pelvic or hip fracture Electronically Signed   By: Luke Bun M.D.   On: 11/10/2023 17:25   DG Chest Port 1 View Result Date: 11/10/2023 EXAM: 1 VIEW XRAY OF THE CHEST 11/10/2023 05:17:00 PM COMPARISON: 09/29/2018 CLINICAL HISTORY: Fall. BIB [facility] from home s/p unwitnessed sz-like activity with fall onto concrete. Initially unresponsive. Soaked of urine. No oral trauma. Unresponsive, followed by postictal on EMS arrival to scene. Gradually arousable to A\T\Ox4. Daughter at home and called EMS. Pt was taking the trash out at time of event. Unsure of hitting head. No wounds noted. Reportedly takes blood thinner and sz meds. FINDINGS: LUNGS AND  PLEURA: No focal pulmonary opacity. No pulmonary edema. No pleural effusion. No pneumothorax. HEART AND MEDIASTINUM: Moderate cardiomegaly. No congestive heart failure. ICD takes a circuitous route over the right atrium, terminating over the right ventricle. BONES AND SOFT TISSUES: No acute osseous abnormality. Multiple wires and leads project over the chest on the frontal radiograph. IMPRESSION: 1. No acute process. 2. Moderate cardiomegaly without congestive heart failure. Electronically signed by: Rockey Kilts MD 11/10/2023 05:25 PM EDT RP Workstation: [id number]    ASSESSMENT/PLAN:  Assessment: Principal Problem:   Syncope Active Problems:   Tobacco use disorder   Hx of apical thrombus in 2018   Cardiomyopathy, ischemic   Chronic anticoagulation   Hypomagnesemia   Hyponatremia   Alcohol use disorder   AKI (acute kidney injury) (HCC)   Chronic heart failure with reduced ejection fraction (HFrEF, <= 40%) (HCC)   Elevated troponin   Elevated serum protein level   Alcohol withdrawal (HCC)   Plan:   Casey [name] is a 53 y.o. person living with a history of alcohol use disorder, tobacco abuse, mural thrombus of cardiac apex, cardiomyopathy, GERD, microcytic anemia, and HTN who presented after an unwitnessed fall at home and admitted for a syncope work up on hospital day 1.   #Syncope #Suspected Alcohol Withdrawal # Withdrawal seizure  A 53 year old female with a history of alcohol use disorder was found unresponsive outside her home by family members. History is limited, but her presentation appeared consistent with a possible post-ictal state, including confusion and decreased responsiveness as reported by EMS and family as well as tongue bite.  1.She was orthostatic on evaluation. 2.EEG showed no seizures or epileptiform activity; however, a normal interictal EEG does not rule out epilepsy.  3.Echo: Echocardiogram shows improved LVEF from 30-35% to 50-55% with low-normal left  ventricular function, mild basal septal hypertrophy, regional wall motion abnormalities, normal diastolic function, and no significant valvular disease. 4.Interrogation of ICD showed 2 episodes of SVT and one was 3:36 and based on her daughter hx the syncope time was around 3:40. Differential includes alcohol withdrawal seizure, cardiogenic syncope due to SVT, and orthostatic hypotension. Plan: -Seizure precautions  #AUD Per family, she consumes alcohol daily with multiple beers and 1/2 bottle of liquor.  - CIWA protocol  - valium  5 mg once - Thiamine  and folic acid   HFrEF Ischemic Cardiomyopathy Hx of NSVT - PT with longstanding hx of HFrEF. Echocardiogram shows improved LVEF from 30-35% to 50-55% with low-normal left ventricular function, mild basal septal hypertrophy, regional wall motion abnormalities, normal diastolic function, and no significant valvular disease. - DC entresto  due to elevated creatinine - Currently without  exacerbation and peripherally euvolemic - Continue  metoprolol  100mg  daily.  - Continuous telemetry - Daily weights - Strict I's/O's - Mg > 2 and K > 4   Hyponatremia Electrolyte Abnormalities Na is 127 today up from 126. Hypoosmolar hyponatremia in a euvolemic patient with hyperosmolar urine and urine sodium >40. SIADH is suspected, with adrenal insufficiency and hypothyroidism ruled out (TSH 0.6, cortisol 16). Likely related to poor nutritional intake and chronic alcohol use (family reports patient often eats only once daily and consumes significant alcohol). -BMP will be repeated to assess response.  AGMA Resolved. Bicarb 21 and anion gap 13.    AKI -Pt without known history of kidney disease. Scr elevated 1.25 on admission. Suspect pre-renal with pt being found down and history of poor oral intake. Will follow with repeat BMP tonight.  - If no improvement of Scr, could be intra-renal pathology due to elevated protein and possible monoclonal gammopathy  process.  - UA pending.    Hx of apical thrombus -Echo in 2020 without evidence of thrombus. Last echo in 2024 again without evidence of thrombus. Last visualized in 2018.  Echocardiogram shows improved LVEF from 30-35% to 50-55% with low-normal left ventricular function, mild basal septal hypertrophy, regional wall motion abnormalities, no thrombus visualized.   Best practice: Diet: Heart Healthy VTE: DOAC IVF: None,None Code: Full   Disposition planning: Prior to Admission Living Arrangement: Home, living with family  Anticipated Discharge Location: Home   Dispo: Admit patient to Observation with expected length of stay less than 2 midnights. Signature:  Lylith Bebeau [facility]  7:55 AM, 11/11/2023  On Call pager [phone]

## 2023-11-11 NOTE — ED Notes (Signed)
 Patient ambulated to bathroom with no assistance, steady gait, denies complaints.

## 2023-11-12 DIAGNOSIS — R55 Syncope and collapse: Secondary | ICD-10-CM | POA: Diagnosis not present

## 2023-11-12 DIAGNOSIS — I5022 Chronic systolic (congestive) heart failure: Secondary | ICD-10-CM | POA: Diagnosis not present

## 2023-11-12 DIAGNOSIS — R000 Tachycardia, unspecified: Secondary | ICD-10-CM

## 2023-11-12 DIAGNOSIS — F10129 Alcohol abuse with intoxication, unspecified: Secondary | ICD-10-CM

## 2023-11-12 DIAGNOSIS — I255 Ischemic cardiomyopathy: Secondary | ICD-10-CM | POA: Diagnosis not present

## 2023-11-12 DIAGNOSIS — E871 Hypo-osmolality and hyponatremia: Secondary | ICD-10-CM | POA: Diagnosis not present

## 2023-11-12 DIAGNOSIS — R931 Abnormal findings on diagnostic imaging of heart and coronary circulation: Secondary | ICD-10-CM

## 2023-11-12 LAB — BASIC METABOLIC PANEL WITH GFR
Anion gap: 10 (ref 5–15)
BUN: 13 mg/dL (ref 6–20)
CO2: 23 mmol/L (ref 22–32)
Calcium: 9.1 mg/dL (ref 8.9–10.3)
Chloride: 98 mmol/L (ref 98–111)
Creatinine, Ser: 0.91 mg/dL (ref 0.44–1.00)
GFR, Estimated: >60 mL/min (ref >60)
Glucose, Bld: 108 mg/dL — ABNORMAL HIGH (ref 70–99)
Potassium: 3.3 mmol/L — ABNORMAL LOW (ref 3.5–5.1)
Sodium: 131 mmol/L — ABNORMAL LOW (ref 135–145)

## 2023-11-12 LAB — CBC
HCT: 25.4 % — ABNORMAL LOW (ref 36.0–46.0)
Hemoglobin: 8.8 g/dL — ABNORMAL LOW (ref 12.0–15.0)
MCH: 29.6 pg (ref 26.0–34.0)
MCHC: 34.6 g/dL (ref 30.0–36.0)
MCV: 85.5 fL (ref 80.0–100.0)
Platelets: 143 K/uL — ABNORMAL LOW (ref 150–400)
RBC: 2.97 MIL/uL — ABNORMAL LOW (ref 3.87–5.11)
RDW: 15.9 % — ABNORMAL HIGH (ref 11.5–15.5)
WBC: 6.8 K/uL (ref 4.0–10.5)
nRBC: 0 % (ref 0.0–0.2)

## 2023-11-12 LAB — UREA NITROGEN, URINE: Urea Nitrogen, Ur: 176 mg/dL

## 2023-11-12 MED ORDER — APIXABAN 5 MG PO TABS
5.0000 mg | ORAL_TABLET | Freq: Two times a day (BID) | ORAL | Status: DC
  Administered 2023-11-12 – 2023-11-13 (×4): 5 mg via ORAL
  Filled 2023-11-12 (×2): qty 1

## 2023-11-12 MED ORDER — METOPROLOL SUCCINATE ER 100 MG PO TB24
100.0000 mg | ORAL_TABLET | Freq: Two times a day (BID) | ORAL | Status: DC
  Administered 2023-11-12 – 2023-11-13 (×4): 100 mg via ORAL
  Filled 2023-11-12 (×2): qty 1

## 2023-11-12 MED ORDER — ASPIRIN 81 MG PO TBEC
81.0000 mg | DELAYED_RELEASE_TABLET | Freq: Every day | ORAL | Status: DC
  Administered 2023-11-12 (×2): 81 mg via ORAL
  Filled 2023-11-12: qty 1

## 2023-11-12 MED ORDER — SACUBITRIL-VALSARTAN 24-26 MG PO TABS
1.0000 | ORAL_TABLET | Freq: Two times a day (BID) | ORAL | Status: DC
  Administered 2023-11-12 – 2023-11-13 (×4): 1 via ORAL
  Filled 2023-11-12 (×2): qty 1

## 2023-11-12 MED ORDER — POTASSIUM CHLORIDE CRYS ER 20 MEQ PO TBCR
40.0000 meq | EXTENDED_RELEASE_TABLET | Freq: Two times a day (BID) | ORAL | Status: AC
Start: 2023-11-12 — End: 2023-11-12
  Administered 2023-11-12 (×4): 40 meq via ORAL
  Filled 2023-11-12 (×2): qty 2

## 2023-11-12 NOTE — Progress Notes (Signed)
 HD#1 SUBJECTIVE:  Patient Summary: Casey [name] is a 53 y.o. person living with a history of alcohol use disorder, tobacco abuse, mural thrombus of cardiac apex, cardiomyopathy, GERD, microcytic anemia, and HTN who presented after an unwitnessed fall at home and admitted for a syncope work up.  Overnight Events: N/A  Interim History:  Patient slept okay but found the bed uncomfortable. No headache. Ate breakfast. No memory of events before waking in the ambulance. Denies chest pain, palpitations, dizziness, or hand tremors before the incident. Concerned about possibility of seizure but denies visual or auditory hallucinations. Lungs sound clear. Fast heart rate attributed to syncope and alcohol cessation-related seizure. Patient is confused about how she got outside; explained that poor blood flow can cause memory loss. Syncope (passing out) was discussed. She is unsure why she passed out in the rain, and we acknowledged that the cause is currently unknown.  OBJECTIVE:  Vital Signs: Vitals:   11/12/23 0505 11/12/23 0800 11/12/23 0850 11/12/23 0917  BP: 120/73  98/63 (!) 137/95  Pulse: (!) 135 (!) 128  (!) 162  Resp: 18 18 18    Temp: 99.3 F (37.4 C)     TempSrc: Oral     SpO2: 100%     Weight: 71.9 kg      Supplemental O2: Room Air SpO2: 100 %  Filed Weights   11/10/23 1656 11/11/23 0938 11/12/23 0505  Weight: 73.5 kg 70.9 kg 71.9 kg     Intake/Output Summary (Last 24 hours) at 11/12/2023 1120 Last data filed at 11/12/2023 0900 Gross per 24 hour  Intake 240 ml  Output --  Net 240 ml   Net IO Since Admission: 2,387.87 mL [11/12/23 1120]  Physical Exam: Constitutional: well-appearing female in no acute distress HENT: normocephalic atraumatic, mucous membranes moist Eyes: conjunctiva non-erythematous Neck: supple Cardiovascular: Tachycardic, No murmurs, rubs, or gallops. No pitting edema bilaterally  Pulmonary/Chest: normal work of breathing on room air, lungs clear  to auscultation bilaterally Abdominal: soft, non-tender, non-distended MSK: normal bulk and tone Neurological: alert & oriented x 3, PERRL Skin: warm and dry Patient Lines/Drains/Airways Status     Active Line/Drains/Airways     Name Placement date Placement time Site Days   Peripheral IV 11/11/23 22 G Posterior;Right Hand 11/11/23  0615  Hand  1            Pertinent labs and imaging:      Latest Ref Rng & Units 11/12/2023    2:25 AM 11/11/2023    4:09 AM 11/10/2023    5:03 PM  CBC  WBC 4.0 - 10.5 K/uL 6.8  7.3  7.9   Hemoglobin 12.0 - 15.0 g/dL 8.8  9.7  88.1   Hematocrit 36.0 - 46.0 % 25.4  27.6  33.9   Platelets 150 - 400 K/uL 143  191  254        Latest Ref Rng & Units 11/12/2023    2:25 AM 11/11/2023    4:09 AM 11/10/2023   10:01 PM  CMP  Glucose 70 - 99 mg/dL 891  892  882   BUN 6 - 20 mg/dL 13  11  12    Creatinine 0.44 - 1.00 mg/dL 9.08  8.90  8.96   Sodium 135 - 145 mmol/L 131  127  126   Potassium 3.5 - 5.1 mmol/L 3.3  3.7  3.8   Chloride 98 - 111 mmol/L 98  93  91   CO2 22 - 32 mmol/L 23  21  21   Calcium  8.9 - 10.3 mg/dL 9.1  9.2  9.3   Total Protein 6.5 - 8.1 g/dL  7.2    Total Bilirubin 0.0 - 1.2 mg/dL  1.1    Alkaline Phos 38 - 126 U/L  52    AST 15 - 41 U/L  21    ALT 0 - 44 U/L  15      ECHOCARDIOGRAM COMPLETE Result Date: 11/11/2023    ECHOCARDIOGRAM REPORT   Patient Name:   Casey [name] Date of Exam: 11/11/2023 Medical Rec #:  [id number]          Height:       65.0 in Accession #:    [id number]         Weight:       156.3 lb Date of Birth:  1970-05-24          BSA:          1.781 m Patient Age:    53 years           BP:           128/77 mmHg Patient Gender: F                  HR:           104 bpm. Exam Location:  Inpatient Procedure: 2D Echo, Color Doppler and Cardiac Doppler (Both Spectral and Color            Flow Doppler were utilized during procedure). Indications:    Syncope R55, Cardiomyopathy-Ischemic I25.5  History:        Patient has  prior history of Echocardiogram examinations, most                 recent 06/09/2022.  Sonographer:    Tinnie Gosling RDCS Referring Phys: HADASSAH ALA  Sonographer Comments: Technically difficult study due to poor echo windows. IMPRESSIONS  1. Left ventricular ejection fraction, by estimation, is 50 to 55%. The left ventricle has low normal function. The left ventricle demonstrates regional wall motion abnormalities (see scoring diagram/findings for description). There is mild left ventricular hypertrophy of the basal-septal segment. Left ventricular diastolic parameters were normal.  2. Right ventricular systolic function is normal. The right ventricular size is normal.  3. The mitral valve is grossly normal. No evidence of mitral valve regurgitation. No evidence of mitral stenosis.  4. The aortic valve is normal in structure. Aortic valve regurgitation is not visualized. No aortic stenosis is present.  5. The inferior vena cava is normal in size with greater than 50% respiratory variability, suggesting right atrial pressure of 3 mmHg. Comparison(s): A prior study was performed on 06/09/2022. LVEF improved from 30-35% to 50-55%. FINDINGS  Left Ventricle: Left ventricular ejection fraction, by estimation, is 50 to 55%. The left ventricle has low normal function. The left ventricle demonstrates regional wall motion abnormalities. The left ventricular internal cavity size was normal in size. There is mild left ventricular hypertrophy of the basal-septal segment. Left ventricular diastolic parameters were normal.  LV Wall Scoring: The inferior septum is hypokinetic. Right Ventricle: The right ventricular size is normal. Right vetricular wall thickness was not well visualized. Right ventricular systolic function is normal. Left Atrium: Left atrial size was normal in size. Right Atrium: Right atrial size was normal in size. Pericardium: There is no evidence of pericardial effusion. Mitral Valve: The mitral valve  is grossly normal. No evidence of mitral valve regurgitation. No evidence of mitral valve stenosis.  Tricuspid Valve: The tricuspid valve is grossly normal. Tricuspid valve regurgitation is not demonstrated. No evidence of tricuspid stenosis. Aortic Valve: The aortic valve is normal in structure. Aortic valve regurgitation is not visualized. No aortic stenosis is present. Pulmonic Valve: The pulmonic valve was normal in structure. Pulmonic valve regurgitation is not visualized. No evidence of pulmonic stenosis. Aorta: The aortic root and ascending aorta are structurally normal, with no evidence of dilitation. Venous: The inferior vena cava is normal in size with greater than 50% respiratory variability, suggesting right atrial pressure of 3 mmHg. IAS/Shunts: The interatrial septum was not well visualized. Additional Comments: A device lead is visualized.  LEFT VENTRICLE PLAX 2D LVIDd:         4.70 cm   Diastology LVIDs:         2.70 cm   LV e' medial:    4.24 cm/s LV PW:         1.30 cm   LV E/e' medial:  13.7 LV IVS:        1.30 cm   LV e' lateral:   7.51 cm/s LVOT diam:     2.10 cm   LV E/e' lateral: 7.7 LV SV:         38 LV SV Index:   21 LVOT Area:     3.46 cm  RIGHT VENTRICLE         IVC TAPSE (M-mode): 1.2 cm  IVC diam: 1.50 cm LEFT ATRIUM         Index LA diam:    3.10 cm 1.74 cm/m  AORTIC VALVE LVOT Vmax:   88.60 cm/s LVOT Vmean:  51.000 cm/s LVOT VTI:    0.109 m  AORTA Ao Root diam: 2.80 cm Ao Asc diam:  2.70 cm MITRAL VALVE MV Area (PHT): 5.38 cm    SHUNTS MV Decel Time: 141 msec    Systemic VTI:  0.11 m MV E velocity: 58.20 cm/s  Systemic Diam: 2.10 cm MV A velocity: 72.80 cm/s MV E/A ratio:  0.80 Sunit Tolia Electronically signed by Madonna Large Signature Date/Time: 11/11/2023/2:36:15 PM    Final    EEG adult Result Date: 11/11/2023 Shelton Arlin KIDD, MD     11/11/2023 12:45 PM Patient Name: Casey [name] MRN: [id number] Epilepsy Attending: Arlin KIDD Shelton Referring Physician/Provider:  Elnora Ip, MD Date: 11/11/2023 Duration: 24.10 mins Patient history: 53yo female with pertinent history of alcohol use disorder was found down outside her home by her family. Unclear history, however possible post-ictal state with reported confusion and decreased arousability by family and EMS. EEG to evaluate for seizure Level of alertness: Awake AEDs during EEG study: None Technical aspects: This EEG study was done with scalp electrodes positioned according to the 10-20 International system of electrode placement. Electrical activity was reviewed with band pass filter of 1-70Hz , sensitivity of 7 uV/mm, display speed of 73mm/sec with a 60Hz  notched filter applied as appropriate. EEG data were recorded continuously and digitally stored.  Video monitoring was available and reviewed as appropriate. Description: The posterior dominant rhythm consists of 8 Hz activity of moderate voltage (25-35 uV) seen predominantly in posterior head regions, symmetric and reactive to eye opening and eye closing. Hyperventilation did not show any EEG change.  Physiologic photic driving was not seen during photic stimulation. Of note, parts of study were technically difficult due to significant movement artifact. IMPRESSION: This technically difficult study is within normal limits. No seizures or epileptiform discharges were seen throughout the recording. A normal interictal  EEG does not exclude the diagnosis of epilepsy. Arlin MALVA Krebs    ASSESSMENT/PLAN:  Assessment: Principal Problem:   Syncope Active Problems:   Tobacco use disorder   Hx of apical thrombus in 2018   Cardiomyopathy, ischemic   Chronic anticoagulation   Hypomagnesemia   Hyponatremia   Alcohol use disorder   AKI (acute kidney injury) (HCC)   Chronic heart failure with reduced ejection fraction (HFrEF, <= 40%) (HCC)   Elevated troponin   Elevated serum protein level   Alcohol withdrawal (HCC)   HFrEF (heart failure with reduced ejection  fraction) (HCC)   Seizure-like activity (HCC)   Sinus tachycardia   Regional wall motion abnormality of heart   Plan:  Casey [name] is a 53 y.o. person living with a history of alcohol use disorder, tobacco abuse, mural thrombus of cardiac apex, cardiomyopathy, GERD, microcytic anemia, and HTN who presented after an unwitnessed fall at home and admitted for a syncope work up on hospital day 3.   #Syncope #Suspected Alcohol Withdrawal # Withdrawal seizure A 53 year old female with a history of alcohol use disorder was found unresponsive outside her home. Her symptoms, including confusion, decreased responsiveness, and tongue biting, suggest a possible post-ictal state. On evaluation, she was orthostatic. EEG was negative for seizures, though epilepsy cannot be excluded. Echocardiogram shows improved but still low-normal left ventricular function with regional wall motion abnormalities in inferior wall (previous echo had wall motion abnormality in anterior wall). ICD interrogation revealed two episodes of SVT, one coinciding with her syncopal event. The differential diagnosis includes alcohol withdrawal seizure, cardiogenic syncope due to SVT, and orthostatic hypotension.  -seizure precautions -CIWA protocol for alcohol withdrawal management -Consult cardiology for evaluation of wall motion abnormalities and SVT episodes and Assess need for antiarrhythmic therapy -Monitor vital signs, especially orthostatic changes   #AUD Per family, she consumes alcohol daily with multiple beers and 1/2 bottle of liquor.  - CIWA protocol is 0 - Thiamine  and folic acid    #HFrEF Ischemic Cardiomyopathy Hx of NSVT - PT with longstanding hx of HFrEF. Echocardiogram shows improved LVEF from 30-35% to 50-55% with low-normal left ventricular function, mild basal septal hypertrophy, regional wall motion abnormalities, normal diastolic function, and no significant valvular disease. - DC entresto  due to  elevated creatinine - Currently without exacerbation and peripherally euvolemic - Continue  metoprolol  100mg  daily.  - Continuous telemetry - Daily weights - Strict I's/O's - Mg > 2 and K > 4   #Hyponatremia Electrolyte Abnormalities Improved. Na is 131 today up from 127. Hypoosmolar hyponatremia in a euvolemic patient with hyperosmolar urine and urine sodium >40. SIADH is suspected, with adrenal insufficiency and hypothyroidism ruled out (TSH 0.6, cortisol 16). Likely related to poor nutritional intake and chronic alcohol use.  #AGMA Resolved. Bicarb 23 and anion gap 10.    #AKI -Pt without known history of kidney disease. Scr elevated 1.25 on admission. Suspect pre-renal with pt being found down and history of poor oral intake. Will follow with repeat BMP tonight.  - If no improvement of Scr, could be intra-renal pathology due to elevated protein and possible monoclonal gammopathy process.  - UA pending.    #Hx of apical thrombus -Echo in 2020 without evidence of thrombus. Last echo in 2024 again without evidence of thrombus. Last visualized in 2018.  Echocardiogram shows improved LVEF from 30-35% to 50-55% with low-normal left ventricular function, mild basal septal hypertrophy, regional wall motion abnormalities, no thrombus visualized.   Best practice: Diet: Heart Healthy VTE:  SCD IVF: None,None Code: Full   Disposition planning: Prior to Admission Living Arrangement: Home, living with family  Anticipated Discharge Location: Home   Dispo: Admit patient to Observation with expected length of stay less than 2 midnights. Signature:  Armando Bernadine [facility]  11:20 AM, 11/12/2023  On Call pager [phone]

## 2023-11-12 NOTE — Progress Notes (Signed)
 Mobility Specialist Progress Note:   11/12/23 0925  Mobility  Activity Ambulated with assistance  Level of Assistance Standby assist, set-up cues, supervision of patient - no hands on  Assistive Device None  Distance Ambulated (ft) 300 ft  Activity Response Tolerated well  Mobility Referral Yes  Mobility visit 1 Mobility  Mobility Specialist Start Time (ACUTE ONLY) 0925  Mobility Specialist Stop Time (ACUTE ONLY) 0935  Mobility Specialist Time Calculation (min) (ACUTE ONLY) 10 min   Pt agreeable to mobility session. HR low 100s upon arrival. Pt HR severely elevated with ambulation, up to 197. Asx throughout. RN in room to assess. Recovered with seated rest. BP post-ambulation: 137/95 (106).  Therisa Rana Mobility Specialist Please contact via SecureChat or  Rehab office at [phone]

## 2023-11-12 NOTE — Discharge Summary (Signed)
 Name: ARDA DAGGS MRN: [id number] DOB: 01-14-1971 53 y.o. PCP: Haze Kingfisher, MD  Date of Admission: 11/10/2023  4:46 PM Date of Discharge: 11/13/2023 Attending Physician: Dr. Mliss Foot  Discharge Diagnosis: 1. Principal Problem:   Syncope Active Problems:   Tobacco use disorder   Hx of apical thrombus in 2018   Cardiomyopathy, ischemic   Chronic anticoagulation   Hypomagnesemia   Hyponatremia   Alcohol use disorder   AKI (acute kidney injury) (HCC)   Chronic heart failure with reduced ejection fraction (HFrEF, <= 40%) (HCC)   Elevated troponin   Elevated serum protein level   Alcohol withdrawal (HCC)   HFrEF (heart failure with reduced ejection fraction) (HCC)   Seizure-like activity (HCC)   Sinus tachycardia   Regional wall motion abnormality of heart   SVT (supraventricular tachycardia) (HCC)    Discharge Medications: Allergies as of 11/13/2023       Reactions   Bempedoic Acid Other (See Comments)   hallucinations        Medication List     TAKE these medications    atorvastatin  80 MG tablet Commonly known as: LIPITOR  TAKE 1 TABLET EVERY DAY What changed: when to take this   Eliquis  5 MG Tabs tablet Generic drug: apixaban  TAKE 1 TABLET TWICE DAILY   Entresto  24-26 MG Generic drug: sacubitril -valsartan  TAKE 1 TABLET TWICE DAILY   ezetimibe  10 MG tablet Commonly known as: ZETIA  TAKE 1 TABLET EVERY DAY What changed: when to take this   metoprolol  succinate 100 MG 24 hr tablet Commonly known as: TOPROL -XL Take 1 tablet (100 mg total) by mouth 2 (two) times daily.   pantoprazole  40 MG tablet Commonly known as: PROTONIX  TAKE 1 TABLET EVERY DAY        Disposition and follow-up:   Ms.Dari V Finau was discharged from [facility] in Stable condition.  At the hospital follow up visit please address:  1.Monitor the patient for any episodes of palpitations, chest pain, or shortness of breath. 2.Assess for  ongoing alcohol use disorder and discuss the patient's interest in quitting alcohol; offer support or referral as needed. 3.Evaluate the patient for any episodes of dizziness, lightheadedness, or syncope. 4.Labs / imaging needed at time of follow-up: CBC, CMP, LFT   Follow-up Appointments:  Follow-up Information     Paliwal, Himanshu, MD. Go in 6 day(s).   Specialty: Family Medicine Why: 11/19/2023 @11 :00 A.M. Contact information: [address] [place] [address] [phone]                  Hospital Course by problem list: MANIE BEALER is a 53 y.o. person living with a history of alcohol use disorder, tobacco abuse, mural thrombus of cardiac apex, cardiomyopathy, GERD, microcytic anemia, and HTN who presented after an unwitnessed fall at home and admitted for a syncope work up, now being discharged on hospital day 4 with the following pertinent hospital course:   #Syncope and Arrhythmia: The patient presented with syncope and was found unresponsive outside her home. Evaluation included ICD interrogation, which showed normal device function but detected brief episodes of paroxysmal supraventricular tachycardia (SVT), likely atrial tachycardia exactly on time of syncope. These SVT episodes were not temporally associated with changes in mental status or syncope during the hospital stay, suggesting they were unlikely the primary cause of the syncopal event.  Her echocardiogram demonstrated improved but still low-normal left ventricular function with regional wall motion abnormalities, consistent with ischemic cardiomyopathy. The patient's home metoprolol  (Toprol -XL)  regimen of 100 mg twice daily was resumed to help suppress arrhythmias. No further ischemic or electrophysiological evaluation was deemed necessary at this time.  The differential diagnosis for syncope includes cardiogenic causes related to arrhythmia (SVT), orthostatic hypotension, and alcohol withdrawal  seizures. Close cardiac monitoring with continuous telemetry was maintained, along with frequent vital signs and orthostatic assessments. Overall, the syncope was most likely multifactorial, with SVT being a contributor but not the sole cause, and no evidence of malignant arrhythmia or device malfunction was found. Cardiology recommends continuing Toprol -XL 100 mg twice daily as per her home regimen and maintaining Eliquis  if previously prescribed. Given stable cardiac anatomy and resolved LV thrombus, no further ischemic evaluation is needed.  # Suspected Alcohol Withdrawal / Withdrawal Seizure: Patient's presentation included confusion and tongue biting consistent with possible withdrawal seizure. CIWA protocol was initiated without Ativan  and it was negative, and thiamine  and folic acid  supplementation started. Alcohol cessation was encouraged. Seizure precautions were implemented. EEG was negative for ongoing seizures but epilepsy was not ruled out.  #Heart Failure with Reduced Ejection Fraction (HFrEF): Patient has ischemic cardiomyopathy with improved LVEF (30-35% to 50-55%) but persistent regional wall motion abnormalities. Entresto  was discontinued due to elevated creatinine. Metoprolol  continued at home dose. Patient remains euvolemic without signs of exacerbation. Telemetry, daily weights, and strict I/O monitoring continued. Electrolytes maintained within target range.  #Hyponatremia: Hyponatremia improved from 127 to 131. 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 Adrenal insufficiency and hypothyroidism were excluded.  #History of Apical Thrombus: No thrombus visualized on recent echocardiograms. Anticoagulation (Eliquis ) to be continued if taken at home.   Stable chronic medical conditions: Essential hypertension Cardiomyopathy GERD  Subjective Denies palpitations, dizziness, lightheaded. Denies any further LOC. No new acute  concerns at this time  Discharge Exam:   BP 118/74 (BP Location: Left Arm)   Pulse 81   Temp (!) 97.4 F (36.3 C) (Oral)   Resp 20   Wt 72.9 kg   LMP 09/29/2018 (Exact Date)   SpO2 100%   BMI 26.74 kg/m  Constitutional: well-appearing female in no acute distress HENT: normocephalic atraumatic, mucous membranes moist Eyes: conjunctiva non-erythematous Neck: supple Cardiovascular: Tachycardic, No murmurs, rubs, or gallops. No pitting edema bilaterally  Pulmonary/Chest: normal work of breathing on room air, lungs clear to auscultation bilaterally Abdominal: soft, non-tender, non-distended MSK: normal bulk and tone Neurological: alert & oriented x 3, PERRL Skin: warm and dry  Pertinent Labs, Studies, and Procedures:     Latest Ref Rng & Units 11/13/2023    2:40 AM 11/12/2023    2:25 AM 11/11/2023    4:09 AM  CBC  WBC 4.0 - 10.5 K/uL 5.9  6.8  7.3   Hemoglobin 12.0 - 15.0 g/dL 8.7  8.8  9.7   Hematocrit 36.0 - 46.0 % 25.4  25.4  27.6   Platelets 150 - 400 K/uL 126  143  191        Latest Ref Rng & Units 11/13/2023    9:10 AM 11/13/2023    2:40 AM 11/12/2023    2:25 AM  CMP  Glucose 70 - 99 mg/dL  99  891   BUN 6 - 20 mg/dL  13  13   Creatinine 9.55 - 1.00 mg/dL  9.16  9.08   Sodium 864 - 145 mmol/L  132  131   Potassium 3.5 - 5.1 mmol/L 4.5  5.2  3.3   Chloride 98 - 111 mmol/L  101  98   CO2 22 - 32 mmol/L  23  23   Calcium  8.9 - 10.3 mg/dL  9.6  9.1     ECHOCARDIOGRAM COMPLETE Result Date: 11/11/2023    ECHOCARDIOGRAM REPORT   Patient Name:   KYLEIGHA MARKERT Date of Exam: 11/11/2023 Medical Rec #:  [id number]          Height:       65.0 in Accession #:    [id number]         Weight:       156.3 lb Date of Birth:  1970-04-04          BSA:          1.781 m Patient Age:    53 years           BP:           128/77 mmHg Patient Gender: F                  HR:           104 bpm. Exam Location:  Inpatient Procedure: 2D Echo, Color Doppler and Cardiac Doppler (Both Spectral and  Color            Flow Doppler were utilized during procedure). Indications:    Syncope R55, Cardiomyopathy-Ischemic I25.5  History:        Patient has prior history of Echocardiogram examinations, most                 recent 06/09/2022.  Sonographer:    Tinnie Gosling RDCS Referring Phys: HADASSAH ALA  Sonographer Comments: Technically difficult study due to poor echo windows. IMPRESSIONS  1. Left ventricular ejection fraction, by estimation, is 50 to 55%. The left ventricle has low normal function. The left ventricle demonstrates regional wall motion abnormalities (see scoring diagram/findings for description). There is mild left ventricular hypertrophy of the basal-septal segment. Left ventricular diastolic parameters were normal.  2. Right ventricular systolic function is normal. The right ventricular size is normal.  3. The mitral valve is grossly normal. No evidence of mitral valve regurgitation. No evidence of mitral stenosis.  4. The aortic valve is normal in structure. Aortic valve regurgitation is not visualized. No aortic stenosis is present.  5. The inferior vena cava is normal in size with greater than 50% respiratory variability, suggesting right atrial pressure of 3 mmHg. Comparison(s): A prior study was performed on 06/09/2022. LVEF improved from 30-35% to 50-55%. FINDINGS  Left Ventricle: Left ventricular ejection fraction, by estimation, is 50 to 55%. The left ventricle has low normal function. The left ventricle demonstrates regional wall motion abnormalities. The left ventricular internal cavity size was normal in size. There is mild left ventricular hypertrophy of the basal-septal segment. Left ventricular diastolic parameters were normal.  LV Wall Scoring: The inferior septum is hypokinetic. Right Ventricle: The right ventricular size is normal. Right vetricular wall thickness was not well visualized. Right ventricular systolic function is normal. Left Atrium: Left atrial size was normal  in size. Right Atrium: Right atrial size was normal in size. Pericardium: There is no evidence of pericardial effusion. Mitral Valve: The mitral valve is grossly normal. No evidence of mitral valve regurgitation. No evidence of mitral valve stenosis. Tricuspid Valve: The tricuspid valve is grossly normal. Tricuspid valve regurgitation is not demonstrated. No evidence of tricuspid stenosis. Aortic Valve: The aortic valve is normal in structure. Aortic valve regurgitation is not visualized. No aortic stenosis is present. Pulmonic Valve: The pulmonic  valve was normal in structure. Pulmonic valve regurgitation is not visualized. No evidence of pulmonic stenosis. Aorta: The aortic root and ascending aorta are structurally normal, with no evidence of dilitation. Venous: The inferior vena cava is normal in size with greater than 50% respiratory variability, suggesting right atrial pressure of 3 mmHg. IAS/Shunts: The interatrial septum was not well visualized. Additional Comments: A device lead is visualized.  LEFT VENTRICLE PLAX 2D LVIDd:         4.70 cm   Diastology LVIDs:         2.70 cm   LV e' medial:    4.24 cm/s LV PW:         1.30 cm   LV E/e' medial:  13.7 LV IVS:        1.30 cm   LV e' lateral:   7.51 cm/s LVOT diam:     2.10 cm   LV E/e' lateral: 7.7 LV SV:         38 LV SV Index:   21 LVOT Area:     3.46 cm  RIGHT VENTRICLE         IVC TAPSE (M-mode): 1.2 cm  IVC diam: 1.50 cm LEFT ATRIUM         Index LA diam:    3.10 cm 1.74 cm/m  AORTIC VALVE LVOT Vmax:   88.60 cm/s LVOT Vmean:  51.000 cm/s LVOT VTI:    0.109 m  AORTA Ao Root diam: 2.80 cm Ao Asc diam:  2.70 cm MITRAL VALVE MV Area (PHT): 5.38 cm    SHUNTS MV Decel Time: 141 msec    Systemic VTI:  0.11 m MV E velocity: 58.20 cm/s  Systemic Diam: 2.10 cm MV A velocity: 72.80 cm/s MV E/A ratio:  0.80 Sunit Tolia Electronically signed by Madonna Large Signature Date/Time: 11/11/2023/2:36:15 PM    Final    EEG adult Result Date: 11/11/2023 Shelton Arlin KIDD,  MD     11/11/2023 12:45 PM Patient Name: ERNESTEEN MIHALIC MRN: [id number] Epilepsy Attending: Arlin KIDD Shelton Referring Physician/Provider: Elnora Ip, MD Date: 11/11/2023 Duration: 24.10 mins Patient history: 53yo female with pertinent history of alcohol use disorder was found down outside her home by her family. Unclear history, however possible post-ictal state with reported confusion and decreased arousability by family and EMS. EEG to evaluate for seizure Level of alertness: Awake AEDs during EEG study: None Technical aspects: This EEG study was done with scalp electrodes positioned according to the 10-20 International system of electrode placement. Electrical activity was reviewed with band pass filter of 1-70Hz , sensitivity of 7 uV/mm, display speed of 67mm/sec with a 60Hz  notched filter applied as appropriate. EEG data were recorded continuously and digitally stored.  Video monitoring was available and reviewed as appropriate. Description: The posterior dominant rhythm consists of 8 Hz activity of moderate voltage (25-35 uV) seen predominantly in posterior head regions, symmetric and reactive to eye opening and eye closing. Hyperventilation did not show any EEG change.  Physiologic photic driving was not seen during photic stimulation. Of note, parts of study were technically difficult due to significant movement artifact. IMPRESSION: This technically difficult study is within normal limits. No seizures or epileptiform discharges were seen throughout the recording. A normal interictal EEG does not exclude the diagnosis of epilepsy. Arlin KIDD Shelton   CT Cervical Spine Wo Contrast Result Date: 11/10/2023 CLINICAL DATA:  Neck trauma, dangerous injury mechanism (Age 25-64y) EXAM: CT CERVICAL SPINE WITHOUT CONTRAST TECHNIQUE: Multidetector CT imaging of the  cervical spine was performed without intravenous contrast. Multiplanar CT image reconstructions were also generated. RADIATION DOSE REDUCTION:  This exam was performed according to the departmental dose-optimization program which includes automated exposure control, adjustment of the mA and/or kV according to patient size and/or use of iterative reconstruction technique. COMPARISON:  June 08, 2022 FINDINGS: Alignment: Normal alignment with appropriate cervical lordosis. No spondylolisthesis, uncovering of the facet joints, or significant widening of the spinous processes. No subluxation or abnormality identified at the craniovertebral junction. Skull base and vertebrae: Vertebral body heights are preserved. No acute fracture. No primary bone lesion or focal pathologic process.The lateral masses of C1 are well aligned with C2. The odontoid is intact. Soft tissues and spinal canal: No prevertebral edema or soft tissue thickening. No visible canal hematoma. Disc levels: Mild intervertebral disc height loss at C4-C5 and C5-C6 with osteophyte formation. Circumferential disc bulges at these levels cause mild spinal canal narrowing. Upper chest: No focal airspace consolidation or pleural effusion. Other: Calcified atherosclerosis in the right carotid artery. IMPRESSION: 1. No acute fracture or traumatic malalignment of the cervical spine. 2. Mild degenerative disc disease at C4-C5 and C5-C6. Electronically Signed   By: Rogelia Myers M.D.   On: 11/10/2023 17:57   CT Head Wo Contrast Result Date: 11/10/2023 CLINICAL DATA:  Head trauma, abnormal mental status (Age 18-64y) EXAM: CT HEAD WITHOUT CONTRAST TECHNIQUE: Contiguous axial images were obtained from the base of the skull through the vertex without intravenous contrast. RADIATION DOSE REDUCTION: This exam was performed according to the departmental dose-optimization program which includes automated exposure control, adjustment of the mA and/or kV according to patient size and/or use of iterative reconstruction technique. COMPARISON:  June 09, 2022 FINDINGS: Brain: The ventricles appear age appropriate. No  mass effect or midline shift. Gray-white differentiation is preserved without focal attenuation abnormality.No evidence of acute territorial infarction, extra-axial fluid collection, hemorrhage, or mass lesion. The basilar cisterns are patent without downward herniation. The cerebellar hemispheres and vermis are well formed without mass lesion or focal attenuation abnormality. Vascular: No hyperdense vessel. Skull: Normal. Negative for fracture or focal lesion. Sinuses/Orbits: The paranasal sinuses and mastoids are clear.The globes appear intact. No retrobulbar hematoma. Other: None. IMPRESSION: No acute intracranial abnormality, specifically, no acute hemorrhage, territorial infarction, or intracranial mass. Electronically Signed   By: Rogelia Myers M.D.   On: 11/10/2023 17:48   DG Pelvis Portable Result Date: 11/10/2023 CLINICAL DATA:  Fall EXAM: PORTABLE PELVIS 1-2 VIEWS COMPARISON:  None Available. FINDINGS: SI joints are non widened. Pubic symphysis and rami appear intact. Limited assessment of right femoral neck due to superimposition of trochanter and positioning. No dislocation IMPRESSION: Limited assessment of the right femoral neck due to positioning and superimposition of trochanter. No definite acute osseous abnormality. Cross-sectional imaging follow-up if persistent concern for pelvic or hip fracture Electronically Signed   By: Luke Bun M.D.   On: 11/10/2023 17:25   DG Chest Port 1 View Result Date: 11/10/2023 EXAM: 1 VIEW XRAY OF THE CHEST 11/10/2023 05:17:00 PM COMPARISON: 09/29/2018 CLINICAL HISTORY: Fall. BIB [facility] from home s/p unwitnessed sz-like activity with fall onto concrete. Initially unresponsive. Soaked of urine. No oral trauma. Unresponsive, followed by postictal on EMS arrival to scene. Gradually arousable to A\T\Ox4. Daughter at home and called EMS. Pt was taking the trash out at time of event. Unsure of hitting head. No wounds noted. Reportedly takes blood thinner and sz  meds. FINDINGS: LUNGS AND PLEURA: No focal pulmonary opacity. No pulmonary edema. No pleural  effusion. No pneumothorax. HEART AND MEDIASTINUM: Moderate cardiomegaly. No congestive heart failure. ICD takes a circuitous route over the right atrium, terminating over the right ventricle. BONES AND SOFT TISSUES: No acute osseous abnormality. Multiple wires and leads project over the chest on the frontal radiograph. IMPRESSION: 1. No acute process. 2. Moderate cardiomegaly without congestive heart failure. Electronically signed by: Rockey Kilts MD 11/10/2023 05:25 PM EDT RP Workstation: [id number]     Discharge Instructions: Discharge Instructions     Call MD for:  difficulty breathing, headache or visual disturbances   Complete by: As directed    Call MD for:  persistant dizziness or light-headedness   Complete by: As directed    Diet - low sodium heart healthy   Complete by: As directed    Increase activity slowly   Complete by: As directed       Thank you for trusting us  with your care. You were admitted to the hospital because of episodes of passing out (syncope)   FOLLOW UP APPOINTMENTS:  Please visit  primary care doctor in 7-14 days.  Please make sure:   1.If you notice any palpitations, chest pain, or trouble breathing, please contact your healthcare provider right away. 2.Be open about your alcohol use, and if you're interested in quitting, your provider can help support you. 3.Let your doctor know if you feel dizzy, lightheaded, or faint at any time. 4.Take your medications exactly as prescribed and keep all your follow-up visits.  Remember to: Take atorvastatin  80 mg once daily Take metoprolol  100 mg 2 times daily Take your Eliquis  5 mg 2 times daily  Please call your PCP or our clinic if you have any questions or concerns, we may be able to help and keep you from a long and expensive emergency room wait. Our clinic and after hours phone number is [phone]. The best time to  call is Monday through Friday 9 am to 4 pm but there is always someone available 24/7 if you have an emergency. If you need medication refills please notify your pharmacy one week in advance and they will send us  a request.   We are glad you are feeling better,  Armando Rossetti Internal Medicine Inpatient Teaching Service at [facility]  Signed: Rossetti Armando, MD 11/13/2023, 5:00 PM

## 2023-11-12 NOTE — Evaluation (Signed)
 Physical Therapy Evaluation Patient Details Name: Casey [name] MRN: [id number] DOB: Feb 24, 1971 Today's Date: 11/12/2023  History of Present Illness  Pt is a 53 y.o. female presenting 8/10 after being found down, confused at the end of her driveway next to the trash can. Found to have hyponatremia, AKI. PMH: mural thrombus of cardiac apex, cardiomyopathy, GERD, HLD, microcytic anemia, thrombocytopenia, NSVT, alcohol use disorder.   Clinical Impression  Pt in bed upon arrival of PT, agreeable to evaluation at this time. Prior to admission the pt was independent with all mobility, reports no recent falls or need for DME, but largely sedentary in recliner playing games throughout every day. The pt reports living alone but with multiple family members who can assist as needed after d/c. The pt was able to complete bed mobility without issue, and completes OOB transfer and gait with CGA to supervision for line management and safety. Pt HR better controlled at this time, increasing to 130bpm only with gait this session. The pt demos poor tolerance of balance challenge, with increased sway, slowed speeds, and generally limited participation. The pt could benefit from continued skilled PT to progress functional endurance and dynamic stability after d/c to improve baseline mobility and safety in the home. Will continue to follow acutely.          If plan is discharge home, recommend the following: Help with stairs or ramp for entrance;Assist for transportation   Can travel by private vehicle        Equipment Recommendations None recommended by PT  Recommendations for Other Services       Functional Status Assessment Patient has had a recent decline in their functional status and demonstrates the ability to make significant improvements in function in a reasonable and predictable amount of time.     Precautions / Restrictions Precautions Precautions: Other (comment) (watch HR) Recall of  Precautions/Restrictions: Impaired Precaution/Restrictions Comments: poor safety awareness, watch HR elevation Restrictions Weight Bearing Restrictions Per Provider Order: No      Mobility  Bed Mobility Overal bed mobility: Modified Independent             General bed mobility comments: long-sitting in bed upon arrival    Transfers Overall transfer level: Needs assistance Equipment used: None Transfers: Sit to/from Stand Sit to Stand: Supervision           General transfer comment: supervision for safety, BP stable today    Ambulation/Gait Ambulation/Gait assistance: Contact guard assist Gait Distance (Feet): 125 Feet Assistive device: None Gait Pattern/deviations: Step-through pattern, Decreased stride length, Drifts right/left Gait velocity: decreased but pt reports this is faster than her normal Gait velocity interpretation: <1.31 ft/sec, indicative of household ambulator   General Gait Details: pt with intermittent drifting with gait, no overt LOB. HR to 130bpm      Balance Overall balance assessment: Mild deficits observed, not formally tested                               Standardized Balance Assessment Standardized Balance Assessment : Dynamic Gait Index   Dynamic Gait Index Level Surface: Mild Impairment Change in Gait Speed: Moderate Impairment Gait with Horizontal Head Turns: Mild Impairment Gait with Vertical Head Turns: Mild Impairment Gait and Pivot Turn: Mild Impairment Step Over Obstacle: Moderate Impairment Step Around Obstacles: Mild Impairment Steps: Mild Impairment Total Score: 14       Pertinent Vitals/Pain Pain Assessment Pain Assessment: No/denies pain  Home Living Family/patient expects to be discharged to:: Private residence Living Arrangements: Alone Available Help at Discharge: Family Type of Home: Apartment Home Access: Stairs to enter   Secretary/administrator of Steps: 3   Home Layout: One  level Home Equipment: None      Prior Function Prior Level of Function : Independent/Modified Independent             Mobility Comments: pt reports independent but largely sedentary ADLs Comments: Family provides transportation. Pt denies recent falls other than one prior to admission. Overall describes lifestyle as a limited community ambulator. Typical day she will watch TV and go to mailbox. Family takes her to the store and helps with errands; doesnot drive     Extremity/Trunk Assessment   Upper Extremity Assessment Upper Extremity Assessment: Defer to OT evaluation    Lower Extremity Assessment Lower Extremity Assessment: Overall WFL for tasks assessed    Cervical / Trunk Assessment Cervical / Trunk Assessment: Normal  Communication   Communication Communication: No apparent difficulties    Cognition Arousal: Alert Behavior During Therapy: Flat affect   PT - Cognitive impairments: No family/caregiver present to determine baseline, Awareness, Problem solving, Safety/Judgement                       PT - Cognition Comments: pt able to follow simple commands, but self-limiting at times and demos poor general insight to health conditions and importance of vitals Following commands: Impaired Following commands impaired: Follows one step commands with increased time, Follows multi-step commands inconsistently     Cueing Cueing Techniques: Verbal cues, Gestural cues     General Comments      Exercises     Assessment/Plan    PT Assessment Patient needs continued PT services  PT Problem List Decreased strength;Decreased range of motion;Decreased activity tolerance;Decreased balance       PT Treatment Interventions DME instruction;Gait training;Stair training;Functional mobility training;Therapeutic activities;Therapeutic exercise;Balance training;Patient/family education    PT Goals (Current goals can be found in the Care Plan section)  Acute Rehab PT  Goals Patient Stated Goal: return home PT Goal Formulation: With patient Time For Goal Achievement: 11/26/23 Potential to Achieve Goals: Good    Frequency Min 2X/week        AM-PAC PT 6 Clicks Mobility  Outcome Measure Help needed turning from your back to your side while in a flat bed without using bedrails?: None Help needed moving from lying on your back to sitting on the side of a flat bed without using bedrails?: None Help needed moving to and from a bed to a chair (including a wheelchair)?: A Little Help needed standing up from a chair using your arms (e.g., wheelchair or bedside chair)?: A Little Help needed to walk in hospital room?: A Little Help needed climbing 3-5 steps with a railing? : A Little 6 Click Score: 20    End of Session Equipment Utilized During Treatment: Gait belt Activity Tolerance: Patient tolerated treatment well Patient left: in bed;with call bell/phone within reach Nurse Communication: Mobility status PT Visit Diagnosis: Unsteadiness on feet (R26.81);Other abnormalities of gait and mobility (R26.89)    Time: 8883-8865 PT Time Calculation (min) (ACUTE ONLY): 18 min   Charges:   PT Evaluation $PT Eval Low Complexity: 1 Low   PT General Charges $$ ACUTE PT VISIT: 1 Visit         Izetta Call, PT, DPT   Acute Rehabilitation Department Office [phone] Secure Chat Communication Preferred  Lilyian Quayle  JULIANNA Call 11/12/2023, 12:27 PM

## 2023-11-12 NOTE — Consult Note (Addendum)
 Cardiology Consultation   Patient ID: SCOUT GUYETT MRN: [id number]; DOB: 1970-06-15  Admit date: 11/10/2023 Date of Consult: 11/12/2023  PCP:  Haze Kingfisher, MD   [facility] Providers Cardiologist:  Redell Shallow, MD  Electrophysiologist:  Danelle Birmingham, MD    Patient Profile: YAKIMA KREITZER is a 53 y.o. female with a hx of ischemic cardiomyopathy, HFrEF with improved LVEF, CAD, NSVT, HTN, HLD, LV mural thrombus, tobacco use, ETOH abuse,  who is being seen 11/12/2023 for the evaluation of syncope at the request of Dr Lovie.  History of Present Illness: Ms. Landowski with above PMH who presented to ER 11/10/23 for syncope.  She presented to the ER 11/10/2023 altered mental status.  Per chart review, patient was found on the ground next to a trash can, was confused and not able to follow commands upon awakening.  She had no recollection of what happened.  The episode was not witnessed.  She was incontinent and reportedly bit her tongue.  The last thing she remembers was taking out trash right before she was fussing at her grandchildren.  She was easily frustrated and agitated during initial conversation with internal medicine service.  She reports active tobacco use and alcohol use, is not forthcoming quantity.  Her family had reported she drinks multiple beers and a half bottle of liquor on daily basis, although she reports she drinks twice a month.  She had hospitalization 06/2022 for syncope where she was concern for alcohol withdrawal seizure.  At the time she was drinking 1 bottle of vodka daily.   Upon encounter today, she is quite anxious with pressured speech. She states she needs to go home now. She does not recall how she fell or passed out. She last remembers laying in bed and watching TV. She recalls waking up with EMS staff around her near the ambulance. She states she was doing fine the past week, no chest pain, SOB, dizziness, syncope, ICD shock, heart  palpitation, fever, chills, emesis, abdominal pain, diarrhea, etc. She states she takes all her cardiac medication daily. She states she drinks as much as ETOH as she wishes, occasionally, denied any withdrawal symptoms. She also smokes as much as she wants, but felt this is not an issue for her heart since she already has blockage disease.  She has not followed up with Dr Shallow as she thought he has retired.   Admission labs from 11/10/2023 revealed hyponatremia 126, hypochloremia 90, bicarb 17, elevated creatinine 1.25, BUN 13, anion gap 19, GFR 52.  Magnesium  deficient 1.3.  CK WNL.  High sensitive troponin 34 >44>68>72.  CBC diff with hemoglobin 11.8.  Alcohol <15.  Lactic acid was normal.  Chest x-ray showed no acute process.  Pelvis x-ray, CT head, CT cervical spine revealed no acute traumatic finding.  11/11/23 showed normal TSH. UA + protein and bacteria. UDS negative. EEG from 11/11/2023 showed no seizure or epileptiform discharge. Echocardiogram from 11/11/2023 showed LVEF improved to 50 to 55%, mild left ventricular hypertrophy of basal septal segment, normal diastolic parameters, normal RV, no significant valvular disease, IVC normal.  There is hypokinesis of inferior septum.  ICD interrogation showed 2 episode of SVT. She was admitted to medicine service for syncope, AKI, hyponatremia, suspected alcohol withdrawal.  She was given IV fluids and AKI has resolved and hyponatremia had improved.  Cardiology is consulted today for possible cardiogenic syncope and wall motion abnormality noted on echo.   Per chart review, patient follows Dr. Shallow outpatient, had CAD  that was not revascularized.    She was hospitalized 08/2015 for abdominal pain, CT accidentally revealed LV apex thrombus.  Echocardiogram showed LVEF 35 to 40% with apical thrombus, anterior and apical wall motion abnormality, grade 2 DD, mild MR. Cardiac cath 08/2015 showed 90% proximal to mid LAD (possible dissection), 60% ramus and  50% right coronary artery.  She was recommended medical therapy given likely infarcted LAD territory and absence of chest pain.  Started on Eliquis  for LV thrombus.  She was admitted again 10/2015 for non-STEMI, self discontinued Eliquis  for anticoagulation.  She was having epigastric pain and nausea.  Repeat cath 10/10/2015 showed moderately severe LV dysfunction with an EF of 30-35% with hypokinesis of the anterior to anterolateral wall with akinesis to dyskinesis in the distal anterior apical and apical segment with suggestion of a large, smooth bordered, probable mural thrombus apically extending to the distal anterolateral wall and focal basal inferior hypocontractility. Previously documented dissection of the proximal LAD with improved filling and residual narrowing of 50-55%; 70% smooth eccentric proximal ramus stenosis, mild 20% left circumflex stenosis, and new total occlusion of the RCA at its origin with extensive left to right collaterals supplying almost the entire RCA territory distally to near [place].  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  Dr. Claudene had discussed the case with intervention team regarding possible intervention RCA, LAD, ramus intermedius, but felt outcome would be uncertain due to evidence of LAD dissection.  Surgical consultation was requested and patient was seen by Dr. Dusty, initially felt CABG would be an option but this was delayed due to need of therapeutic anticoagulation for LV thrombus for several month.  She ultimately followed up with Dr. Dusty outpatient and was recommended medical management.  She had been placed on Lipitor , Toprol -XL and lisinopril   for GDMT, no aspirin  due to Eliquis  use.  Repeat echo from 01/09/2016 with LVEF improved to 40-45%, diffuse hypokinesis,  Akinesis of the mid-apical anteroseptal, apical inferior, apical anterior, and apical myocardium.  Grade 3 DD.  Mild AI.  Mild mitral regurgitation.  Moderate LAE.  Mild TR.  Echo from 03/18/2017 revealed LVEF 30 to 35%, Akinesis of the mid-apicalanteroseptal and apical myocardium.  Grade 1 DD, apical thrombus of LV noted.  Echo from 08/13/2018 showed LVEF 30 to 35%, moderate hypertrophy of basal septum with otherwise mild concentric LVH, impaired relaxation, apical akinesis, no residual apical thrombus, trivial pericardial effusion.   She did fairly well without angina symptoms 2018-2020.  She was transitioned from lisinopril  to Entresto  06/2018.  She was referred to EP for ICD due to persistent low EF despite maximal medical therapy.  She underwent [employer] ICD implant 09/29/2018 by Dr. Waddell without complication.  She has not seen Dr. Pietro since 12/2020, was maintained on GDMT for CAD and ischemic cardiomyopathy at that time.  She last followed up with EP Brandi NP-C on 09/12/2023, denied any cardiac symptoms, denied any defibrillation therapy.  She was felt euvolemic on exam.  Device interrogation revealed normal function.  Most recent interrogation completed on 09/23/2023 revealed normal device function, V sensed rhythm, no new abnormal arrhythmia, battery and lead parameters were stable.     Past Medical History:  Diagnosis  Date   Anticoagulation adequate 10/14/2015   CAD (coronary artery disease)    Coronary atherosclerosis of native coronary artery, multivessel with plans for CABG to be followed   08/04/2015   Ischemic cardiomyopathy    Peripheral arterial disease (HCC)    UTI (urinary tract infection) 10/14/2015    Past Surgical History:  Procedure Laterality Date   CARDIAC CATHETERIZATION N/A 08/04/2015   Procedure: Coronary/Graft Angiography;  Surgeon:  Peter M Swaziland, MD;  Location[facility] INVASIVE CV LAB;  Service: Cardiovascular;  Laterality: N/A;   CARDIAC CATHETERIZATION N/A 10/10/2015   Procedure: Left Heart Cath and Coronary Angiography;  Surgeon: Debby DELENA Sor, MD;  Location: [facility] INVASIVE CV LAB;  Service: Cardiovascular;  Laterality: N/A;   CESAREAN SECTION     ICD IMPLANT N/A 09/29/2018   Procedure: ICD IMPLANT;  Surgeon: Waddell Danelle ORN, MD;  Location: [facility] INVASIVE CV LAB;  Service: Cardiovascular;  Laterality: N/A;     Home Medications:  Prior to Admission medications   Medication Sig Start Date End Date Taking? Authorizing Provider  atorvastatin  (LIPITOR ) 80 MG tablet TAKE 1 TABLET EVERY DAY Patient taking differently: Take 80 mg by mouth 2 (two) times daily. 06/10/23  Yes Pietro Redell RAMAN, MD  ELIQUIS  5 MG TABS tablet TAKE 1 TABLET TWICE DAILY 07/22/23  Yes Pietro Redell RAMAN, MD  ENTRESTO  24-26 MG TAKE 1 TABLET TWICE DAILY 01/04/23  Yes Pietro Redell RAMAN, MD  ezetimibe  (ZETIA ) 10 MG tablet TAKE 1 TABLET EVERY DAY Patient taking differently: Take 10 mg by mouth 2 (two) times daily. 08/12/23  Yes Pietro Redell RAMAN, MD  metoprolol  succinate (TOPROL -XL) 100 MG 24 hr tablet TAKE 1 TABLET EVERY DAY Patient taking differently: Take 100 mg by mouth 2 (two) times daily. 08/12/23  Yes Pietro Redell RAMAN, MD  pantoprazole  (PROTONIX ) 40 MG tablet TAKE 1 TABLET EVERY DAY Patient not taking: Reported on 11/10/2023 05/08/23   Pietro Redell RAMAN, MD    Scheduled Meds:  atorvastatin   80 mg Oral Daily   enoxaparin  (LOVENOX ) injection  40 mg Subcutaneous Q24H   ezetimibe   10 mg Oral Daily   folic acid   1 mg Oral Daily   metoprolol  succinate  100 mg Oral BID   multivitamin with minerals  1 tablet Oral Daily   nicotine   14 mg Transdermal Daily   pantoprazole   40 mg Oral Daily   potassium chloride   40 mEq Oral BID   senna  1 tablet Oral BID   thiamine   100 mg Oral Daily   Or   thiamine   100 mg Intravenous Daily   Continuous Infusions:  PRN  Meds: acetaminophen  **OR** acetaminophen   Allergies:    Allergies  Allergen Reactions   Bempedoic Acid Other (See Comments)    hallucinations    Social History:   Social History   Socioeconomic History   Marital status: Divorced    Spouse name: Not on file   Number of children: Not on file   Years of education: 9th grade   Highest education level: Not on file  Occupational History   Not on file  Tobacco Use   Smoking status: Some Days    Current packs/day: 0.50    Average packs/day: 0.5 packs/day for 25.0 years (12.5 ttl pk-yrs)    Types: Cigarettes   Smokeless tobacco: Never  Vaping Use   Vaping status: Never Used  Substance and Sexual Activity   Alcohol use: Yes    Alcohol/week: 0.0 standard drinks of alcohol    Comment:  Two drinks a night.   Drug use: No    Comment: No history of cocaine use   Sexual activity: Not on file  Other Topics Concern   Not on file  Social History Narrative   Not on file   Social Drivers of Health   Financial Resource Strain: Not on file  Food Insecurity: Not on file  Transportation Needs: Not on file  Physical Activity: Not on file  Stress: Not on file  Social Connections: Not on file  Intimate Partner Violence: Not on file    Family History:    Family History  Problem Relation Age of Onset   Diabetes Mellitus II Neg Hx    CAD Neg Hx    Breast cancer Neg Hx      ROS:  Constitutional: Denied fever, chills, malaise, night sweats Eyes: Denied vision change or loss Ears/Nose/Mouth/Throat: Denied ear ache, sore throat, coughing, sinus pain Cardiovascular: see HPI  Respiratory: see HPI Gastrointestinal: Denied nausea, vomiting, abdominal pain, diarrhea Genital/Urinary: Denied dysuria, hematuria, urinary frequency/urgency Musculoskeletal: Denied muscle ache, joint pain, weakness Skin: Denied rash, wound Neuro: see HPI  Psych: see HPI  Endocrine: Denied history of diabetes   Physical Exam/Data: Vitals:   11/12/23 0800  11/12/23 0850 11/12/23 0917 11/12/23 1145  BP:  98/63 (!) 137/95 93/64  Pulse: (!) 128  (!) 162 95  Resp: 18 18  17   Temp:    97.7 F (36.5 C)  TempSrc:    Oral  SpO2:    96%  Weight:        Intake/Output Summary (Last 24 hours) at 11/12/2023 1531 Last data filed at 11/12/2023 1100 Gross per 24 hour  Intake 360 ml  Output --  Net 360 ml      11/12/2023    5:05 AM 11/11/2023    9:38 AM 11/10/2023    4:56 PM  Last 3 Weights  Weight (lbs) 158 lb 8 oz 156 lb 4.9 oz 162 lb  Weight (kg) 71.895 kg 70.9 kg 73.483 kg     Body mass index is 26.38 kg/m.    Vitals:  Vitals:   11/12/23 0917 11/12/23 1145  BP: (!) 137/95 93/64  Pulse: (!) 162 95  Resp:  17  Temp:  97.7 F (36.5 C)  SpO2:  96%   General Appearance: In no apparent distress, sitting in bed  HEENT: Normocephalic, atraumatic.  Neck: Supple, trachea midline, no JVDs Cardiovascular: Regular rate and rhythm, normal S1-S2,  no murmur  Respiratory: Resting breathing unlabored, lungs sounds clear to auscultation bilaterally, no use of accessory muscles. On room air.  No wheezes, rales or rhonchi.   Gastrointestinal: Bowel sounds positive, abdomen soft, non-tender  Extremities: Able to move all extremities in bed without difficulty, no edema of BLE Musculoskeletal: Normal muscle bulk and tone Skin: Intact, warm, dry.  Neurologic: Alert, oriented to person, place and time. no cognitive deficit, limited insight  Psychiatric: anxious, hyperactive, pressured speech     EKG:  The EKG was personally reviewed and demonstrates:    EKG from 11/11/23 showed sinus tachycardia 105bpm, LVH, lateral T wave flattening   Telemetry:  Telemetry was personally reviewed and demonstrates:    Sinus tachycardia 100-130s, intermittent SVT up to 170s   Relevant CV Studies:  Echo from 11/11/23:  1. Left ventricular ejection fraction, by estimation, is 50 to 55%. The  left ventricle has low normal function. The left ventricle demonstrates   regional wall motion abnormalities (see scoring diagram/findings for  description). There  is mild left  ventricular hypertrophy of the basal-septal segment. Left ventricular  diastolic parameters were normal.   2. Right ventricular systolic function is normal. The right ventricular  size is normal.   3. The mitral valve is grossly normal. No evidence of mitral valve  regurgitation. No evidence of mitral stenosis.   4. The aortic valve is normal in structure. Aortic valve regurgitation is  not visualized. No aortic stenosis is present.   5. The inferior vena cava is normal in size with greater than 50%  respiratory variability, suggesting right atrial pressure of 3 mmHg.   Comparison(s): A prior study was performed on 06/09/2022. LVEF improved  from 30-35% to 50-55%.   ICD interrogation 09/23/23:   ICD Scheduled remote reviewed. Normal device function.  Presenting rhythm: VS  No new events since 6/12 clinic check.    Echo from 06/09/22:   1. Very technically difficult study, reduced echo windows despite  contrast, off-axis images   2. Left ventricular ejection fraction, by estimation, is 30 to 35%. The  left ventricle has moderately decreased function. The left ventricle  demonstrates global hypokinesis. There is moderate left ventricular  hypertrophy. Left ventricular diastolic  parameters are consistent with Grade I diastolic dysfunction (impaired  relaxation). There is severe akinesis of the left ventricular, mid-apical  anterior wall and anteroseptal wall.   3. Right ventricular systolic function is normal. The right ventricular  size is normal. There is normal pulmonary artery systolic pressure. The  estimated right ventricular systolic pressure is 16.5 mmHg.   4. Bubble study is inconclusive due to reduced echo windows.   5. The aortic valve is tricuspid. Aortic valve regurgitation is not  visualized.   6. The inferior vena cava is normal in size with greater than 50%   respiratory variability, suggesting right atrial pressure of 3 mmHg.   7. The mitral valve is grossly normal. No evidence of mitral valve  regurgitation.   Comparison(s): No significant change from prior study. 08/13/2018: LVEF  30-35%, apical akinesis, anteroseptal and mid-apical anterior myocardial  wma.   Cardiac MRI 10/12/15:   IMPRESSION: 1. Moderately dilated left ventricle with normal wall thickness and moderately to severely impaired systolic function (LVEF = 35%).   There is hypokinesis in the basal inferior and akinesis in the mid anterior, anteroseptal and apical anterior, septal and inferior walls.   There is a large partially mobile thrombus in the LV apex attached to the apical anterior wall measuring 42 x 35 x 32 mm.   Out of 7 myocardial segments affected by infarction there is preserved viability in the basal inferior and mid anterior walls with good prognosis if revascularized and fair prognosis in the apical inferior wall. There is a transmural infarct in the mid anteroseptal, apical septal, anterior walls and in the true apex with poor prognosis if revascularized.   2. Normal right ventricular size, thickness and systolic function (RVEF = 56%) with no regional wall motion abnormalities.   3.  Mild left atrial dilatation.   4. Normal size of the aortic root, ascending aorta and pulmonary artery.   5.  Mild mitral and trivial tricuspid regurgitation.      Laboratory Data: High Sensitivity Troponin:   Recent Labs  Lab 11/10/23 1703 11/10/23 1903 11/10/23 2201 11/11/23 0004  TROPONINIHS 34* 44* 68* 72*     Chemistry Recent Labs  Lab 11/10/23 1703 11/10/23 2201 11/11/23 0409 11/12/23 0225  NA 126* 126* 127* 131*  K 3.9 3.8 3.7 3.3*  CL 90* 91* 93* 98  CO2 17* 21* 21* 23  GLUCOSE 154* 117* 107* 108*  BUN 13 12 11 13   CREATININE 1.25* 1.03* 1.09* 0.91  CALCIUM  10.1 9.3 9.2 9.1  MG 1.3*  --  3.1*  --   GFRNONAA 52* >60 >60 >60  ANIONGAP  19* 14 13 10     Recent Labs  Lab 11/10/23 1703 11/11/23 0409  PROT 8.6* 7.2  ALBUMIN 3.9 3.3*  AST 26 21  ALT 14 15  ALKPHOS 66 52  BILITOT 0.8 1.1   Lipids No results for input(s): CHOL, TRIG, HDL, LABVLDL, LDLCALC, CHOLHDL in the last 168 hours.  Hematology Recent Labs  Lab 11/10/23 1703 11/11/23 0409 11/12/23 0225  WBC 7.9 7.3 6.8  RBC 3.95 3.29* 2.97*  HGB 11.8* 9.7* 8.8*  HCT 33.9* 27.6* 25.4*  MCV 85.8 83.9 85.5  MCH 29.9 29.5 29.6  MCHC 34.8 35.1 34.6  RDW 15.7* 15.7* 15.9*  PLT 254 191 143*   Thyroid   Recent Labs  Lab 11/11/23 0409  TSH 0.625    BNPNo results for input(s): BNP, PROBNP in the last 168 hours.  DDimer No results for input(s): DDIMER in the last 168 hours.  Radiology/Studies:  ECHOCARDIOGRAM COMPLETE Result Date: 11/11/2023    ECHOCARDIOGRAM REPORT   Patient Name:   MELISSIA LAHMAN Date of Exam: 11/11/2023 Medical Rec #:  [id number]          Height:       65.0 in Accession #:    [id number]         Weight:       156.3 lb Date of Birth:  Jan 06, 1971          BSA:          1.781 m Patient Age:    53 years           BP:           128/77 mmHg Patient Gender: F                  HR:           104 bpm. Exam Location:  Inpatient Procedure: 2D Echo, Color Doppler and Cardiac Doppler (Both Spectral and Color            Flow Doppler were utilized during procedure). Indications:    Syncope R55, Cardiomyopathy-Ischemic I25.5  History:        Patient has prior history of Echocardiogram examinations, most                 recent 06/09/2022.  Sonographer:    Tinnie Gosling RDCS Referring Phys: HADASSAH ALA  Sonographer Comments: Technically difficult study due to poor echo windows. IMPRESSIONS  1. Left ventricular ejection fraction, by estimation, is 50 to 55%. The left ventricle has low normal function. The left ventricle demonstrates regional wall motion abnormalities (see scoring diagram/findings for description). There is mild left ventricular  hypertrophy of the basal-septal segment. Left ventricular diastolic parameters were normal.  2. Right ventricular systolic function is normal. The right ventricular size is normal.  3. The mitral valve is grossly normal. No evidence of mitral valve regurgitation. No evidence of mitral stenosis.  4. The aortic valve is normal in structure. Aortic valve regurgitation is not visualized. No aortic stenosis is present.  5. The inferior vena cava is normal in size with greater than 50% respiratory variability, suggesting right atrial pressure of 3 mmHg. Comparison(s): A prior study was performed on  06/09/2022. LVEF improved from 30-35% to 50-55%. FINDINGS  Left Ventricle: Left ventricular ejection fraction, by estimation, is 50 to 55%. The left ventricle has low normal function. The left ventricle demonstrates regional wall motion abnormalities. The left ventricular internal cavity size was normal in size. There is mild left ventricular hypertrophy of the basal-septal segment. Left ventricular diastolic parameters were normal.  LV Wall Scoring: The inferior septum is hypokinetic. Right Ventricle: The right ventricular size is normal. Right vetricular wall thickness was not well visualized. Right ventricular systolic function is normal. Left Atrium: Left atrial size was normal in size. Right Atrium: Right atrial size was normal in size. Pericardium: There is no evidence of pericardial effusion. Mitral Valve: The mitral valve is grossly normal. No evidence of mitral valve regurgitation. No evidence of mitral valve stenosis. Tricuspid Valve: The tricuspid valve is grossly normal. Tricuspid valve regurgitation is not demonstrated. No evidence of tricuspid stenosis. Aortic Valve: The aortic valve is normal in structure. Aortic valve regurgitation is not visualized. No aortic stenosis is present. Pulmonic Valve: The pulmonic valve was normal in structure. Pulmonic valve regurgitation is not visualized. No evidence of pulmonic  stenosis. Aorta: The aortic root and ascending aorta are structurally normal, with no evidence of dilitation. Venous: The inferior vena cava is normal in size with greater than 50% respiratory variability, suggesting right atrial pressure of 3 mmHg. IAS/Shunts: The interatrial septum was not well visualized. Additional Comments: A device lead is visualized.  LEFT VENTRICLE PLAX 2D LVIDd:         4.70 cm   Diastology LVIDs:         2.70 cm   LV e' medial:    4.24 cm/s LV PW:         1.30 cm   LV E/e' medial:  13.7 LV IVS:        1.30 cm   LV e' lateral:   7.51 cm/s LVOT diam:     2.10 cm   LV E/e' lateral: 7.7 LV SV:         38 LV SV Index:   21 LVOT Area:     3.46 cm  RIGHT VENTRICLE         IVC TAPSE (M-mode): 1.2 cm  IVC diam: 1.50 cm LEFT ATRIUM         Index LA diam:    3.10 cm 1.74 cm/m  AORTIC VALVE LVOT Vmax:   88.60 cm/s LVOT Vmean:  51.000 cm/s LVOT VTI:    0.109 m  AORTA Ao Root diam: 2.80 cm Ao Asc diam:  2.70 cm MITRAL VALVE MV Area (PHT): 5.38 cm    SHUNTS MV Decel Time: 141 msec    Systemic VTI:  0.11 m MV E velocity: 58.20 cm/s  Systemic Diam: 2.10 cm MV A velocity: 72.80 cm/s MV E/A ratio:  0.80 Sunit Tolia Electronically signed by Madonna Large Signature Date/Time: 11/11/2023/2:36:15 PM    Final    EEG adult Result Date: 11/11/2023 Shelton Arlin KIDD, MD     11/11/2023 12:45 PM Patient Name: DENAE ZULUETA MRN: [id number] Epilepsy Attending: Arlin KIDD Shelton Referring Physician/Provider: Elnora Ip, MD Date: 11/11/2023 Duration: 24.10 mins Patient history: 53yo female with pertinent history of alcohol use disorder was found down outside her home by her family. Unclear history, however possible post-ictal state with reported confusion and decreased arousability by family and EMS. EEG to evaluate for seizure Level of alertness: Awake AEDs during EEG study: None Technical aspects: This EEG study was  done with scalp electrodes positioned according to the 10-20 International system of  electrode placement. Electrical activity was reviewed with band pass filter of 1-70Hz , sensitivity of 7 uV/mm, display speed of 71mm/sec with a 60Hz  notched filter applied as appropriate. EEG data were recorded continuously and digitally stored.  Video monitoring was available and reviewed as appropriate. Description: The posterior dominant rhythm consists of 8 Hz activity of moderate voltage (25-35 uV) seen predominantly in posterior head regions, symmetric and reactive to eye opening and eye closing. Hyperventilation did not show any EEG change.  Physiologic photic driving was not seen during photic stimulation. Of note, parts of study were technically difficult due to significant movement artifact. IMPRESSION: This technically difficult study is within normal limits. No seizures or epileptiform discharges were seen throughout the recording. A normal interictal EEG does not exclude the diagnosis of epilepsy. Arlin MALVA Krebs   CT Cervical Spine Wo Contrast Result Date: 11/10/2023 CLINICAL DATA:  Neck trauma, dangerous injury mechanism (Age 76-64y) EXAM: CT CERVICAL SPINE WITHOUT CONTRAST TECHNIQUE: Multidetector CT imaging of the cervical spine was performed without intravenous contrast. Multiplanar CT image reconstructions were also generated. RADIATION DOSE REDUCTION: This exam was performed according to the departmental dose-optimization program which includes automated exposure control, adjustment of the mA and/or kV according to patient size and/or use of iterative reconstruction technique. COMPARISON:  June 08, 2022 FINDINGS: Alignment: Normal alignment with appropriate cervical lordosis. No spondylolisthesis, uncovering of the facet joints, or significant widening of the spinous processes. No subluxation or abnormality identified at the craniovertebral junction. Skull base and vertebrae: Vertebral body heights are preserved. No acute fracture. No primary bone lesion or focal pathologic process.The  lateral masses of C1 are well aligned with C2. The odontoid is intact. Soft tissues and spinal canal: No prevertebral edema or soft tissue thickening. No visible canal hematoma. Disc levels: Mild intervertebral disc height loss at C4-C5 and C5-C6 with osteophyte formation. Circumferential disc bulges at these levels cause mild spinal canal narrowing. Upper chest: No focal airspace consolidation or pleural effusion. Other: Calcified atherosclerosis in the right carotid artery. IMPRESSION: 1. No acute fracture or traumatic malalignment of the cervical spine. 2. Mild degenerative disc disease at C4-C5 and C5-C6. Electronically Signed   By: Rogelia Myers M.D.   On: 11/10/2023 17:57   CT Head Wo Contrast Result Date: 11/10/2023 CLINICAL DATA:  Head trauma, abnormal mental status (Age 51-64y) EXAM: CT HEAD WITHOUT CONTRAST TECHNIQUE: Contiguous axial images were obtained from the base of the skull through the vertex without intravenous contrast. RADIATION DOSE REDUCTION: This exam was performed according to the departmental dose-optimization program which includes automated exposure control, adjustment of the mA and/or kV according to patient size and/or use of iterative reconstruction technique. COMPARISON:  June 09, 2022 FINDINGS: Brain: The ventricles appear age appropriate. No mass effect or midline shift. Gray-white differentiation is preserved without focal attenuation abnormality.No evidence of acute territorial infarction, extra-axial fluid collection, hemorrhage, or mass lesion. The basilar cisterns are patent without downward herniation. The cerebellar hemispheres and vermis are well formed without mass lesion or focal attenuation abnormality. Vascular: No hyperdense vessel. Skull: Normal. Negative for fracture or focal lesion. Sinuses/Orbits: The paranasal sinuses and mastoids are clear.The globes appear intact. No retrobulbar hematoma. Other: None. IMPRESSION: No acute intracranial abnormality,  specifically, no acute hemorrhage, territorial infarction, or intracranial mass. Electronically Signed   By: Rogelia Myers M.D.   On: 11/10/2023 17:48   DG Pelvis Portable Result Date: 11/10/2023 CLINICAL DATA:  Fall  EXAM: PORTABLE PELVIS 1-2 VIEWS COMPARISON:  None Available. FINDINGS: SI joints are non widened. Pubic symphysis and rami appear intact. Limited assessment of right femoral neck due to superimposition of trochanter and positioning. No dislocation IMPRESSION: Limited assessment of the right femoral neck due to positioning and superimposition of trochanter. No definite acute osseous abnormality. Cross-sectional imaging follow-up if persistent concern for pelvic or hip fracture Electronically Signed   By: Luke Bun M.D.   On: 11/10/2023 17:25   DG Chest Port 1 View Result Date: 11/10/2023 EXAM: 1 VIEW XRAY OF THE CHEST 11/10/2023 05:17:00 PM COMPARISON: 09/29/2018 CLINICAL HISTORY: Fall. BIB [facility] from home s/p unwitnessed sz-like activity with fall onto concrete. Initially unresponsive. Soaked of urine. No oral trauma. Unresponsive, followed by postictal on EMS arrival to scene. Gradually arousable to A\T\Ox4. Daughter at home and called EMS. Pt was taking the trash out at time of event. Unsure of hitting head. No wounds noted. Reportedly takes blood thinner and sz meds. FINDINGS: LUNGS AND PLEURA: No focal pulmonary opacity. No pulmonary edema. No pleural effusion. No pneumothorax. HEART AND MEDIASTINUM: Moderate cardiomegaly. No congestive heart failure. ICD takes a circuitous route over the right atrium, terminating over the right ventricle. BONES AND SOFT TISSUES: No acute osseous abnormality. Multiple wires and leads project over the chest on the frontal radiograph. IMPRESSION: 1. No acute process. 2. Moderate cardiomegaly without congestive heart failure. Electronically signed by: Rockey Kilts MD 11/10/2023 05:25 PM EDT RP Workstation: [id number]     Assessment and Plan:  Syncope   Paroxysmal SVT  - per ICD interrogation and telemetry  - likely exacerbated by electrolytes derangement and ETOH overuse/withdrawal  - she is not symptomatic, doubt this is the cause of her syncope, history fragmented but chart review overall suggestive of ETOH withdrawal seizure  - keep Mag >2 and K >4 , maintain adequate hydration - discussed ETOH cessation, she seems indifferent  - will increase Toprolol XL 100mg  to BID dosing   Ischemic cardiomyopathy with improved LVEF  CAD  - she has no chest pain  - see 2017 workup as summarized above  - Echo this admission with LVEF improved to 50-55%, WMA not new/present in the previous cMRI 2017 - discussed smoking cessation, she is indifferent  - no further ischemic workup indicated at this time, continue PTA GDMT with statin, zetia , and metoprolol , would resume Entresto  24/26mg  BID given resolved AKI, not on ASA due to use of Eliquis  historically, will start ASA 81mg  daily now given stopping Eliquis    Hx of LV thrombus  - Echo from 2020 and this admission showed resolved LV thrombus  - she has been on chronic anticoagulation with Eliquis  5mg  BID since 2017 to now.   Alcohol abuse with presumed withdrawal  Resolved AKI  - per primary team     Risk Assessment/Risk Scores:   [place]  Heart Association (NYHA) Functional Class NYHA Class I       For questions or updates, please contact [facility] Please consult [email or web address] for contact info under    Signed, Xika Zhao, NP  11/12/2023 3:31 PM  Personally seen and examined. Agree with above.  53 year old recently found on the ground next to a trash can confused unable to follow appropriate commands.  Has no recollection of the incident.  ICD interrogated-normal.  Brief paroxysmal SVT, likely atrial tachycardia.  This should not be the source of syncope.  She was experiencing this in the hospital setting without any change in mental status. -We will  go ahead and make  sure that she is on her Toprol -XL to 100 mg twice daily as she was taking at home. -She was quite adamant that we not change any of her medications without Dr. Vertie permission as this previously caused her to hallucinate her grandchildren in the room. -Prior history of LV thrombus which has resolved.  If she has been taking Eliquis  at home, continue it.  Her anterior apex still appears akinetic on echocardiogram.  EF is likely lower than recently estimated.  Alcohol use - Continue encourage cessation.  I do not believe that her current situation is cardiogenic. Resuming her Toprol  to 100 twice daily will hopefully help runs of SVT or atrial tachycardia. -No further ischemic evaluation warranted.  We understand her current anatomy.  Continue medical management.  Please let us  know if we can be of further assistance.  Oneil Parchment, MD

## 2023-11-12 NOTE — TOC CM/SW Note (Addendum)
 Transition of Care [facility]) - Inpatient Brief Assessment   Patient Details  Name: Casey [name] MRN: [id number] Date of Birth: 14-Apr-1970  Transition of Care [facility]) CM/SW Contact:    Waddell Barnie Rama, RN Phone Number: 11/12/2023, 4:35 PM   Clinical Narrative: From home alone, has PCP, Paliwal and insurance on file, states has no HH services in place at this time or DME at home.  States family member ( one of her children) will transport them home at [employer] and family is support system, states gets medications from [facility] delivery.  Pta self ambulatory.   There are no ICM  needs identified  at this time.  Please place consult for ICM  needs.  Does not want HHPT.   Transition of Care Asessment: Insurance and Status: Insurance coverage has been reviewed Patient has primary care physician: Yes Home environment has been reviewed: home alone Prior level of function:: indep Prior/Current Home Services: No current home services Social Drivers of Health Review: SDOH reviewed no interventions necessary Readmission risk has been reviewed: Yes Transition of care needs: no transition of care needs at this time

## 2023-11-13 DIAGNOSIS — I513 Intracardiac thrombosis, not elsewhere classified: Secondary | ICD-10-CM

## 2023-11-13 DIAGNOSIS — F1090 Alcohol use, unspecified, uncomplicated: Secondary | ICD-10-CM | POA: Diagnosis not present

## 2023-11-13 DIAGNOSIS — I255 Ischemic cardiomyopathy: Secondary | ICD-10-CM | POA: Diagnosis not present

## 2023-11-13 DIAGNOSIS — I5030 Unspecified diastolic (congestive) heart failure: Secondary | ICD-10-CM | POA: Diagnosis not present

## 2023-11-13 DIAGNOSIS — R55 Syncope and collapse: Secondary | ICD-10-CM | POA: Diagnosis not present

## 2023-11-13 DIAGNOSIS — F10129 Alcohol abuse with intoxication, unspecified: Secondary | ICD-10-CM | POA: Diagnosis not present

## 2023-11-13 DIAGNOSIS — I471 Supraventricular tachycardia, unspecified: Secondary | ICD-10-CM

## 2023-11-13 DIAGNOSIS — Z95810 Presence of automatic (implantable) cardiac defibrillator: Secondary | ICD-10-CM

## 2023-11-13 LAB — CBC
HCT: 25.4 % — ABNORMAL LOW (ref 36.0–46.0)
Hemoglobin: 8.7 g/dL — ABNORMAL LOW (ref 12.0–15.0)
MCH: 29.7 pg (ref 26.0–34.0)
MCHC: 34.3 g/dL (ref 30.0–36.0)
MCV: 86.7 fL (ref 80.0–100.0)
Platelets: 126 K/uL — ABNORMAL LOW (ref 150–400)
RBC: 2.93 MIL/uL — ABNORMAL LOW (ref 3.87–5.11)
RDW: 15.9 % — ABNORMAL HIGH (ref 11.5–15.5)
WBC: 5.9 K/uL (ref 4.0–10.5)
nRBC: 0 % (ref 0.0–0.2)

## 2023-11-13 LAB — BASIC METABOLIC PANEL WITH GFR
Anion gap: 8 (ref 5–15)
BUN: 13 mg/dL (ref 6–20)
CO2: 23 mmol/L (ref 22–32)
Calcium: 9.6 mg/dL (ref 8.9–10.3)
Chloride: 101 mmol/L (ref 98–111)
Creatinine, Ser: 0.83 mg/dL (ref 0.44–1.00)
GFR, Estimated: >60 mL/min (ref >60)
Glucose, Bld: 99 mg/dL (ref 70–99)
Potassium: 5.2 mmol/L — ABNORMAL HIGH (ref 3.5–5.1)
Sodium: 132 mmol/L — ABNORMAL LOW (ref 135–145)

## 2023-11-13 LAB — POTASSIUM: Potassium: 4.5 mmol/L (ref 3.5–5.1)

## 2023-11-13 MED ORDER — METOPROLOL SUCCINATE ER 100 MG PO TB24: 100.0000 mg | ORAL_TABLET | Freq: Two times a day (BID) | ORAL | Status: AC

## 2023-11-13 NOTE — Progress Notes (Addendum)
 Progress Note  Patient Name: Casey [name] Date of Encounter: 11/13/2023 [facility] Cardiologist: Redell Shallow, MD   Interval Summary   Sitting up in bed during interview.  Denies any chest pain, shortness of breath, or palpitations  Vital Signs Vitals:   11/12/23 1914 11/12/23 2359 11/13/23 0451 11/13/23 0819  BP: 120/67 130/79 125/74 118/74  Pulse: 93 88 83 81  Resp: 18 17 18 20   Temp: 98.8 F (37.1 C) 99.2 F (37.3 C) 99.5 F (37.5 C) (!) 97.4 F (36.3 C)  TempSrc: Oral Oral Oral Oral  SpO2: 100% 100% 99% 100%  Weight:   72.9 kg     Intake/Output Summary (Last 24 hours) at 11/13/2023 0838 Last data filed at 11/13/2023 0700 Gross per 24 hour  Intake 720 ml  Output 300 ml  Net 420 ml      11/13/2023    4:51 AM 11/12/2023    5:05 AM 11/11/2023    9:38 AM  Last 3 Weights  Weight (lbs) 160 lb 11.2 oz 158 lb 8 oz 156 lb 4.9 oz  Weight (kg) 72.893 kg 71.895 kg 70.9 kg      Telemetry/ECG  Heart rates overnight in the 70s to 90s this is a decrease from previous rates- Personally Reviewed  Physical Exam  GEN: No acute distress.  Alert and oriented on room air. Neck: No JVD Cardiac: RRR, no murmurs, rubs, or gallops.  Respiratory: Clear to auscultation bilaterally. GI: Soft, nontender, non-distended  MS: No edema  Assessment & Plan  PRENTISS HAMMETT is a 53 y.o. female with a hx of ischemic cardiomyopathy, HFrEF with improved LVEF, CAD, NSVT, HTN, HLD, LV mural thrombus, tobacco use, ETOH abuse,  who is being seen 11/12/2023 for the evaluation of syncope at the request of Dr Lovie.   Syncope versus seizure Paroxysmal SVT  Hypertension Per ICD interrogation and telemetry  Syncope likely exacerbated by electrolytes derangement and ETOH overuse/withdrawal  It is unlikely that syncope is cardiogenic in nature.  Tachycardia has improved with restarting beta-blocker.  Patient's worsening anemia with a hemoglobin of 8.7 may also be contributing to  tachycardia. Most recent potassium 5.2 and magnesium  3.1.  Keep Mag >2 and K >4 , maintain adequate hydration Continue Toprolol XL 100mg  to BID dosing    Ischemic cardiomyopathy with improved LVEF s/p ICD in 2020 CAD  Prior proximal LAD dissection Hyperlipidemia She has no chest pain  Had NSTEMI in 2017 and cath showed 100% stenosis in the ostial RCA, 20% stenosis in the proximal circumflex, 50% stenosis in the proximal LAD, 55% stenosis in the mid LAD, and 70% stenosis in the ostial ramus. Echos from 2017 to 2020 showed a reduced LVEF of 30-35%. Echo this admission with LVEF improved to 50-55%, WMA not new/present in the previous cMRI 2017 No further ischemic workup indicated at this time.  As patient expressed concerns of changing medications without Dr. Shallow we will schedule outpatient follow-up with Dr. Shallow. Continue atorvastatin  80 mg daily Continue Zetia  10 mg daily GDMT -Home GDMT was restarted as AKI has resolved. Continue Entresto  24-26 twice daily Continue Toprol  XL 100 mg twice daily    Hx of LV thrombus  Echo from 2020 and this admission showed resolved LV thrombus. she has been on chronic anticoagulation with Eliquis  5mg  BID since 2017 to now.  Continues to have wall motion abnormalities on echo so we will continue Eliquis . Continue Eliquis  5 mg twice daily   Alcohol abuse with presumed withdrawal  Tobacco  abuse Resolved AKI  Normocytic anemia - per primary team  [facility] will sign off.   The patient is ready for discharge today from a cardiac standpoint. Medication Recommendations:  as above Other recommendations (labs, testing, etc):  none Follow up as an outpatient: Scheduled follow-up with Dr. Pietro per patient preference the soonest I could get was on 02/13/2024.  For questions or updates, please contact [facility] Please consult [email or web address] for contact info under       Signed, Morse Clause, PA-C   Continue with the  Eliquis  5 mg twice a day even though there is no evidence of left ventricular thrombus on current echocardiogram.  Distal windows are quite challenging to see.  She is at risk for further thrombotic activity.  Continue to work with alcohol cessation. -Excellent goal-directed medical therapy as above.  Will sign off.  Thank you.  Oneil Parchment, MD

## 2023-11-13 NOTE — Plan of Care (Signed)
  Problem: Education: Goal: Knowledge of General Education information will improve Description: Including pain rating scale, medication(s)/side effects and non-pharmacologic comfort measures Outcome: Progressing   Problem: Clinical Measurements: Goal: Ability to maintain clinical measurements within normal limits will improve Outcome: Progressing   Problem: Clinical Measurements: Goal: Will remain free from infection Outcome: Progressing   Problem: Nutrition: Goal: Adequate nutrition will be maintained Outcome: Progressing   Problem: Coping: Goal: Level of anxiety will decrease Outcome: Progressing

## 2023-11-13 NOTE — Progress Notes (Signed)
 Mobility Specialist Progress Note:   11/13/23 1005  Mobility  Activity Ambulated with assistance  Level of Assistance Standby assist, set-up cues, supervision of patient - no hands on  Assistive Device None  Distance Ambulated (ft) 300 ft  Activity Response Tolerated well  Mobility Referral Yes  Mobility visit 1 Mobility  Mobility Specialist Start Time (ACUTE ONLY) 1005  Mobility Specialist Stop Time (ACUTE ONLY) 1015  Mobility Specialist Time Calculation (min) (ACUTE ONLY) 10 min   Pt agreeable to mobility session. HR much improved since yesterday, maintain in 110s throughout ambulation. Pt asx, back in bed with all needs met.   Therisa Rana Mobility Specialist Please contact via SecureChat or  Rehab office at [phone]

## 2023-11-13 NOTE — TOC Transition Note (Addendum)
 Transition of Care [facility]) - Discharge Note   Patient Details  Name: Casey [name] MRN: [id number] Date of Birth: May 04, 1970  Transition of Care [facility]) CM/SW Contact:  Waddell Barnie Rama, RN Phone Number: 11/13/2023, 2:47 PM   Clinical Narrative:    For dc today, she does not want HHPT, she has transport home.  She states Paliwal is her PCP.     Patient Goals and CMS Choice            Discharge Placement                       Discharge Plan and Services Additional resources added to the After Visit Summary for                                       Social Drivers of Health (SDOH) Interventions SDOH Screenings   Depression (PHQ2-9): Low Risk  (07/27/2021)  Tobacco Use: High Risk (11/10/2023)     Readmission Risk Interventions    11/12/2023    4:34 PM  Readmission Risk Prevention Plan  Medication Screening Complete  Transportation Screening Complete

## 2023-12-23 ENCOUNTER — Ambulatory Visit (INDEPENDENT_AMBULATORY_CARE_PROVIDER_SITE_OTHER): Payer: Medicare HMO

## 2023-12-23 DIAGNOSIS — I255 Ischemic cardiomyopathy: Secondary | ICD-10-CM

## 2023-12-24 LAB — CUP PACEART REMOTE DEVICE CHECK
Battery Remaining Longevity: 126 mo
Battery Remaining Percentage: 88 %
Brady Statistic RV Percent Paced: 0 %
Date Time Interrogation Session: 20250922002100
HighPow Impedance: 74 Ohm
Implantable Lead Connection Status: 753985
Implantable Lead Implant Date: 20200629
Implantable Lead Location: 753860
Implantable Lead Model: 292
Implantable Lead Serial Number: 448526
Implantable Pulse Generator Implant Date: 20200629
Lead Channel Impedance Value: 473 Ohm
Lead Channel Pacing Threshold Amplitude: 0.9 V
Lead Channel Pacing Threshold Pulse Width: 0.4 ms
Lead Channel Setting Pacing Amplitude: 2 V
Lead Channel Setting Pacing Pulse Width: 0.4 ms
Lead Channel Setting Sensing Sensitivity: 0.6 mV
Pulse Gen Serial Number: 262129
Zone Setting Status: 755011

## 2023-12-24 NOTE — Progress Notes (Signed)
Remote ICD Transmission.

## 2023-12-29 ENCOUNTER — Ambulatory Visit: Payer: Self-pay | Admitting: Internal Medicine

## 2024-02-07 NOTE — Progress Notes (Deleted)
 HPI: FU coronary artery disease. Patient previously admitted with abdominal pain. Abdominal CT showed thrombus at the LV apex. Echocardiogram May 2017 showed ejection fraction 35-40% with anterior and apical wall motion abnormality, grade 2 diastolic dysfunction; thrombus at the apex, mild mitral regurgitation and mild left atrial enlargement. Cardiac catheterization May 2017 showed a 90% proximal to mid LAD, 60% ramus and 50% right coronary artery. It was felt that she had likely infarcted the LAD territory and medical therapy recommended unless she has recurrent symptoms. She was placed on apixaban . Cardiac MRI July 2017 showed ejection fraction 35%. There was a large apical thrombus. There was mild mitral regurgitation.  Patient seen by Dr. Dusty October 2017. Medical therapy recommended. There has been problems with compliance.  Had ICD implanted June 2020.  CTA March 2024 showed no intracranial large vessel occlusion or significant stenosis.  ABIs October 2024 showed critical limb ischemia on the right and left.  Echocardiogram August 2025 showed ejection fraction 50 to 55%, mild left ventricular hypertrophy.  Since last seen,   Current Outpatient Medications  Medication Sig Dispense Refill   atorvastatin  (LIPITOR ) 80 MG tablet TAKE 1 TABLET EVERY DAY (Patient taking differently: Take 80 mg by mouth 2 (two) times daily.) 90 tablet 3   ELIQUIS  5 MG TABS tablet TAKE 1 TABLET TWICE DAILY 180 tablet 3   ENTRESTO  24-26 MG TAKE 1 TABLET TWICE DAILY 180 tablet 3   ezetimibe  (ZETIA ) 10 MG tablet TAKE 1 TABLET EVERY DAY (Patient taking differently: Take 10 mg by mouth 2 (two) times daily.) 90 tablet 3   metoprolol  succinate (TOPROL -XL) 100 MG 24 hr tablet Take 1 tablet (100 mg total) by mouth 2 (two) times daily.     pantoprazole  (PROTONIX ) 40 MG tablet TAKE 1 TABLET EVERY DAY (Patient not taking: Reported on 11/10/2023) 90 tablet 3   No current facility-administered medications for this visit.      Past Medical History:  Diagnosis Date   Anticoagulation adequate 10/14/2015   CAD (coronary artery disease)    Coronary atherosclerosis of native coronary artery, multivessel with plans for CABG to be followed   08/04/2015   Ischemic cardiomyopathy    Peripheral arterial disease    UTI (urinary tract infection) 10/14/2015    Past Surgical History:  Procedure Laterality Date   CARDIAC CATHETERIZATION N/A 08/04/2015   Procedure: Coronary/Graft Angiography;  Surgeon: Peter M Jordan, MD;  Location[facility] INVASIVE CV LAB;  Service: Cardiovascular;  Laterality: N/A;   CARDIAC CATHETERIZATION N/A 10/10/2015   Procedure: Left Heart Cath and Coronary Angiography;  Surgeon: Debby DELENA Sor, MD;  Location: [facility] INVASIVE CV LAB;  Service: Cardiovascular;  Laterality: N/A;   CESAREAN SECTION     ICD IMPLANT N/A 09/29/2018   Procedure: ICD IMPLANT;  Surgeon: Waddell Danelle ORN, MD;  Location: [facility] INVASIVE CV LAB;  Service: Cardiovascular;  Laterality: N/A;    Social History   Socioeconomic History   Marital status: Divorced    Spouse name: Not on file   Number of children: Not on file   Years of education: 9th grade   Highest education level: Not on file  Occupational History   Not on file  Tobacco Use   Smoking status: Some Days    Current packs/day: 0.50    Average packs/day: 0.5 packs/day for 25.0 years (12.5 ttl pk-yrs)    Types: Cigarettes   Smokeless tobacco: Never  Vaping Use   Vaping status: Never Used  Substance and Sexual Activity  Alcohol use: Yes    Alcohol/week: 0.0 standard drinks of alcohol    Comment: Two drinks a night.   Drug use: No    Comment: No history of cocaine use   Sexual activity: Not on file  Other Topics Concern   Not on file  Social History Narrative   Not on file   Social Drivers of Health   Financial Resource Strain: Not on file  Food Insecurity: Not on file  Transportation Needs: Not on file  Physical Activity: Not on file  Stress: Not on file   Social Connections: Not on file  Intimate Partner Violence: Not on file    Family History  Problem Relation Age of Onset   Diabetes Mellitus II Neg Hx    CAD Neg Hx    Breast cancer Neg Hx     ROS: no fevers or chills, productive cough, hemoptysis, dysphasia, odynophagia, melena, hematochezia, dysuria, hematuria, rash, seizure activity, orthopnea, PND, pedal edema, claudication. Remaining systems are negative.  Physical Exam: Well-developed well-nourished in no acute distress.  Skin is warm and dry.  HEENT is normal.  Neck is supple.  Chest is clear to auscultation with normal expansion.  Cardiovascular exam is regular rate and rhythm.  Abdominal exam nontender or distended. No masses palpated. Extremities show no edema. neuro grossly intact  ECG- personally reviewed  A/P  1 coronary artery disease-patient is not having chest pain.  Continue statin.  2 ischemic cardiomyopathy-continue Entresto  and beta-blocker.  3 history of left ventricular apical thrombus-continue apixaban .  4 hyperlipidemia-continue statin and zetia .  5 peripheral vascular disease-  6 ICD-managed by electrophysiology.  7 tobacco abuse-patient counseled on discontinuing.  8 hypertension-patient's blood pressure is controlled.  Continue present medical regimen.  Redell Shallow, MD

## 2024-02-13 ENCOUNTER — Ambulatory Visit: Attending: Cardiology | Admitting: Cardiology

## 2024-02-26 ENCOUNTER — Other Ambulatory Visit: Payer: Self-pay | Admitting: Cardiology

## 2024-03-23 ENCOUNTER — Ambulatory Visit: Payer: Medicare HMO

## 2024-03-23 DIAGNOSIS — I255 Ischemic cardiomyopathy: Secondary | ICD-10-CM

## 2024-03-24 LAB — CUP PACEART REMOTE DEVICE CHECK
Battery Remaining Longevity: 126 mo
Battery Remaining Percentage: 88 %
Brady Statistic RV Percent Paced: 0 %
Date Time Interrogation Session: 20251222002100
HighPow Impedance: 62 Ohm
Implantable Lead Connection Status: 753985
Implantable Lead Implant Date: 20200629
Implantable Lead Location: 753860
Implantable Lead Model: 292
Implantable Lead Serial Number: 448526
Implantable Pulse Generator Implant Date: 20200629
Lead Channel Impedance Value: 454 Ohm
Lead Channel Pacing Threshold Amplitude: 0.9 V
Lead Channel Pacing Threshold Pulse Width: 0.4 ms
Lead Channel Setting Pacing Amplitude: 2 V
Lead Channel Setting Pacing Pulse Width: 0.4 ms
Lead Channel Setting Sensing Sensitivity: 0.6 mV
Pulse Gen Serial Number: 262129
Zone Setting Status: 755011

## 2024-03-25 NOTE — Progress Notes (Signed)
 Remote ICD Transmission

## 2024-03-29 ENCOUNTER — Other Ambulatory Visit: Payer: Self-pay | Admitting: Cardiology

## 2024-04-05 ENCOUNTER — Ambulatory Visit: Payer: Self-pay | Admitting: Cardiology

## 2024-04-13 IMAGING — CT CT HEAD W/O CM
4 series · 16 of 47 positions shown, 18 images · non-contrast
Comparison: CT head 07/09/2021

CLINICAL DATA: Delirium



[Series 2: head wo · axial · 0.38mm/px · z∈[-245,-135]mm · 7 of 30 slices shown, 9 images]
[im 4/30  brain]
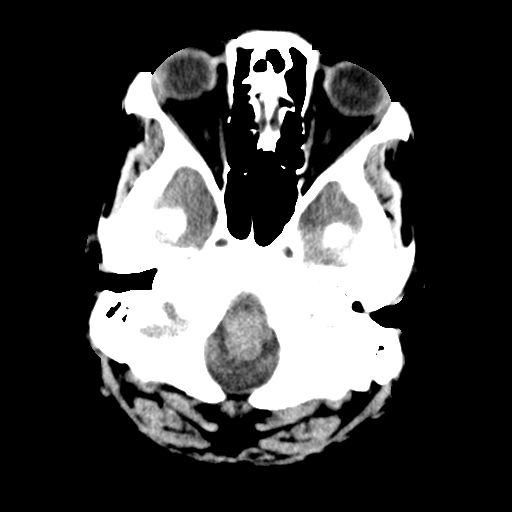
[im 4/30  bone]
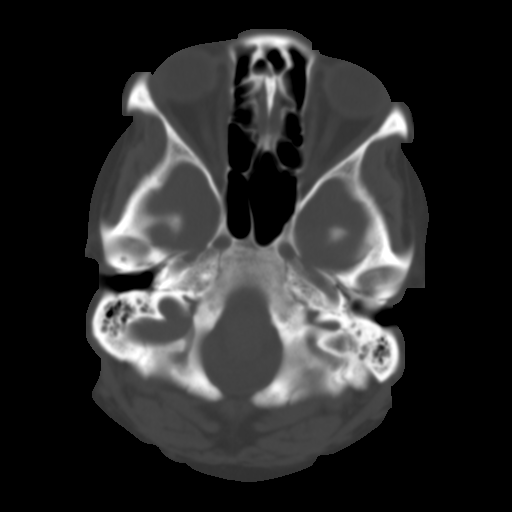
[im 8/30  brain]
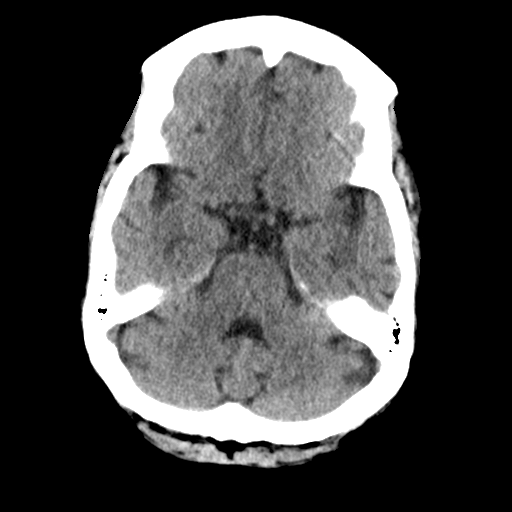
[im 11/30  brain]
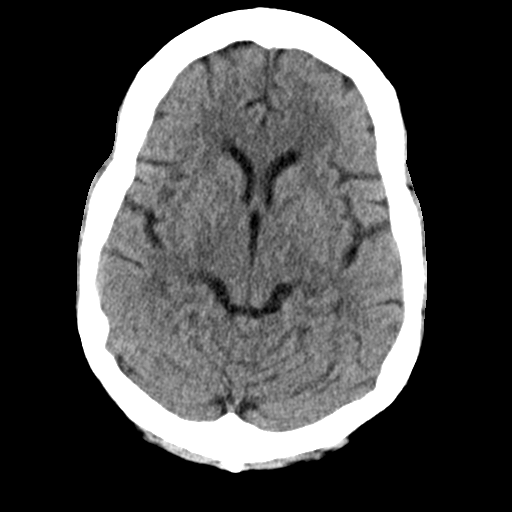
[im 15/30  brain]
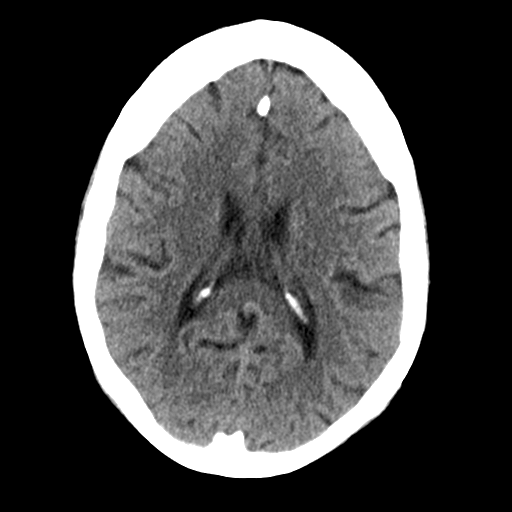
[im 19/30  brain]
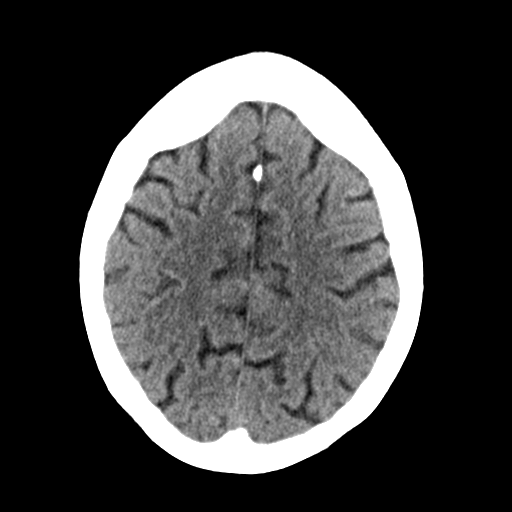
[im 19/30  bone]
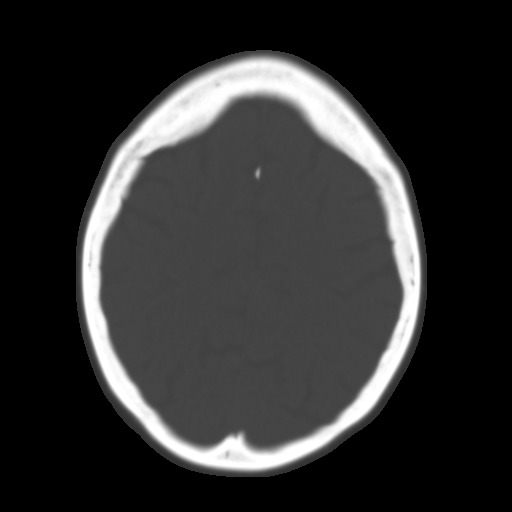
[im 22/30  brain]
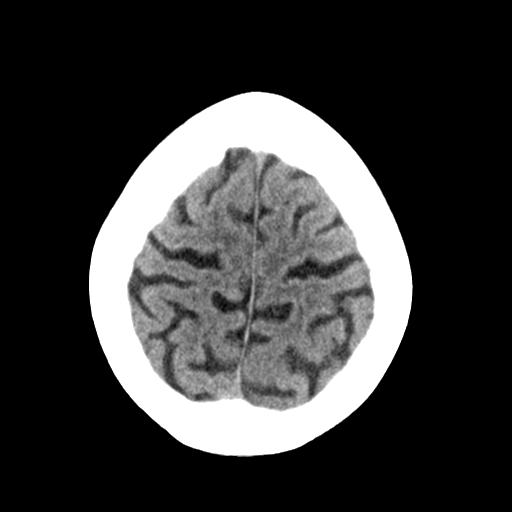
[im 26/30  brain]
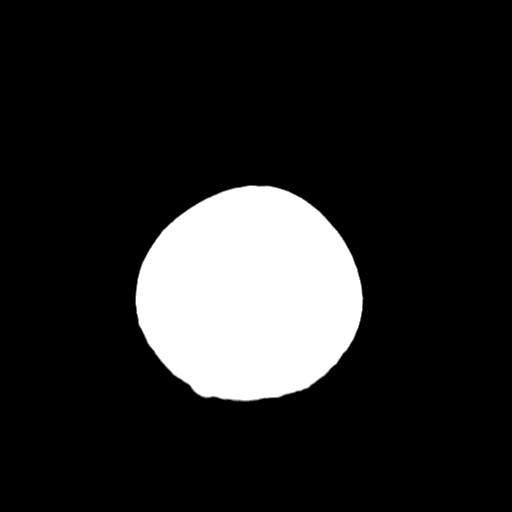

[Series 3: head bone · axial · 0.38mm/px · z∈[-246,-218]mm · 3 of 74 slices shown]
[im 8/74  bone]
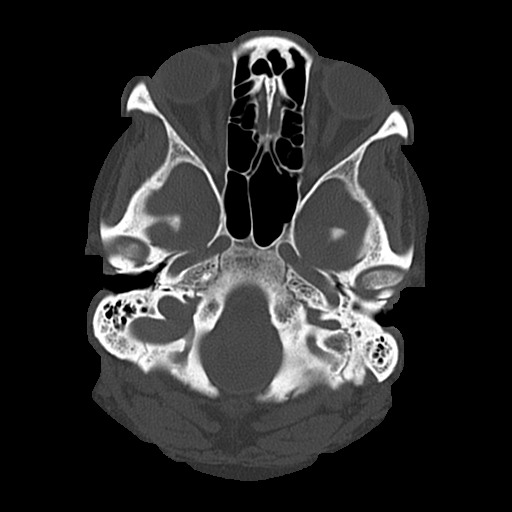
[im 15/74  bone]
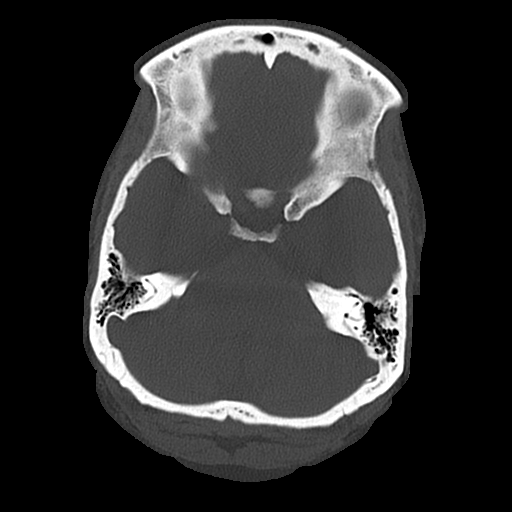
[im 22/74  bone]
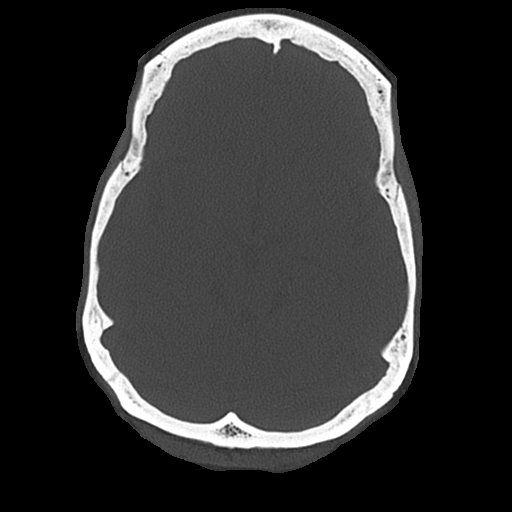

[Series 4: coronal soft · coronal · 0.31mm/px · 3 of 68 slices shown]
[im 23/68  brain]
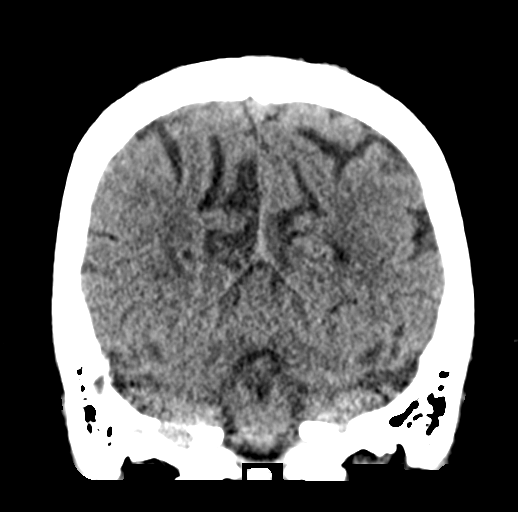
[im 30/68  brain]
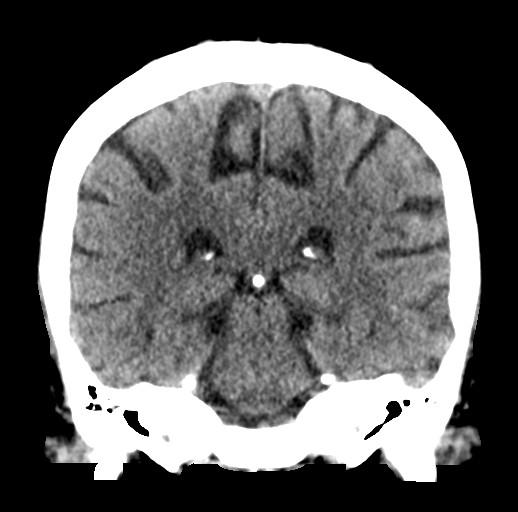
[im 38/68  brain]
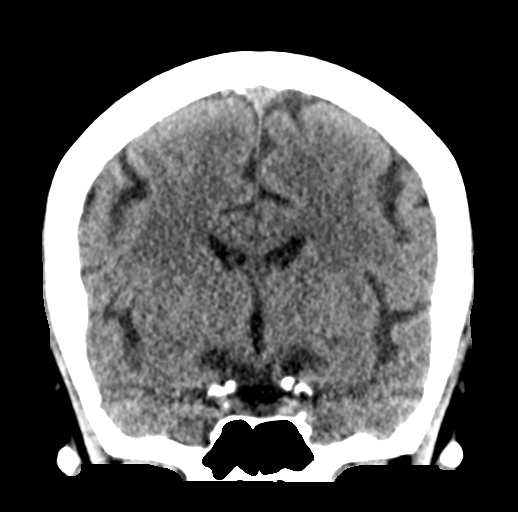

[Series 5: sagittal soft · sagittal · 0.29mm/px · 3 of 54 slices shown]
[im 18/54  brain]
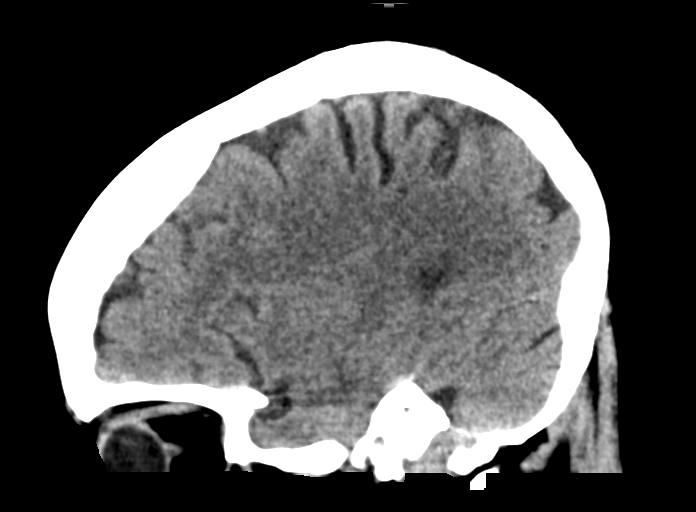
[im 27/54  brain]
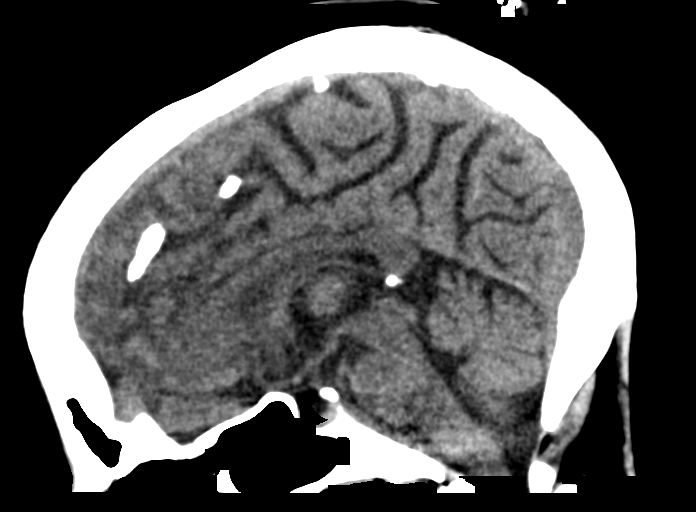
[im 36/54  brain]
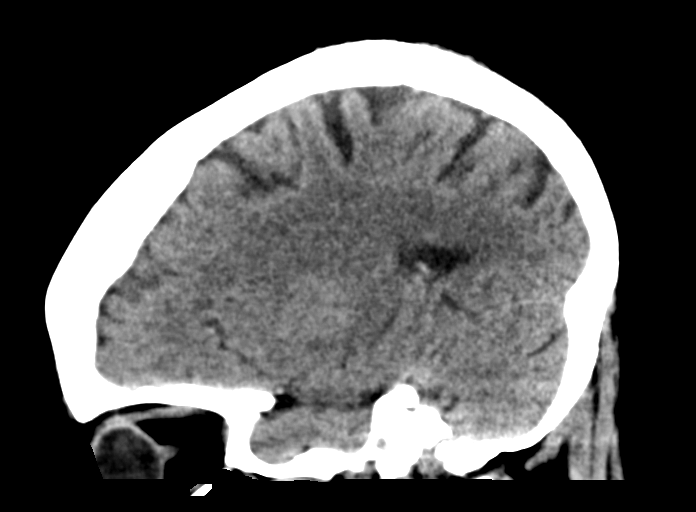

[16 of 47 positions shown; findings below may reference images not displayed]

FINDINGS: Brain:

No evidence of large-territorial acute infarction. No parenchymal
hemorrhage. No mass lesion. No extra-axial collection.

No mass effect or midline shift. No hydrocephalus. Basilar cisterns
are patent.

Vascular: No hyperdense vessel.

Skull: No acute fracture or focal lesion.

Sinuses/Orbits: Paranasal sinuses and mastoid air cells are clear.
The orbits are unremarkable.

Other: None.
IMPRESSION: No acute intracranial abnormality.

## 2024-05-11 ENCOUNTER — Ambulatory Visit: Admitting: Pulmonary Disease
